# Patient Record
Sex: Female | Born: 1935 | Race: White | Hispanic: No | Marital: Married | Smoking: Never smoker
Health system: Southern US, Community
[De-identification: ages and names within clinical notes are randomized; demographics above are authoritative.]

## PROBLEM LIST (undated history)

## (undated) DIAGNOSIS — R040 Epistaxis: Secondary | ICD-10-CM

## (undated) DIAGNOSIS — F39 Unspecified mood [affective] disorder: Secondary | ICD-10-CM

## (undated) DIAGNOSIS — I82401 Acute embolism and thrombosis of unspecified deep veins of right lower extremity: Secondary | ICD-10-CM

## (undated) DIAGNOSIS — G4730 Sleep apnea, unspecified: Secondary | ICD-10-CM

## (undated) DIAGNOSIS — T4145XA Adverse effect of unspecified anesthetic, initial encounter: Secondary | ICD-10-CM

## (undated) DIAGNOSIS — M6281 Muscle weakness (generalized): Secondary | ICD-10-CM

## (undated) DIAGNOSIS — I674 Hypertensive encephalopathy: Principal | ICD-10-CM

## (undated) DIAGNOSIS — I10 Essential (primary) hypertension: Secondary | ICD-10-CM

## (undated) DIAGNOSIS — T8859XA Other complications of anesthesia, initial encounter: Secondary | ICD-10-CM

## (undated) DIAGNOSIS — K7581 Nonalcoholic steatohepatitis (NASH): Secondary | ICD-10-CM

## (undated) DIAGNOSIS — M419 Scoliosis, unspecified: Secondary | ICD-10-CM

## (undated) DIAGNOSIS — R51 Headache: Secondary | ICD-10-CM

## (undated) DIAGNOSIS — I739 Peripheral vascular disease, unspecified: Secondary | ICD-10-CM

## (undated) DIAGNOSIS — H9192 Unspecified hearing loss, left ear: Secondary | ICD-10-CM

## (undated) DIAGNOSIS — G8929 Other chronic pain: Secondary | ICD-10-CM

## (undated) DIAGNOSIS — G4700 Insomnia, unspecified: Secondary | ICD-10-CM

## (undated) DIAGNOSIS — K219 Gastro-esophageal reflux disease without esophagitis: Secondary | ICD-10-CM

## (undated) DIAGNOSIS — I25119 Atherosclerotic heart disease of native coronary artery with unspecified angina pectoris: Secondary | ICD-10-CM

## (undated) DIAGNOSIS — R0602 Shortness of breath: Secondary | ICD-10-CM

## (undated) DIAGNOSIS — I639 Cerebral infarction, unspecified: Secondary | ICD-10-CM

## (undated) DIAGNOSIS — F419 Anxiety disorder, unspecified: Secondary | ICD-10-CM

## (undated) DIAGNOSIS — M549 Dorsalgia, unspecified: Secondary | ICD-10-CM

## (undated) DIAGNOSIS — H9191 Unspecified hearing loss, right ear: Secondary | ICD-10-CM

## (undated) DIAGNOSIS — K7460 Unspecified cirrhosis of liver: Secondary | ICD-10-CM

## (undated) DIAGNOSIS — G43909 Migraine, unspecified, not intractable, without status migrainosus: Secondary | ICD-10-CM

## (undated) DIAGNOSIS — Z8619 Personal history of other infectious and parasitic diseases: Secondary | ICD-10-CM

## (undated) DIAGNOSIS — F600 Paranoid personality disorder: Secondary | ICD-10-CM

## (undated) DIAGNOSIS — N289 Disorder of kidney and ureter, unspecified: Secondary | ICD-10-CM

## (undated) DIAGNOSIS — R0609 Other forms of dyspnea: Secondary | ICD-10-CM

## (undated) DIAGNOSIS — M47816 Spondylosis without myelopathy or radiculopathy, lumbar region: Secondary | ICD-10-CM

## (undated) DIAGNOSIS — R1310 Dysphagia, unspecified: Secondary | ICD-10-CM

## (undated) DIAGNOSIS — K759 Inflammatory liver disease, unspecified: Secondary | ICD-10-CM

## (undated) DIAGNOSIS — I671 Cerebral aneurysm, nonruptured: Secondary | ICD-10-CM

## (undated) DIAGNOSIS — R0600 Dyspnea, unspecified: Secondary | ICD-10-CM

## (undated) DIAGNOSIS — Z8719 Personal history of other diseases of the digestive system: Secondary | ICD-10-CM

## (undated) DIAGNOSIS — H47099 Other disorders of optic nerve, not elsewhere classified, unspecified eye: Secondary | ICD-10-CM

## (undated) DIAGNOSIS — R440 Auditory hallucinations: Secondary | ICD-10-CM

## (undated) DIAGNOSIS — F1320 Sedative, hypnotic or anxiolytic dependence, uncomplicated: Secondary | ICD-10-CM

## (undated) DIAGNOSIS — Z950 Presence of cardiac pacemaker: Secondary | ICD-10-CM

## (undated) HISTORY — PX: KNEE ARTHROSCOPY: SHX127

---
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
## 1968-12-07 DIAGNOSIS — I82401 Acute embolism and thrombosis of unspecified deep veins of right lower extremity: Secondary | ICD-10-CM

## 1968-12-07 HISTORY — DX: Acute embolism and thrombosis of unspecified deep veins of right lower extremity: I82.401

## 1968-12-07 HISTORY — PX: DILATION AND CURETTAGE OF UTERUS: SHX78

## 1978-12-08 HISTORY — PX: VAGINAL HYSTERECTOMY: SUR661

## 2004-02-24 ENCOUNTER — Ambulatory Visit: Payer: Self-pay | Admitting: Family Medicine

## 2004-04-05 ENCOUNTER — Ambulatory Visit: Payer: Self-pay | Admitting: *Deleted

## 2004-05-01 ENCOUNTER — Ambulatory Visit: Payer: Self-pay | Admitting: Family Medicine

## 2004-06-26 ENCOUNTER — Ambulatory Visit: Payer: Self-pay | Admitting: Family Medicine

## 2004-07-04 ENCOUNTER — Ambulatory Visit: Payer: Self-pay | Admitting: Family Medicine

## 2004-08-20 ENCOUNTER — Ambulatory Visit: Payer: Self-pay | Admitting: Family Medicine

## 2004-11-05 ENCOUNTER — Ambulatory Visit: Payer: Self-pay | Admitting: Family Medicine

## 2005-01-01 ENCOUNTER — Ambulatory Visit: Payer: Self-pay | Admitting: Family Medicine

## 2005-01-25 ENCOUNTER — Ambulatory Visit: Payer: Self-pay | Admitting: Family Medicine

## 2005-03-12 ENCOUNTER — Ambulatory Visit: Payer: Self-pay | Admitting: Family Medicine

## 2005-07-11 ENCOUNTER — Ambulatory Visit: Payer: Self-pay | Admitting: Family Medicine

## 2005-07-22 ENCOUNTER — Ambulatory Visit: Payer: Self-pay | Admitting: Family Medicine

## 2005-11-28 ENCOUNTER — Ambulatory Visit: Payer: Self-pay | Admitting: Family Medicine

## 2006-01-31 ENCOUNTER — Ambulatory Visit: Payer: Self-pay | Admitting: Family Medicine

## 2006-02-07 ENCOUNTER — Ambulatory Visit: Payer: Self-pay | Admitting: Family Medicine

## 2006-06-25 ENCOUNTER — Ambulatory Visit: Payer: Self-pay | Admitting: Family Medicine

## 2006-07-04 ENCOUNTER — Ambulatory Visit (HOSPITAL_COMMUNITY): Admission: RE | Admit: 2006-07-04 | Discharge: 2006-07-04 | Payer: Self-pay | Admitting: Family Medicine

## 2006-08-11 ENCOUNTER — Ambulatory Visit: Payer: Self-pay | Admitting: Family Medicine

## 2010-05-09 DIAGNOSIS — Z8619 Personal history of other infectious and parasitic diseases: Secondary | ICD-10-CM

## 2010-05-09 HISTORY — DX: Personal history of other infectious and parasitic diseases: Z86.19

## 2010-05-28 ENCOUNTER — Inpatient Hospital Stay (HOSPITAL_COMMUNITY): Payer: Medicare Other

## 2010-05-28 ENCOUNTER — Emergency Department (HOSPITAL_COMMUNITY): Payer: Medicare Other

## 2010-05-28 ENCOUNTER — Inpatient Hospital Stay (HOSPITAL_COMMUNITY)
Admission: EM | Admit: 2010-05-28 | Discharge: 2010-06-01 | DRG: 305 | Disposition: A | Discharge status: Home / Self Care | Payer: Medicare Other | Attending: Family Medicine | Admitting: Family Medicine

## 2010-05-28 ENCOUNTER — Encounter (HOSPITAL_COMMUNITY): Payer: Self-pay

## 2010-05-28 DIAGNOSIS — G8929 Other chronic pain: Secondary | ICD-10-CM | POA: Diagnosis present

## 2010-05-28 DIAGNOSIS — R350 Frequency of micturition: Secondary | ICD-10-CM | POA: Diagnosis present

## 2010-05-28 DIAGNOSIS — R112 Nausea with vomiting, unspecified: Secondary | ICD-10-CM | POA: Diagnosis present

## 2010-05-28 DIAGNOSIS — I10 Essential (primary) hypertension: Principal | ICD-10-CM | POA: Diagnosis present

## 2010-05-28 DIAGNOSIS — K219 Gastro-esophageal reflux disease without esophagitis: Secondary | ICD-10-CM | POA: Diagnosis present

## 2010-05-28 DIAGNOSIS — B029 Zoster without complications: Secondary | ICD-10-CM | POA: Diagnosis present

## 2010-05-28 DIAGNOSIS — M545 Low back pain, unspecified: Secondary | ICD-10-CM | POA: Diagnosis present

## 2010-05-28 HISTORY — DX: Other chronic pain: G89.29

## 2010-05-28 HISTORY — DX: Dorsalgia, unspecified: M54.9

## 2010-05-28 HISTORY — DX: Essential (primary) hypertension: I10

## 2010-05-28 LAB — URINALYSIS, ROUTINE W REFLEX MICROSCOPIC
Bilirubin Urine: NEGATIVE
Hgb urine dipstick: NEGATIVE
Ketones, ur: NEGATIVE mg/dL
Urine Glucose, Fasting: NEGATIVE mg/dL
Urobilinogen, UA: 0.2 mg/dL (ref 0.0–1.0)
pH: 7.5 (ref 5.0–8.0)

## 2010-05-28 LAB — URINE MICROSCOPIC-ADD ON

## 2010-05-28 LAB — BASIC METABOLIC PANEL
Glucose, Bld: 118 mg/dL — ABNORMAL HIGH (ref 70–99)
Potassium: 4.2 mEq/L (ref 3.5–5.1)
Sodium: 138 mEq/L (ref 135–145)

## 2010-05-28 LAB — HEPATIC FUNCTION PANEL
AST: 29 U/L (ref 0–37)
Alkaline Phosphatase: 78 U/L (ref 39–117)
Indirect Bilirubin: 1.4 mg/dL — ABNORMAL HIGH (ref 0.3–0.9)

## 2010-05-28 LAB — CBC
HCT: 44.8 % (ref 36.0–46.0)
MCHC: 33 g/dL (ref 30.0–36.0)
RDW: 14.2 % (ref 11.5–15.5)

## 2010-05-28 LAB — APTT: aPTT: 30 seconds (ref 24–37)

## 2010-05-28 LAB — MRSA PCR SCREENING: MRSA by PCR: NEGATIVE

## 2010-05-29 LAB — CBC
HCT: 38.2 % (ref 36.0–46.0)
Hemoglobin: 12.4 g/dL (ref 12.0–15.0)
MCHC: 32.5 g/dL (ref 30.0–36.0)
MCV: 87.2 fL (ref 78.0–100.0)
RBC: 4.38 MIL/uL (ref 3.87–5.11)

## 2010-05-29 LAB — URINE CULTURE

## 2010-05-29 LAB — COMPREHENSIVE METABOLIC PANEL
ALT: 14 U/L (ref 0–35)
AST: 30 U/L (ref 0–37)
CO2: 24 mEq/L (ref 19–32)
Calcium: 8.4 mg/dL (ref 8.4–10.5)
Chloride: 99 mEq/L (ref 96–112)
Glucose, Bld: 161 mg/dL — ABNORMAL HIGH (ref 70–99)
Potassium: 3.9 mEq/L (ref 3.5–5.1)
Sodium: 136 mEq/L (ref 135–145)

## 2010-05-29 LAB — MAGNESIUM: Magnesium: 2 mg/dL (ref 1.5–2.5)

## 2010-05-29 LAB — LIPID PANEL
HDL: 36 mg/dL — ABNORMAL LOW (ref >39)
Total CHOL/HDL Ratio: 4.1 RATIO

## 2010-05-30 LAB — BASIC METABOLIC PANEL
Calcium: 8.4 mg/dL (ref 8.4–10.5)
Chloride: 101 mEq/L (ref 96–112)
Creatinine, Ser: 1.01 mg/dL (ref 0.4–1.2)
GFR calc non Af Amer: 52 mL/min — ABNORMAL LOW (ref >60)
Potassium: 4.5 mEq/L (ref 3.5–5.1)

## 2010-05-30 LAB — CBC
MCH: 28.5 pg (ref 26.0–34.0)
MCHC: 32.4 g/dL (ref 30.0–36.0)
Platelets: 166 10*3/uL (ref 150–400)
RDW: 14.7 % (ref 11.5–15.5)

## 2010-05-30 LAB — DIFFERENTIAL
Basophils Relative: 1 % (ref 0–1)
Monocytes Relative: 13 % — ABNORMAL HIGH (ref 3–12)
Neutro Abs: 3.2 10*3/uL (ref 1.7–7.7)
Neutrophils Relative %: 64 % (ref 43–77)

## 2010-06-02 NOTE — H&P (Signed)
NAMESYD, MANGES                ACCOUNT NO.:  [id number]  MEDICAL RECORD NO.:  [id number]           PATIENT TYPE:  E  LOCATION:  [facility]                         FACILITY:  [facility]  PHYSICIAN:  Vania Rea, M.D. DATE OF BIRTH:  1926/03/03  DATE OF ADMISSION:  05/28/2010 DATE OF DISCHARGE:                             HISTORY & PHYSICAL   PRIMARY CARE PHYSICIAN:  Delaney Meigs, M.D.  CHIEF COMPLAINT:  Worsening back pain, nausea and vomiting for the past 3 weeks.  HISTORY OF PRESENT ILLNESS:  This is an 77 year old, Caucasian lady with a history of hypertension who last saw her primary care physician about 3 months ago and as far as she knows her blood pressure is well- controlled.  However, the patient does have some chronic medical problems including cirrhosis thought to be triggered by Zestril some many years ago.  Also for the past 12-18 months has been having a chronic back pain associated episodic nausea and vomiting, which she says has been getting much worse for the past 3 weeks.  In particular, the flank pain has been worse for the past 3 weeks, and for the past 2 days, she has had a rash in the right lumbar area and it is now spreading anteriorly.  She has been self-medicated with topical lidocaine and other soothing preparations.  For the past 3 days, she has not been eating, has been getting weaker having episodic pain, and has been dizzy when trying to get to the bathroom so has been spending most of her time in bed.  Eventually, her daughter and granddaughter decided to bring her to the hospital to be evaluated.  The patient did not take her blood pressure medication today, and in the emergency room her blood pressures have been noted to be running as high as 260/154, and despite administration of labetalol, her blood pressures remained persistently elevated, and after initial imaging of her back, the Hospitalist Service was called to assist with management.   The patient denies any fever, cough or cold.  She denies any chest pain.  She does have epigastric pain.  She does have nausea, anorexia and vomiting.  He does have a strong family history of cancers.  She reports back pain has been confined to her right side.  She denies any pain or numbness in the lower extremity.  The patient has been prescribed Robaxin to assist with her pain. However, she takes it very infrequently because she is afraid of taking medications.  She has been taking Tylenol 650 mg every 8 hours for the past 3 days to try to alleviate her back pain.  She has been taking no other pain medications.  PAST MEDICAL HISTORY: 1. Hypertension. 2. Chronic back pain. 3. 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 4. Remote history of deep venous thrombosis, right leg. 5. History of congenital cleft palate with subsequent impaired speech     declining surgery. 6. History of mastoid surgery and apparent removal of the left eardrum     in her 20s due to chronic ear  disease.  MEDICATIONS: 1. Prazosin 2 mg daily, last used yesterday. 2. Propranolol 160 mg daily, last used yesterday. 3. Robaxin 500 mg rarely. 4. Tylenol 650 mg every 8 hours for past 3 days. 5. Iron tablets daily.  ALLERGIES:  ZESTRIL is thought to have precipitated liver cirrhosis.  SOCIAL HISTORY:  Denies tobacco, alcohol or illicit drug use.  Lives with her family.  Her daughter and granddaughter are present. Her granddaughter is Governor Specking who is her healthcare power of attorney. Her telephone number is [phone].  Her code status is a full code.  FAMILY HISTORY:  Significant for 10 siblings with multiple family members with a gynecological cancers including her mother who had severe osteoporosis.  Her father had severe hypertension.  Some sisters also had diabetes.  She had a brother with prostate cancer.  REVIEW OF SYSTEMS:  Other than  noted above, a complete review of systems is unremarkable.  PHYSICAL EXAM:  Elderly, Caucasian lady lying on the stretcher, sitting up frequently to retch into a barf bag. VITAL SIGNS:  Her temperature is 96.5.  Her pulse is 81, blood pressure 227/122, respiratory rate is 18. HEENT:  Her pupils are round equal reactive.  Mucous membranes pink and anicteric.  No cervical lymphadenopathy or thyromegaly.  No carotid bruit. CHEST:  Clear to auscultation bilaterally. CARDIOVASCULAR SYSTEM:  Regular rhythm.  No murmur heard. ABDOMEN:  Mildly obese, soft.  No tenderness elicited. EXTREMITIES:  Without edema.  She has 2+ dorsalis pedis pulses. SKIN:  She has a microvesicular rash on the right flank and also anteriorly in the region of about T11.  It has an erythematous base. She has no flank tenderness.  She has no spinal tenderness.  She is tender in the right hip.  CENTRAL NERVOUS SYSTEM:  She has no facial asymmetry.  She has power equal in all extremities.  LABORATORY DATA:  Her white count is 7.3, hemoglobin 14.8, platelets 121.  Her sodium is 138, potassium 4.3, chloride 97, CO2 31, glucose 118, BUN 17, creatinine 0.74, calcium 9.5.  Liver functions are pending. Urinalysis is pending.  Coags are pending.  CT of the head shows atrophy and microvascular white matter disease.  No acute intracranial abnormality.  She has a scalp lipoma.  There are changes of sinusitis, especially in the left sphenoid sinus, which is subtotally opacified. Plain x-rays of the lumbar spine show rotoscoliosis with degenerative disk disease at L3-L4 with retrolisthesis, which might cause spinal stenosis.  ASSESSMENT: 1. Hypertensive urgency, possibly aggravated by skipping medications. 2. Persistent vomiting. 3. Chronic back pains. 4. Shingles. 5. Strong family history of cancers.  PLAN: 1. We will admit this lady to the step-down unit for control of her     blood pressure.  We will give empiric  medications for nausea and     vomiting.  We will get an ultrasound of her upper quadrant to rule     out gallbladder disease and to further assess her liver.  We will     check her liver function tests to see if deranged liver functions     are contributing to her nausea and vomiting.  However, she is not     jaundiced. 2. We will start treatment for her shingles, although she has had it     for a couple days, and will give consideration to some type of     malignancy, which is triggering this attack of shingles. 3. Other plans as per orders.  Vania Rea, M.D.     LC/MEDQ  D:  05/28/2010  T:  05/28/2010  Job:  [id number]  cc:   Delaney Meigs, M.D. Fax: [phone]  Electronically Signed by Vania Rea M.D. on 06/02/2010 05:04:01 AM

## 2010-06-14 NOTE — Discharge Summary (Signed)
[name], [name]                ACCOUNT NO.:  [id number]  MEDICAL RECORD NO.:  [id number]           PATIENT TYPE:  I  LOCATION:  5003                         FACILITY:  [facility]  PHYSICIAN:  Brendia Sacks, MD    DATE OF BIRTH:  Feb 10, 1926  DATE OF ADMISSION:  05/28/2010 DATE OF DISCHARGE:  06/01/2010                              DISCHARGE SUMMARY   CONDITION ON DISCHARGE:  Improved.  PRIMARY CARE PHYSICIAN:  Delaney Meigs, MD  DISCHARGE DIAGNOSES: 1. Malignant hypertension, now stable. 2. Cyclical nausea and vomiting, resolved. 3. Chronic low back and right hip pain. 4. Acute shingles 5. Urinary frequency without hesitancy and negative bladder scan. 6. Daily gastroesophageal reflux disease.  HISTORY OF PRESENT ILLNESS:  This is an 77 year old woman who presented to the emergency room with a history of chronic back pain that had worsened in her flank over the last several days.  Two days prior to admission, she developed a rash over her right abdomen.  In the emergency room, she was noted be markedly hypertensive and so was admitted to Step-down Unit for malignant hypertension.  HOSPITAL COURSE: 1. Malignant hypertension.  The patient was admitted, treated with     medications, blood pressure rapidly improved.  She has had no     obvious sequela and her blood pressure now appears to be stable.     She will continue on her chronic agents with the addition of     hydralazine. 2. Cyclical nausea and vomiting.  This has resolved.  Probably     secondary to her acute shingles as well as hypertension. 3. Chronic low back pain and right hip pain.  Films were performed of     the lumbar spine, which did indicate degenerative disk disease and     possible spinal stenosis.  This could be further investigated in     the outpatient setting.  She was evaluated by Physical and     Occupational Therapy and has been recommended she have home health     physical and occupational  therapy.  She does have 24-hour care at     home at this point. 4. Acute shingles in one dermatome.  Lesions are now crusted over.     She will complete a 7-day course of acyclovir.  I have advised her     that the rashes likely take some time to resolve and that she may     have some continued neuropathic pain in that. 5. Urinary frequency without hesitancy.  No evidence of urinary tract     infection on this admission.  Culture was negative.  Bladder scan     was negative for retention.  This could be followed in the     outpatient setting. 6. GERD.  The patient can eat "whole bottle of Tums a day."  She has     had GERD for many years and continues to complain of this.  I  have     placed her on Pepcid.  Consider further evaluation and treatment as     directed by her primary care physician.  CONSULTATIONS:  None.  PROCEDURES:  None.  IMAGING: 1. Lumbar spine x-ray on May 28, 2010:  Rotoscoliosis of the     lumbar spine with degenerative disk disease at L3-4 with     retrolisthesis, which might cause spinal stenosis. 2. CT of the head on May 28, 2010:  Atrophy and microvascular     white matter disease.  No acute intracranial abnormality. 3. Abdominal ultrasound on May 28, 2010:  Negative abdominal     ultrasound. 4. Chest x-ray one-view on May 28, 2010:  No acute     cardiopulmonary disease.  Atherosclerosis.  MICROBIOLOGY: 1. Urine culture on May 28, 2010:  15,000 colonies per     milliliter, multiple bacterial morphotypes present, none     predominant. 2. CBC, unremarkable. 3. Basic metabolic panel, unremarkable. 4. Liver function panel, essentially unremarkable on discharge.  PHYSICAL EXAMINATION:  Exam on discharge, the patient is feeling well. No complaints.  She did have some high blood pressure over 95, her systolic blood pressures is now on the 130s, said she feels well. CARDIOVASCULAR:  Regular rate and rhythm.  No murmur, rub or  gallop. RESPIRATORY:  Clear to auscultation bilaterally.  No wheezes, rales or rhonchi.  Normal respiratory effort.  No lower extremity edema.  The fascicular rash over her abdomen remains within one dermatome, lesions are crusted over.  DISCHARGE INSTRUCTIONS:  The patient will be discharged home today with physical and occupational therapy.  Low-sodium heart-healthy diet. Increase activity slowly and walk with assistance.  Follow up with Dr. Joette Catching on June 05, 2010, as you already have scheduled.  DISCHARGE MEDICATIONS: 1. Acyclovir 800 mg p.o. 5 times a day to complete a total of a 7-day     course. 2. Hydralazine 25 mg p.o. q.i.d. 3. Pepcid 20 mg p.o. b.i.d. 4. Prazosin 2 mg p.o. daily. 5. Propranolol SR 160 mg p.o. daily. 6. Robaxin 500 mg p.o. b.i.d. as needed for pain. 7. Tandem Plus multivitamin p.o. daily. 8. Acetaminophen 325 mg 2 tablets every 6 hours as needed for pain.  TIME COORDINATING DISCHARGE:  40 minutes.     Brendia Sacks, MD    DG/MEDQ  D:  06/01/2010  T:  06/02/2010  Job:  [id number]  cc:   Delaney Meigs, M.D.  Electronically Signed by Brendia Sacks  on 06/13/2010 09:03:49 PM

## 2010-07-31 ENCOUNTER — Inpatient Hospital Stay (HOSPITAL_COMMUNITY)
Admission: EM | Admit: 2010-07-31 | Discharge: 2010-08-04 | DRG: 201 | Disposition: A | Discharge status: Skilled Nursing Facility | Payer: Medicare Other | Attending: Surgery | Admitting: Surgery

## 2010-07-31 ENCOUNTER — Emergency Department (HOSPITAL_COMMUNITY): Payer: Medicare Other

## 2010-07-31 DIAGNOSIS — S270XXA Traumatic pneumothorax, initial encounter: Principal | ICD-10-CM | POA: Diagnosis present

## 2010-07-31 DIAGNOSIS — G8929 Other chronic pain: Secondary | ICD-10-CM | POA: Diagnosis present

## 2010-07-31 DIAGNOSIS — Z86718 Personal history of other venous thrombosis and embolism: Secondary | ICD-10-CM

## 2010-07-31 DIAGNOSIS — Z7982 Long term (current) use of aspirin: Secondary | ICD-10-CM

## 2010-07-31 DIAGNOSIS — K7460 Unspecified cirrhosis of liver: Secondary | ICD-10-CM | POA: Diagnosis present

## 2010-07-31 DIAGNOSIS — I10 Essential (primary) hypertension: Secondary | ICD-10-CM | POA: Diagnosis present

## 2010-07-31 DIAGNOSIS — W1809XA Striking against other object with subsequent fall, initial encounter: Secondary | ICD-10-CM | POA: Diagnosis present

## 2010-07-31 DIAGNOSIS — Y92009 Unspecified place in unspecified non-institutional (private) residence as the place of occurrence of the external cause: Secondary | ICD-10-CM

## 2010-07-31 DIAGNOSIS — Z8249 Family history of ischemic heart disease and other diseases of the circulatory system: Secondary | ICD-10-CM

## 2010-07-31 DIAGNOSIS — Z79899 Other long term (current) drug therapy: Secondary | ICD-10-CM

## 2010-07-31 DIAGNOSIS — Z833 Family history of diabetes mellitus: Secondary | ICD-10-CM

## 2010-07-31 DIAGNOSIS — M25569 Pain in unspecified knee: Secondary | ICD-10-CM | POA: Diagnosis present

## 2010-07-31 DIAGNOSIS — W010XXA Fall on same level from slipping, tripping and stumbling without subsequent striking against object, initial encounter: Secondary | ICD-10-CM | POA: Diagnosis present

## 2010-07-31 DIAGNOSIS — K219 Gastro-esophageal reflux disease without esophagitis: Secondary | ICD-10-CM | POA: Diagnosis present

## 2010-07-31 DIAGNOSIS — R5381 Other malaise: Secondary | ICD-10-CM | POA: Diagnosis present

## 2010-07-31 DIAGNOSIS — M25559 Pain in unspecified hip: Secondary | ICD-10-CM | POA: Diagnosis present

## 2010-07-31 DIAGNOSIS — M549 Dorsalgia, unspecified: Secondary | ICD-10-CM | POA: Diagnosis present

## 2010-07-31 DIAGNOSIS — M25579 Pain in unspecified ankle and joints of unspecified foot: Secondary | ICD-10-CM | POA: Diagnosis present

## 2010-07-31 LAB — TYPE AND SCREEN
ABO/RH(D): A POS
Antibody Screen: NEGATIVE

## 2010-07-31 LAB — COMPREHENSIVE METABOLIC PANEL
Alkaline Phosphatase: 70 U/L (ref 39–117)
BUN: 28 mg/dL — ABNORMAL HIGH (ref 6–23)
Calcium: 8.9 mg/dL (ref 8.4–10.5)
Glucose, Bld: 124 mg/dL — ABNORMAL HIGH (ref 70–99)
Total Protein: 6.6 g/dL (ref 6.0–8.3)

## 2010-07-31 LAB — DIFFERENTIAL
Basophils Absolute: 0 10*3/uL (ref 0.0–0.1)
Basophils Relative: 0 % (ref 0–1)
Eosinophils Relative: 3 % (ref 0–5)
Monocytes Absolute: 1 10*3/uL (ref 0.1–1.0)

## 2010-07-31 LAB — CBC
MCHC: 33.9 g/dL (ref 30.0–36.0)
Platelets: 208 10*3/uL (ref 150–400)
RDW: 14.6 % (ref 11.5–15.5)

## 2010-07-31 LAB — ABO/RH: ABO/RH(D): A POS

## 2010-08-01 ENCOUNTER — Inpatient Hospital Stay (HOSPITAL_COMMUNITY): Payer: Medicare Other

## 2010-08-01 LAB — CK TOTAL AND CKMB (NOT AT ARMC)
CK, MB: 1.1 ng/mL (ref 0.3–4.0)
Relative Index: INVALID (ref 0.0–2.5)

## 2010-08-01 LAB — BASIC METABOLIC PANEL
BUN: 25 mg/dL — ABNORMAL HIGH (ref 6–23)
Chloride: 101 mEq/L (ref 96–112)
Creatinine, Ser: 0.94 mg/dL (ref 0.4–1.2)
Glucose, Bld: 152 mg/dL — ABNORMAL HIGH (ref 70–99)

## 2010-08-01 LAB — GLUCOSE, CAPILLARY: Glucose-Capillary: 116 mg/dL — ABNORMAL HIGH (ref 70–99)

## 2010-08-01 LAB — TROPONIN I: Troponin I: 0.03 ng/mL (ref 0.00–0.06)

## 2010-08-01 LAB — CBC
MCH: 30 pg (ref 26.0–34.0)
MCV: 87.5 fL (ref 78.0–100.0)
Platelets: 207 10*3/uL (ref 150–400)
RBC: 4.17 MIL/uL (ref 3.87–5.11)

## 2010-08-09 NOTE — Consult Note (Signed)
[name], [name]                ACCOUNT NO.:  [id number]  MEDICAL RECORD NO.:  [id number]           PATIENT TYPE:  I  LOCATION:  2103                         FACILITY:  [facility]  PHYSICIAN:  Jeoffrey Massed, MD    DATE OF BIRTH:  Feb 28, 1926  DATE OF CONSULTATION:  08/01/2010 DATE OF DISCHARGE:                                CONSULTATION   PRIMARY CARE PRACTITIONER:  Delaney Meigs, MD  REQUESTING PHYSICIAN:  Dr. Lindie Spruce.  REASON FOR CONSULTATION:  Uncontrolled, but labile hypertension.  HISTORY OF PRESENT ILLNESS:  The patient is an 77 year old female with longstanding history of hypertension, history of underlying fatty liver, gastroesophageal reflux disease, history of cleft palate, and decreased hearing of the left ear with a remote history of right leg DVT, not on any chronic anticoagulation therapy, who was brought in to the trauma service yesterday and she fell.  Upon further evaluation in the ED, it was thought that the patient had a pneumothorax.  Her blood pressure was also elevated.  She was then admitted to the Trauma Service for further evaluation and treatment.  During hospital course, it was noted that her blood pressure has been labile and hospitalist service has now been asked to assist in management of this.  Please do know that the fall that the patient sustained yesterday was a mechanical fall and was not a syncopal episode.  The patient denies any chest pain or any current headache.  There is also no history of dizziness.  The patient does not get lightheaded when standing.  There is no history of nausea, vomiting, or diarrhea.  ALLERGIES:  PENICILLIN and ZESTRIL.  PAST MEDICAL HISTORY: 1. Hypertension. 2. Gastroesophageal reflux disease. 3. History of shingles. 4. History of fatty liver, doubt history of cryptogenic liver     cirrhosis. 5. Cleft palate. 6. History of decreased hearing on the left ear. 7. Remote history of right leg venous  thrombosis.  PAST SURGICAL HISTORY:  Mastoid surgery.  MEDICATIONS:  Prior to admission include the following; 1. Tandem plus 106 mg 1 tablet daily. 2. Docusate 100 mg twice daily. 3. Neurontin 100 mg 1 tablet at bedtime. 4. Neurontin 300 mg 2 tablets p.o. at bedtime. 5. Aspirin 81 mg 1 tablet daily at bedtime. 6. Propranolol SR 160 mg 1 tablet daily. 7. Prazosin 2 mg 1 tablet twice daily. 8. Pepcid 20 mg 1 tablet twice daily. 9. Hydralazine 25 mg four times a day.  FAMILY HISTORY:  Positive for diabetes and hypertension.  REVIEW OF SYSTEMS:  A detailed review of 12 systems were done and these are negative except for the ones mentioned in the HPI.  SOCIAL HISTORY:  Denies any drugs, alcohol, or tobacco abuse.  PHYSICAL EXAMINATION:  VITAL SIGNS:  Review of the blood pressure currently for the past few hours shows that it is mostly sustained with a systolic between 784 to 140s with diastolic also being well controlled.  Further review with the RN, apparently they have been 2 episodes where the systolic blood pressure was in the 90s.  Afebrile. Heart rate of 78.  O2 saturation of 99% on 4  L. GENERAL:  Lying in bed, does not appear to be in any distress. CHEST:  Bilaterally clear to auscultation. CARDIOVASCULAR:  Heart sounds are regular.  No murmurs heard. ABDOMEN:  Soft, nontender, and nondistended. EXTREMITIES:  No edema. NEUROLOGIC:  The patient is alert and awake, and is able to move all of her 4 extremities with no obvious or focal neurological deficits.  LABORATORY DATA: 1. CBC done today shows a WBC of 8.7, hemoglobin of 12.5, and a     platelet count of 207. 2. Chemistry shows sodium of 138, potassium of 3.8, chloride of 101,     bicarb of 24, glucose of 152, BUN of 25, creatinine of 0.91, and     calcium of 9.1. 3. Troponin is 0.03.  RADIOLOGICAL STUDIES:  Chest x-ray 2-view done on August 01, 2010, shows no pneumothorax.  In retrospect, the suspected pneumothorax  in the prior study represents skin fold.  IMPRESSION: 1. Labile hypertension. 2. Mechanical fall with no evidence of syncope. 3. Gastroesophageal reflux disease, stable. 4. Neuropathy, stable.  SUGGESTIONS: 1. At this time, it is our recommendation that this patient continue     to be on a current antihypertensive regimen, since most of her BP     readings are actually stable.  We will continue to observe her     overnight with the current medications.  We also agreed with the     current holding parameters for antihypertensive medications.  Even     though apparently her systolic blood pressure was in the 90s, the     patient was asymptomatic.  We will continue to follow this patient     and make further recommendations depending on the trend of her     blood pressure.  This patient does apparently have neuropathy and     could have underlying autonomic neuropathy contributing to this as     well. 2. Since this fall was mechanical and there is no evidence of a     syncopal episode, no need for further syncope workup. 3. Rest of the issues are being handled by the primary team.  Thank you for the consult and we will continue to follow this patient with you, and we will make further recommendations as well.  TOTAL TIME SPENT:  45 minutes.     Jeoffrey Massed, MD     SG/MEDQ  D:  08/01/2010  T:  08/01/2010  Job:  [id number]  cc:   Delaney Meigs, M.D.  Electronically Signed by Jeoffrey Massed  on 08/09/2010 03:38:17 PM

## 2010-08-15 NOTE — H&P (Signed)
NAMEARLENE, BRICKEL                ACCOUNT NO.:  [id number]  MEDICAL RECORD NO.:  [id number]           PATIENT TYPE:  LOCATION:                                 FACILITY:  PHYSICIAN:  Mary Sella. Andrey Campanile, MD     DATE OF BIRTH:  1925/12/03  DATE OF ADMISSION: DATE OF DISCHARGE:                             HISTORY & PHYSICAL   PRIMARY CARE PHYSICIAN:  Delaney Meigs, MD  CHIEF COMPLAINT:  "I fell."  HISTORY OF PRESENT ILLNESS:  Ms. Bonini is a very pleasant 77 year old female who fell earlier this evening.  She states that she slipped and fell landing on her right side.  This happened around 6 cm.  She immediately had right hip, knee, and ankle pain.  She was brought to the emergency room around 10 o'clock secondary to continued pain.  She denies loss of conscience.  She denies any headaches, neck pain, or vision changes.  She denies any chest pain.  She said it hurts a little bit on the right side when she takes a deep breath.  She denies any vision changes, abdominal pain, left lower extremity, left upper extremity, or right upper extremity discomfort.  She denies any numbness or tingling in her upper extremities.  PAST MEDICAL HISTORY: 1. Hypertension. 2. Gastroesophageal reflux disease. 3. History of shingles. 4. History of cryptogenic liver cirrhosis. 5. Remote history of right leg deep venous thrombosis. 6. Cleft palate 7. Decreased hearing, left ear.  PAST SURGICAL HISTORY:  Mastoid surgery.  ALLERGIES:  PENICILLIN and Zestril.  MEDICATIONS: 1. Hydralazine 25 q.i.d. 2. Prazosin 2 mg daily. 3. Propranolol SR 100 mg daily. 4. Pepcid.  SOCIAL HISTORY:  Denies drugs, alcohol, or tobacco.  FAMILY HISTORY:  There is family history of gynecological cancers as well as hypertension, diabetes mellitus, and prostate cancer.  REVIEW OF SYSTEMS:  A comprehensive 12-point review of systems is performed.  All systems are negative except what is mentioned in the HPI.  She does  sound like she had some chronic right hip pain.  PHYSICAL EXAMINATION:  VITAL SIGNS:  Temperature 98.2, pulse of 97, respirations 16, blood pressure on admission 231/108, currently 200/100, sating 93% on room air. HEENT:  Atraumatic, normocephalic.  Pupils are equal and round. Extraocular muscles are intact.  Positive glasses.  No periorbital ecchymosis.  Vision is grossly intact.  TMs are clear.  She does have decreased hearing on the left.  Auricles without lesion.  Face, no lesions, edema, or ecchymosis.  No obvious oral trauma or malocclusion. She does have a cleft palate and atypical speech. NECK:  Nontender without lesions.  Full range of motion is grossly intact without pain. PULMONARY:  Lungs are clear to auscultation.  There is symmetric chest rise.  There is no full chest. CARDIOVASCULAR:  Regular rhythm.  Radial and femoral pulses 2+. ABDOMEN:  Soft, nontender, nondistended. PELVIS:  Stable.  External genitalia without abnormality. MUSCULOSKELETAL:  She is able to grossly move all extremities.  She does have what appears to be a joint effusion in the right knee.  The right knee is larger than the left.  There  are no obvious signs of external trauma to the right lower extremity or any other extremities.  She does have some tenderness on palpation of the right knee.  She is able to plantar and dorsiflex on the left.  She has got good hand grip strength bilaterally.  Gross sensation is intact. BACK:  No lesions, tenderness to bony step-offs. NEUROLOGIC:  GCS of 15.  She is oriented x3.  LABORATORY:  Sodium 136, potassium 3.7, chloride 106, bicarb 27, BUN 28, creatinine 0.8, blood sugar 124, calcium 8.9.  White blood cell count 7.6, hemoglobin 12.5, hematocrit 36.9, platelet count 208.  Total bilirubin 0.6, AST 20, ALT 15, alk phos 70.  RADIOGRAPHS: 1. Chest x-ray showed 20% right pneumothorax.  No effusion or rib     fractures. 2. Right hip film negative for fracture or  bilateral hip     osteoarthritis. 3. Right ankle series negative. 4. Right knee series negative for fracture, small-to-moderate joint     effusion, and positive degenerative joint disease.  IMPRESSION:  An 77 year old Caucasian female status post fall. 1. Right 20% pneumothorax. 2. Bilateral hip osteoarthritis. 3. Hypertensive urgency. 4. Chronic back pain. 5. Right knee pain with effusion. 6. Right hip pain. 7. Gastroesophageal reflux disease. 8. Cleft palate. 9. Cryptogenic liver cirrhosis.  PLAN:  We are going to admit her to the step-down unit secondary to her pneumothorax and hypertensive urgency.  She had an admission back in late February for hypertensive urgency.  We are going to place her on her home blood pressure medications and we will give IV hydralazine p.r.n. for elevated blood pressure.  I am going to add a set of cardiac markers.  If her blood pressure remains elevated despite her home medications, we consult Hospitalist Service.  We will place her on sequential compression devices for now.  With respect to the pneumothorax with her on room air during my interview and exam, she maintained a respiratory rate of 16 and O2 sat of 100% on room air and her breathing was nonlabored.  She had bilateral breath sounds bilaterally.  So therefore, I will hold on the chest tube for now.  We will place her in the step-down unit.  We will repeat the chest x-ray in the morning.  We will keep her on high-flow oxygen.  If her pneumothorax worsens or if she becomes symptomatic, we will place the chest tube versus an interventionally- guided placed pigtail catheter.  If her right knee pain persists, we will consult Orthopedics.  We will also get PT and OT to see her.     Mary Sella. Andrey Campanile, MD     EMW/MEDQ  D:  08/01/2010  T:  08/01/2010  Job:  [id number]  cc:   Delaney Meigs, M.D.  Electronically Signed by Gaynelle Adu M.D. on 08/15/2010 02:57:43 PM

## 2010-08-22 NOTE — Discharge Summary (Signed)
  [name], [name]                ACCOUNT NO.:  [id number]  MEDICAL RECORD NO.:  [id number]           PATIENT TYPE:  I  LOCATION:  5124                         FACILITY:  [facility]  PHYSICIAN:  Wilmon Arms. Corliss Skains, M.D. DATE OF BIRTH:  1926/01/08  DATE OF ADMISSION:  07/31/2010 DATE OF DISCHARGE:  08/04/2010                        DISCHARGE SUMMARY - REFERRING   DISCHARGE DIAGNOSES: 1. Fall. 2. Hypertension. 3. Chronic back pain. 4. Gastroesophageal reflux disease. 5. History of cryptogenic liver cirrhosis. 6. Decreased hearing, left ear. 7. History of deep venous thrombosis. 8. History of shingles.  CONSULTANTS:  Jeoffrey Massed, MD with [facility].  PROCEDURES:  None.  HISTORY OF PRESENT ILLNESS:  This is an 77 year old white female who slipped and fell landing on the right side.  She came in as non-trauma code complaining of hip, knee and ankle pain.  She tufted things out at home but came to the ED in the evening with continued pain.  Her workup was negative for any extremity or pelvic injury.  However, the chest x- ray was read as approximately 20% pneumothorax.  She was admitted for treatment and observation of this pneumothorax.  HOSPITAL COURSE:  The following morning, the chest x-ray showed complete resolution of the pneumothorax.  A close review by the radiologist revealed that the initial chest x-ray was misread.  The patient never had a pneumothorax.  However, her blood pressure was somewhat labile and so Medicine was consulted.  She is having little bit of trouble moving around with the contusion in her right side and so skilled nursing facility for rehab was sought and obtained.  She was able to be transferred there in good condition.  DISCHARGE MEDICATIONS:  The patient may resume all of her home medications which include propranolol SR 160 mg daily, prazosin 2 mg b.i.d., hydralazine 25 mg q.i.d., Pepcid 20 mg b.i.d., aspirin 81  mg enteric-coated nightly, gabapentin 700 mg nightly, docusate 100 mg b.i.d., Tandem plus 106 mg daily in addition, she may use ibuprofen 400 mg every 4 hours as needed for pain.  FOLLOWUP:  The patient will need to follow up with her primary care provider but followup with a Trauma Service will be on an as-needed basis.     Earney Hamburg, P.A.   ______________________________ Wilmon Arms. Corliss Skains, M.D.    MJ/MEDQ  D:  08/04/2010  T:  08/04/2010  Job:  [id number]  Electronically Signed by Charma Igo P.A. on 08/21/2010 03:09:00 PM Electronically Signed by Manus Rudd M.D. on 08/22/2010 07:57:07 AM

## 2010-09-23 ENCOUNTER — Emergency Department (HOSPITAL_COMMUNITY): Payer: Medicare Other

## 2010-09-23 ENCOUNTER — Observation Stay (HOSPITAL_COMMUNITY): Payer: Medicare Other

## 2010-09-23 ENCOUNTER — Inpatient Hospital Stay (HOSPITAL_COMMUNITY)
Admission: EM | Admit: 2010-09-23 | Discharge: 2010-09-26 | DRG: 192 | Disposition: A | Discharge status: Skilled Nursing Facility | Payer: Medicare Other | Attending: Internal Medicine | Admitting: Internal Medicine

## 2010-09-23 DIAGNOSIS — I10 Essential (primary) hypertension: Secondary | ICD-10-CM | POA: Diagnosis present

## 2010-09-23 DIAGNOSIS — Q359 Cleft palate, unspecified: Secondary | ICD-10-CM

## 2010-09-23 DIAGNOSIS — IMO0002 Reserved for concepts with insufficient information to code with codable children: Secondary | ICD-10-CM

## 2010-09-23 DIAGNOSIS — K7460 Unspecified cirrhosis of liver: Secondary | ICD-10-CM | POA: Diagnosis present

## 2010-09-23 DIAGNOSIS — Z9181 History of falling: Secondary | ICD-10-CM

## 2010-09-23 DIAGNOSIS — F0390 Unspecified dementia without behavioral disturbance: Secondary | ICD-10-CM | POA: Diagnosis present

## 2010-09-23 DIAGNOSIS — E440 Moderate protein-calorie malnutrition: Secondary | ICD-10-CM | POA: Diagnosis present

## 2010-09-23 DIAGNOSIS — D649 Anemia, unspecified: Secondary | ICD-10-CM | POA: Diagnosis present

## 2010-09-23 DIAGNOSIS — Z7982 Long term (current) use of aspirin: Secondary | ICD-10-CM

## 2010-09-23 DIAGNOSIS — J441 Chronic obstructive pulmonary disease with (acute) exacerbation: Principal | ICD-10-CM | POA: Diagnosis present

## 2010-09-23 DIAGNOSIS — J0130 Acute sphenoidal sinusitis, unspecified: Secondary | ICD-10-CM | POA: Diagnosis present

## 2010-09-23 DIAGNOSIS — Z86718 Personal history of other venous thrombosis and embolism: Secondary | ICD-10-CM

## 2010-09-23 DIAGNOSIS — H9190 Unspecified hearing loss, unspecified ear: Secondary | ICD-10-CM | POA: Diagnosis present

## 2010-09-23 DIAGNOSIS — R0789 Other chest pain: Secondary | ICD-10-CM | POA: Diagnosis present

## 2010-09-23 DIAGNOSIS — R5381 Other malaise: Secondary | ICD-10-CM | POA: Diagnosis present

## 2010-09-23 DIAGNOSIS — M549 Dorsalgia, unspecified: Secondary | ICD-10-CM | POA: Diagnosis present

## 2010-09-23 DIAGNOSIS — G8929 Other chronic pain: Secondary | ICD-10-CM | POA: Diagnosis present

## 2010-09-23 LAB — BASIC METABOLIC PANEL
CO2: 32 mEq/L (ref 19–32)
Glucose, Bld: 111 mg/dL — ABNORMAL HIGH (ref 70–99)
Potassium: 3.9 mEq/L (ref 3.5–5.1)
Sodium: 138 mEq/L (ref 135–145)

## 2010-09-23 LAB — DIFFERENTIAL
Lymphocytes Relative: 12 % (ref 12–46)
Lymphs Abs: 1.2 10*3/uL (ref 0.7–4.0)
Monocytes Absolute: 1.1 10*3/uL — ABNORMAL HIGH (ref 0.1–1.0)
Monocytes Relative: 11 % (ref 3–12)
Neutro Abs: 7.7 10*3/uL (ref 1.7–7.7)

## 2010-09-23 LAB — CARDIAC PANEL(CRET KIN+CKTOT+MB+TROPI)
CK, MB: 0.9 ng/mL (ref 0.3–4.0)
CK, MB: 1 ng/mL (ref 0.3–4.0)
Relative Index: INVALID (ref 0.0–2.5)
Relative Index: INVALID (ref 0.0–2.5)
Total CK: 29 U/L (ref 7–177)
Total CK: 30 U/L (ref 7–177)
Troponin I: <0.3 ng/mL (ref <0.30)

## 2010-09-23 LAB — PRO B NATRIURETIC PEPTIDE: Pro B Natriuretic peptide (BNP): 515.3 pg/mL — ABNORMAL HIGH (ref 0–450)

## 2010-09-23 LAB — TROPONIN I: Troponin I: <0.3 ng/mL (ref <0.30)

## 2010-09-23 LAB — CBC
HCT: 35.4 % — ABNORMAL LOW (ref 36.0–46.0)
Hemoglobin: 11.7 g/dL — ABNORMAL LOW (ref 12.0–15.0)
MCH: 29.8 pg (ref 26.0–34.0)
MCHC: 33.1 g/dL (ref 30.0–36.0)
MCV: 90.3 fL (ref 78.0–100.0)

## 2010-09-23 NOTE — H&P (Signed)
[name], [name]                ACCOUNT NO.:  [id number]  MEDICAL RECORD NO.:  [id number]  LOCATION:  [facility]                         FACILITY:  [facility]  PHYSICIAN:  Lonia Blood, M.D.       DATE OF BIRTH:  20-Dec-1925  DATE OF ADMISSION:  09/23/2010 DATE OF DISCHARGE:                             HISTORY & PHYSICAL   PRIMARY CARE PHYSICIAN:  Maxwell Caul, MD  CHIEF COMPLAINT:  Chest pain and short of breath.  HISTORY OF PRESENT ILLNESS:  Ms. [name] is an 77 year old woman with frailty, mild dementia, hard-of-hearing, falls, residing in assisted living who was sent from the assisted-living last night after she complained of worsening shortness of breath and chest pain.  The patient has been having a cold for 3 days with increase nasal congestion, sore throat, and she has been coughing more and this morning complained of some worsening left-sided chest pain.  Of note, the chest pain has currently gone, also the patient has some chronic left-sided chest pain from old shingles.  PAST MEDICAL HISTORY: 1. Emphysema. 2. Hypertension. 3. Chronic back pain. 4. Cryptogenic cirrhosis. 5. Remote history of DVT in the right leg. 6. History of congenital cleft palate with impaired speech. 7. History of mastoid surgery. 8. Pneumothorax after a fall in April 2012 that actually was just     Radiology misreading as the patient never had a pneumothorax after     the fall.  Observation in the hospital for 3 days in April     confirmed the fact that she never had a pneumothorax.  HOME MEDICATIONS:  Ibuprofen, hydralazine, Tandem, Colace, Neurontin, aspirin, propranolol, prazosin, Pepcid.  SOCIAL HISTORY:  The patient lives in an assisted living.  She has a history of secondhand tobacco use, also had a wood burning stove.  Her healthcare power of attorney is Ms. Mayford Knife, granddaughter, cell phone number [phone].  Code status is full.  The patient does not drink any alcohol.  FAMILY  HISTORY:  Ten siblings with gynecological cancers, osteoporosis, hypertension.  REVIEW OF SYSTEMS:  As per HPI.  Positive also for chronic nausea and vomiting which comes cyclically to the patient.  Also positive for the patient is deaf in the left ear and very hard-of-hearing on the right ear, positive for cognitive deficits which are mild according to the family.  PHYSICAL EXAMINATION UPON TIME OF ADMISSION:  VITAL SIGNS:  Temperature 98.6, blood pressure 150/90.  Currently, there are some readings into the 220/100 when the patient initially came to the emergency room. Heart rate 60, respiratory rate 18, saturation 98% on 2 liters of oxygen. GENERAL:  The patient is alert.  She knows that she is at the hospital. She recognizes her family members.  She is calm and cooperative. HEENT:  Head is normocephalic, atraumatic.  Eyes:  Pupils equal, round, reactive to light and accommodation.  Extraocular movements intact. Throat shows enlarged right tonsil, erythema. NECK:  No JVD. CHEST:  Minimal wheezes, no rhonchi or crackles. HEART:  Regular without murmurs, rubs, or gallops. ABDOMEN:  Soft, nontender. EXTREMITIES:  Lower extremities with +1 edema. SKIN:  Warm, dry.  No suspicious looking rashes. NEUROLOGIC:  Cranial nerves II  through XII intact.  Strength 5/5 in all four extremities.  Sensation intact.  LABORATORY VALUES AT THE TIME OF ADMISSION:  White blood cell count is 10,000, hemoglobin 11.7, platelet count 197.  Troponin I less than 0.3, CK 40.  Sodium is 138, potassium 3.9, chloride 98, bicarbonate 32, BUN 11, creatinine 0.5, glucose 111.  ProBNP is 500.  Chest x-ray two views shows COPD.  ASSESSMENT AND PLAN:  This is an 77 year old woman with frailty, history of falls, chronic obstructive pulmonary disease, chronic left chest pain due to postherpetic neuralgia who was admitted now to the hospital with worsening dyspnea and chest pain in the setting of acute  sinusitis, acute tonsillitis with significant congestion of the upper airways.  I think this patient requires inpatient admission due to the potential for complicating factors such as developing pneumonia, severe chronic obstructive pulmonary disease exacerbation.  Also even though the nature of the chest pain is atypical, she has multiple risk factors for coronary artery disease including old age, severe hypertension.  It is good to mention that she has EKG changes with negative T-waves in I and aVL, so the patient will be placed on telemetry.  Cardiac enzymes will be checked.  We will also start her on antibiotics.  We will also use Singulair, Claritin, and Nasonex for decongestant purposes and I would though try to avoid pure decongestant medications due to the patient's concomitant severe hypertension disease.  Otherwise, the patient will be observed closely due to her risk of falls and further plans of care will be adjusted depending on how the progress is.     Lonia Blood, M.D.     SL/MEDQ  D:  09/23/2010  T:  09/23/2010  Job:  [id number]  cc:   Maxwell Caul, M.D.  Electronically Signed by Lonia Blood M.D. on 09/23/2010 05:36:08 PM

## 2010-09-24 LAB — CBC
HCT: 30.8 % — ABNORMAL LOW (ref 36.0–46.0)
MCHC: 32.8 g/dL (ref 30.0–36.0)
Platelets: 195 10*3/uL (ref 150–400)
RDW: 14.1 % (ref 11.5–15.5)
WBC: 7.4 10*3/uL (ref 4.0–10.5)

## 2010-09-24 LAB — BASIC METABOLIC PANEL
BUN: 14 mg/dL (ref 6–23)
GFR calc Af Amer: >60 mL/min (ref >60)
GFR calc non Af Amer: >60 mL/min (ref >60)
Potassium: 4.3 mEq/L (ref 3.5–5.1)

## 2010-10-06 NOTE — Discharge Summary (Signed)
  [name], [name]                ACCOUNT NO.:  [id number]  MEDICAL RECORD NO.:  [id number]  LOCATION:  4741                         FACILITY:  [facility]  PHYSICIAN:  Lonia Blood, M.D.       DATE OF BIRTH:  30-Jun-1925  DATE OF ADMISSION:  09/23/2010 DATE OF DISCHARGE:                        DISCHARGE SUMMARY - REFERRING   DISCHARGE DIAGNOSES: 1. Chronic obstructive pulmonary disease exacerbation. 2. Sinusitis with worst sinusitis being in the left sphenoid sinus. 3. Status post left mastoidectomy. 4. Congenital cleft palate. 5. Chest pain on admission with negative cardiac enzymes, not     suggestive of unstable angina. 6. Cryptogenic cirrhosis. 7. Chronic back pain. 8. Weakness and deconditioning.  DISCHARGE MEDICATIONS: 1. Prednisone taper. 2. Avelox 400 mg daily for 3 days. 3. Aspirin 81 mg daily. 4. Colace 100 mg twice a day. 5. Pepcid 20 mg twice a day. 6. Neurontin 700 mg daily. 7. Neurontin 600 mg daily. 8. Hydralazine 25 mg 4 times a day. 9. Prazosin 2 mg twice a day. 10.Propranolol sustained release 160 mg daily. 11.Tandem Plus 106 mg daily. 12.Albuterol nebulized 3 times a day.  CONDITION ON DISCHARGE:  [name] will be transferred to a skilled nursing home.  CONSULTATIONS:  No consultation was obtained.  HISTORY AND PHYSICAL:  Refer dictation H and P by Dr. Lavera Guise.  HOSPITAL COURSE:  Ms. [name] is a 77 year old woman with multiple medical problems who presents to the emergency room with complains of chest pain and shortness of breath.  She was started on intravenous antibiotics. The CT scan of the maxillofacial apparatus indicated scattered sinusitis.  She was treated with Claritin, Singulair, Flonase with some improvement in congestion.  She was started on intravenous Avelox and by hospital #2, she received couple of dose of intravenous steroids which also helped with dyspnea and congestion.  She is currently getting evaluated for possibility of transferred to  a skilled nursing home.     Lonia Blood, M.D.     SL/MEDQ  D:  09/25/2010  T:  09/25/2010  Job:  [id number]  cc:   Maxwell Caul, M.D.  Electronically Signed by Lonia Blood M.D. on 10/06/2010 04:46:10 PM

## 2011-01-07 ENCOUNTER — Emergency Department (HOSPITAL_COMMUNITY)
Admission: EM | Admit: 2011-01-07 | Discharge: 2011-01-07 | Disposition: A | Discharge status: Home / Self Care | Payer: Medicare Other | Attending: Emergency Medicine | Admitting: Emergency Medicine

## 2011-01-07 ENCOUNTER — Emergency Department (HOSPITAL_COMMUNITY): Payer: Medicare Other

## 2011-01-07 DIAGNOSIS — I10 Essential (primary) hypertension: Secondary | ICD-10-CM | POA: Insufficient documentation

## 2011-01-07 DIAGNOSIS — Z79899 Other long term (current) drug therapy: Secondary | ICD-10-CM | POA: Insufficient documentation

## 2011-01-07 DIAGNOSIS — Z86718 Personal history of other venous thrombosis and embolism: Secondary | ICD-10-CM | POA: Insufficient documentation

## 2011-01-07 DIAGNOSIS — R0602 Shortness of breath: Secondary | ICD-10-CM | POA: Insufficient documentation

## 2011-01-07 LAB — DIFFERENTIAL
Basophils Absolute: 0 10*3/uL (ref 0.0–0.1)
Eosinophils Absolute: 0.1 10*3/uL (ref 0.0–0.7)
Eosinophils Relative: 2 % (ref 0–5)
Lymphocytes Relative: 26 % (ref 12–46)
Neutrophils Relative %: 61 % (ref 43–77)

## 2011-01-07 LAB — URINALYSIS, ROUTINE W REFLEX MICROSCOPIC
Nitrite: NEGATIVE
Specific Gravity, Urine: 1.007 (ref 1.005–1.030)
Urobilinogen, UA: 0.2 mg/dL (ref 0.0–1.0)

## 2011-01-07 LAB — CBC
HCT: 38 % (ref 36.0–46.0)
Platelets: 187 10*3/uL (ref 150–400)
RBC: 4.33 MIL/uL (ref 3.87–5.11)
RDW: 13.7 % (ref 11.5–15.5)
WBC: 6 10*3/uL (ref 4.0–10.5)

## 2011-01-07 LAB — BASIC METABOLIC PANEL
GFR calc Af Amer: >90 mL/min (ref >90)
GFR calc non Af Amer: 83 mL/min — ABNORMAL LOW (ref >90)
Potassium: 3.5 mEq/L (ref 3.5–5.1)
Sodium: 136 mEq/L (ref 135–145)

## 2011-01-07 LAB — POCT I-STAT TROPONIN I: Troponin i, poc: 0 ng/mL (ref 0.00–0.08)

## 2011-01-08 ENCOUNTER — Emergency Department (HOSPITAL_COMMUNITY)
Admission: EM | Admit: 2011-01-08 | Discharge: 2011-01-08 | Disposition: A | Discharge status: Home / Self Care | Payer: Medicare Other | Attending: Emergency Medicine | Admitting: Emergency Medicine

## 2011-01-08 ENCOUNTER — Emergency Department (HOSPITAL_COMMUNITY): Payer: Medicare Other

## 2011-01-08 DIAGNOSIS — K219 Gastro-esophageal reflux disease without esophagitis: Secondary | ICD-10-CM | POA: Insufficient documentation

## 2011-01-08 DIAGNOSIS — I10 Essential (primary) hypertension: Secondary | ICD-10-CM | POA: Insufficient documentation

## 2011-01-08 DIAGNOSIS — K7460 Unspecified cirrhosis of liver: Secondary | ICD-10-CM | POA: Insufficient documentation

## 2011-01-08 LAB — DIFFERENTIAL
Basophils Absolute: 0 10*3/uL (ref 0.0–0.1)
Basophils Relative: 0 % (ref 0–1)
Monocytes Absolute: 0.7 10*3/uL (ref 0.1–1.0)
Neutro Abs: 4 10*3/uL (ref 1.7–7.7)
Neutrophils Relative %: 63 % (ref 43–77)

## 2011-01-08 LAB — CBC
Hemoglobin: 12.8 g/dL (ref 12.0–15.0)
MCHC: 34 g/dL (ref 30.0–36.0)

## 2011-01-08 LAB — POCT I-STAT, CHEM 8
Hemoglobin: 12.9 g/dL (ref 12.0–15.0)
Sodium: 140 mEq/L (ref 135–145)
TCO2: 26 mmol/L (ref 0–100)

## 2011-01-19 NOTE — Consult Note (Signed)
[name], [name]                ACCOUNT NO.:  [id number]  MEDICAL RECORD NO.:  [id number]  LOCATION:  [facility]                         FACILITY:  [facility]  PHYSICIAN:  Lonia Blood, M.D.      DATE OF BIRTH:  02/29/1926  DATE OF CONSULTATION:  01/08/2011 DATE OF DISCHARGE:                                CONSULTATION   PRIMARY CARE PHYSICIAN:  Medical laboratory scientific officer.  PRESENTING COMPLAINT:  High blood pressure.  HISTORY OF PRESENT ILLNESS:  The patient is an 77 year old female resident of nursing facility that was sent to the ED apparently twice a day, complained of high blood pressure.  The patient is not symptomatic, but her blood pressure has apparently been high.  The highest said to be around 200 systolic.  She is on multiple medications including hydralazine, terazosin.  Also on propranolol.  There is a PA in the nursing facility that apparently recommend that the patient should come to the ED.  The patient's blood pressure in the ED here is 186/72, after treatment with hydralazine is already down to 111/98.  Again, she is not symptomatic.  Denied any specific complaint.  She is only sent here because of "high blood pressure."  Her past medical history is significant for: 1. Liver cirrhosis. 2. History of cleft palate. 3. History of DVT. 4. GERD. 5. Hypertension. 6. Prior history of shingles. 7. Chronic back pain. 8. Constipation. 9. The patient has also decreased hearing. 10.The patient has also history of COPD.  ALLERGIES:  TYLENOL and PENICILLIN.  MEDICATIONS: 1. Aspirin 81 mg daily. 2. Docusate sodium 100 mg twice daily. 3. Gabapentin 100 mg daily. 4. Hydralazine 50 mg t.i.d. 5. Pepcid 20 mg b.i.d. 6. Terazosin 2 mg b.i.d. 7. Propranolol 160 mg daily.  SOCIAL HISTORY:  The patient lives in a nursing facility in [place].  No tobacco, alcohol, or IV drug use.  FAMILY HISTORY:  She has 10 siblings with mainly gynecologic cancer, osteoporosis and  hypertension.  REVIEW OF SYSTEMS:  All systems reviewed and are negative except per HPI.  PHYSICAL EXAMINATION:  VITAL SIGNS:  Temperature 98.4, initial blood pressure here 186/72, currently 111/98 with pulse 74, respiratory rate 17, sats 96% on room air. GENERAL:  The patient is awake, alert and oriented.  She is in no acute distress. HEENT:  PERRL.  EOMI.  No significant pallor.  No jaundice.  No rhinorrhea. NECK:  Supple.  No JVD.  No lymphadenopathy. RESPIRATORY:  She has good air entry bilaterally.  No wheezes.  No rales.  No crackles. CARDIOVASCULAR:  She has S1 and S2.  No murmur. ABDOMEN:  Soft, nontender with positive bowel sounds. EXTREMITIES:  No edema, cyanosis, or clubbing. SKIN:  No rashes.  No ulcers.  Her labs here, sodium 140, potassium 3.5, chloride 102, BUN 12, creatinine 0.80, glucose 104, ionized calcium 1.12.  White count is 6.4, hemoglobin 12.8 with platelet count of 197 with normal differentials.  ASSESSMENT:  This is an 77 year old female brought in from nursing facility secondary to high blood pressure.  More than likely, the patient had a flare of increasing her blood pressure but does not require hospitalization for control.  She needs adjustment  gradually of her medications.  At this point, she is taking hydralazine 50 mg t.i.d. with propranolol 160 mg daily.  Her heart rate will not take further treatment with Pepcid, however, with normal creatinine with normal labs. The patient still has room for other blood pressure medications to be added and adjusted.  I will recommend either adding an amlodipine 10 mg daily or we can add even an ACE inhibitor under close scrutiny.  It has not been reported that she did not do well with ACE inhibitor in the past, so I think it would be reasonable to try that again.  So, the patient has a lot of options in terms of blood pressure control in the nursing facility.  She does not, therefore, need to be hospitalized  for that since this is a chronic issue and this can easily be treated as an outpatient.  I will, therefore, recommend no hospitalization for this.     Lonia Blood, M.D.     Verlin Grills  D:  01/08/2011  T:  01/08/2011  Job:  [id number]  Electronically Signed by Lonia Blood M.D. on 01/19/2011 03:21:13 PM

## 2011-01-20 ENCOUNTER — Emergency Department (HOSPITAL_COMMUNITY)
Admission: EM | Admit: 2011-01-20 | Discharge: 2011-01-20 | Disposition: A | Discharge status: Home / Self Care | Payer: Medicare Other | Attending: Emergency Medicine | Admitting: Emergency Medicine

## 2011-01-20 ENCOUNTER — Emergency Department (HOSPITAL_COMMUNITY): Payer: Medicare Other

## 2011-01-20 DIAGNOSIS — R42 Dizziness and giddiness: Secondary | ICD-10-CM | POA: Insufficient documentation

## 2011-01-20 DIAGNOSIS — R51 Headache: Secondary | ICD-10-CM | POA: Insufficient documentation

## 2011-01-20 DIAGNOSIS — Z79899 Other long term (current) drug therapy: Secondary | ICD-10-CM | POA: Insufficient documentation

## 2011-01-20 DIAGNOSIS — K7460 Unspecified cirrhosis of liver: Secondary | ICD-10-CM | POA: Insufficient documentation

## 2011-01-20 DIAGNOSIS — Z86718 Personal history of other venous thrombosis and embolism: Secondary | ICD-10-CM | POA: Insufficient documentation

## 2011-01-20 DIAGNOSIS — K219 Gastro-esophageal reflux disease without esophagitis: Secondary | ICD-10-CM | POA: Insufficient documentation

## 2011-01-20 DIAGNOSIS — I10 Essential (primary) hypertension: Secondary | ICD-10-CM | POA: Insufficient documentation

## 2011-01-20 DIAGNOSIS — Z7982 Long term (current) use of aspirin: Secondary | ICD-10-CM | POA: Insufficient documentation

## 2011-01-20 LAB — COMPREHENSIVE METABOLIC PANEL
ALT: 11 U/L (ref 0–35)
CO2: 26 mEq/L (ref 19–32)
Calcium: 9.2 mg/dL (ref 8.4–10.5)
Creatinine, Ser: 0.62 mg/dL (ref 0.50–1.10)
GFR calc Af Amer: >90 mL/min (ref >90)
GFR calc non Af Amer: 82 mL/min — ABNORMAL LOW (ref >90)
Glucose, Bld: 108 mg/dL — ABNORMAL HIGH (ref 70–99)
Sodium: 136 mEq/L (ref 135–145)
Total Protein: 6.6 g/dL (ref 6.0–8.3)

## 2011-01-20 LAB — DIFFERENTIAL
Basophils Absolute: 0 10*3/uL (ref 0.0–0.1)
Basophils Relative: 0 % (ref 0–1)
Monocytes Relative: 10 % (ref 3–12)
Neutro Abs: 4.2 10*3/uL (ref 1.7–7.7)
Neutrophils Relative %: 65 % (ref 43–77)

## 2011-01-20 LAB — URINALYSIS, ROUTINE W REFLEX MICROSCOPIC
Glucose, UA: NEGATIVE mg/dL
Specific Gravity, Urine: 1.005 (ref 1.005–1.030)
pH: 7.5 (ref 5.0–8.0)

## 2011-01-20 LAB — URINE MICROSCOPIC-ADD ON

## 2011-01-20 LAB — CBC
Hemoglobin: 12.9 g/dL (ref 12.0–15.0)
MCH: 29.9 pg (ref 26.0–34.0)
RBC: 4.31 MIL/uL (ref 3.87–5.11)
WBC: 6.4 10*3/uL (ref 4.0–10.5)

## 2011-04-21 ENCOUNTER — Encounter (HOSPITAL_COMMUNITY): Payer: Self-pay | Admitting: Neurology

## 2011-04-21 ENCOUNTER — Emergency Department (HOSPITAL_COMMUNITY): Payer: Medicare Other

## 2011-04-21 ENCOUNTER — Inpatient Hospital Stay (HOSPITAL_COMMUNITY): Payer: Medicare Other

## 2011-04-21 ENCOUNTER — Inpatient Hospital Stay (HOSPITAL_COMMUNITY)
Admission: EM | Admit: 2011-04-21 | Discharge: 2011-04-24 | DRG: 078 | Disposition: A | Discharge status: Skilled Nursing Facility | Payer: Medicare Other | Attending: Internal Medicine | Admitting: Internal Medicine

## 2011-04-21 ENCOUNTER — Other Ambulatory Visit: Payer: Self-pay

## 2011-04-21 DIAGNOSIS — K219 Gastro-esophageal reflux disease without esophagitis: Secondary | ICD-10-CM | POA: Diagnosis present

## 2011-04-21 DIAGNOSIS — I671 Cerebral aneurysm, nonruptured: Secondary | ICD-10-CM | POA: Diagnosis present

## 2011-04-21 DIAGNOSIS — Z7982 Long term (current) use of aspirin: Secondary | ICD-10-CM

## 2011-04-21 DIAGNOSIS — Z79899 Other long term (current) drug therapy: Secondary | ICD-10-CM

## 2011-04-21 DIAGNOSIS — Z66 Do not resuscitate: Secondary | ICD-10-CM | POA: Diagnosis present

## 2011-04-21 DIAGNOSIS — K7460 Unspecified cirrhosis of liver: Secondary | ICD-10-CM | POA: Diagnosis present

## 2011-04-21 DIAGNOSIS — J4489 Other specified chronic obstructive pulmonary disease: Secondary | ICD-10-CM | POA: Diagnosis present

## 2011-04-21 DIAGNOSIS — K5900 Constipation, unspecified: Secondary | ICD-10-CM | POA: Diagnosis present

## 2011-04-21 DIAGNOSIS — G8929 Other chronic pain: Secondary | ICD-10-CM | POA: Diagnosis present

## 2011-04-21 DIAGNOSIS — R4182 Altered mental status, unspecified: Secondary | ICD-10-CM

## 2011-04-21 DIAGNOSIS — N179 Acute kidney failure, unspecified: Secondary | ICD-10-CM | POA: Diagnosis present

## 2011-04-21 DIAGNOSIS — M549 Dorsalgia, unspecified: Secondary | ICD-10-CM | POA: Diagnosis present

## 2011-04-21 DIAGNOSIS — I10 Essential (primary) hypertension: Secondary | ICD-10-CM | POA: Diagnosis present

## 2011-04-21 DIAGNOSIS — I674 Hypertensive encephalopathy: Principal | ICD-10-CM | POA: Diagnosis present

## 2011-04-21 DIAGNOSIS — J449 Chronic obstructive pulmonary disease, unspecified: Secondary | ICD-10-CM | POA: Diagnosis present

## 2011-04-21 DIAGNOSIS — Z86718 Personal history of other venous thrombosis and embolism: Secondary | ICD-10-CM

## 2011-04-21 DIAGNOSIS — R0602 Shortness of breath: Secondary | ICD-10-CM

## 2011-04-21 HISTORY — DX: Unspecified cirrhosis of liver: K74.60

## 2011-04-21 HISTORY — DX: Muscle weakness (generalized): M62.81

## 2011-04-21 LAB — CARDIAC PANEL(CRET KIN+CKTOT+MB+TROPI)
CK, MB: 1.6 ng/mL (ref 0.3–4.0)
Relative Index: INVALID (ref 0.0–2.5)
Total CK: 42 U/L (ref 7–177)
Troponin I: <0.3 ng/mL (ref <0.30)

## 2011-04-21 LAB — POCT I-STAT 3, ART BLOOD GAS (G3+)
pCO2 arterial: 26.7 mmHg — ABNORMAL LOW (ref 35.0–45.0)
pH, Arterial: 7.605 (ref 7.350–7.400)
pO2, Arterial: 131 mmHg — ABNORMAL HIGH (ref 80.0–100.0)

## 2011-04-21 LAB — DIFFERENTIAL
Basophils Absolute: 0 10*3/uL (ref 0.0–0.1)
Basophils Relative: 0 % (ref 0–1)
Eosinophils Absolute: 0.1 10*3/uL (ref 0.0–0.7)
Eosinophils Relative: 1 % (ref 0–5)
Lymphocytes Relative: 14 % (ref 12–46)
Lymphs Abs: 1.2 10*3/uL (ref 0.7–4.0)
Monocytes Absolute: 0.6 10*3/uL (ref 0.1–1.0)
Monocytes Relative: 7 % (ref 3–12)
Neutro Abs: 6.8 10*3/uL (ref 1.7–7.7)
Neutrophils Relative %: 78 % — ABNORMAL HIGH (ref 43–77)

## 2011-04-21 LAB — CBC
HCT: 38.7 % (ref 36.0–46.0)
Hemoglobin: 13.6 g/dL (ref 12.0–15.0)
MCH: 30.8 pg (ref 26.0–34.0)
MCHC: 35.1 g/dL (ref 30.0–36.0)
MCHC: 34.4 g/dL (ref 30.0–36.0)
MCV: 87.6 fL (ref 78.0–100.0)
Platelets: 199 10*3/uL (ref 150–400)
RBC: 4.42 MIL/uL (ref 3.87–5.11)
RDW: 13.5 % (ref 11.5–15.5)
RDW: 13.6 % (ref 11.5–15.5)
WBC: 8.8 10*3/uL (ref 4.0–10.5)

## 2011-04-21 LAB — URINALYSIS, ROUTINE W REFLEX MICROSCOPIC
Bilirubin Urine: NEGATIVE
Glucose, UA: NEGATIVE mg/dL
Hgb urine dipstick: NEGATIVE
Ketones, ur: NEGATIVE mg/dL
Leukocytes, UA: NEGATIVE
Nitrite: NEGATIVE
Protein, ur: NEGATIVE mg/dL
Specific Gravity, Urine: 1.008 (ref 1.005–1.030)
Urobilinogen, UA: 0.2 mg/dL (ref 0.0–1.0)
pH: 8 (ref 5.0–8.0)

## 2011-04-21 LAB — COMPREHENSIVE METABOLIC PANEL
ALT: 4535 U/L — ABNORMAL HIGH (ref 0–35)
AST: 2663 U/L — ABNORMAL HIGH (ref 0–37)
Albumin: 2.5 g/dL — ABNORMAL LOW (ref 3.5–5.2)
Alkaline Phosphatase: 118 U/L — ABNORMAL HIGH (ref 39–117)
BUN: 38 mg/dL — ABNORMAL HIGH (ref 6–23)
CO2: 24 mEq/L (ref 19–32)
Calcium: 7.8 mg/dL — ABNORMAL LOW (ref 8.4–10.5)
Chloride: 102 mEq/L (ref 96–112)
Creatinine, Ser: 1.28 mg/dL — ABNORMAL HIGH (ref 0.50–1.10)
GFR calc Af Amer: 44 mL/min — ABNORMAL LOW (ref >90)
GFR calc non Af Amer: 38 mL/min — ABNORMAL LOW (ref >90)
Glucose, Bld: 105 mg/dL — ABNORMAL HIGH (ref 70–99)
Potassium: 4.1 mEq/L (ref 3.5–5.1)
Sodium: 136 mEq/L (ref 135–145)
Total Bilirubin: 2.4 mg/dL — ABNORMAL HIGH (ref 0.3–1.2)
Total Protein: 6.1 g/dL (ref 6.0–8.3)

## 2011-04-21 LAB — INFLUENZA PANEL BY PCR (TYPE A & B)
H1N1 flu by pcr: NOT DETECTED
Influenza A By PCR: NEGATIVE
Influenza B By PCR: NEGATIVE

## 2011-04-21 LAB — CULTURE, BLOOD (ROUTINE X 2)
Culture  Setup Time: 201301131732
Culture  Setup Time: 201301131733
Culture: NO GROWTH
Culture: NO GROWTH

## 2011-04-21 LAB — HEMOGLOBIN A1C
Hgb A1c MFr Bld: 5.5 % (ref <5.7)
Mean Plasma Glucose: 111 mg/dL (ref <117)

## 2011-04-21 LAB — PROTIME-INR: Prothrombin Time: 13.6 seconds (ref 11.6–15.2)

## 2011-04-21 LAB — RAPID URINE DRUG SCREEN, HOSP PERFORMED
Amphetamines: NOT DETECTED
Opiates: NOT DETECTED

## 2011-04-21 LAB — PROCALCITONIN: Procalcitonin: <0.1 ng/mL

## 2011-04-21 LAB — AMMONIA: Ammonia: 14 umol/L (ref 11–60)

## 2011-04-21 LAB — GLUCOSE, CAPILLARY: Glucose-Capillary: 124 mg/dL — ABNORMAL HIGH (ref 70–99)

## 2011-04-21 LAB — LACTIC ACID, PLASMA: Lactic Acid, Venous: 1.3 mmol/L (ref 0.5–2.2)

## 2011-04-21 LAB — TSH: TSH: 0.851 u[IU]/mL (ref 0.350–4.500)

## 2011-04-21 MED ORDER — POLYETHYLENE GLYCOL 3350 17 G PO PACK
17.0000 g | PACK | Freq: Every day | ORAL | Status: DC | PRN
  Administered 2011-04-23: 17 g via ORAL
  Filled 2011-04-21 (×2): qty 1

## 2011-04-21 MED ORDER — ASPIRIN 300 MG RE SUPP
300.0000 mg | Freq: Every day | RECTAL | Status: DC
  Filled 2011-04-21: qty 1

## 2011-04-21 MED ORDER — ACETAMINOPHEN 325 MG PO TABS
650.0000 mg | ORAL_TABLET | Freq: Four times a day (QID) | ORAL | Status: DC | PRN
  Administered 2011-04-23: 650 mg via ORAL
  Filled 2011-04-21: qty 2

## 2011-04-21 MED ORDER — ENOXAPARIN SODIUM 40 MG/0.4ML ~~LOC~~ SOLN
40.0000 mg | SUBCUTANEOUS | Status: DC
  Administered 2011-04-21 – 2011-04-24 (×4): 40 mg via SUBCUTANEOUS
  Filled 2011-04-21 (×6): qty 0.4

## 2011-04-21 MED ORDER — HYDRALAZINE HCL 20 MG/ML IJ SOLN
10.0000 mg | INTRAMUSCULAR | Status: DC | PRN
  Administered 2011-04-21: 14:00:00 via INTRAVENOUS
  Administered 2011-04-21: 10 mg via INTRAVENOUS
  Filled 2011-04-21 (×2): qty 0.5

## 2011-04-21 MED ORDER — ONDANSETRON HCL 4 MG/2ML IJ SOLN
4.0000 mg | Freq: Four times a day (QID) | INTRAMUSCULAR | Status: DC | PRN
  Administered 2011-04-21 – 2011-04-22 (×2): 4 mg via INTRAVENOUS
  Filled 2011-04-21 (×2): qty 2

## 2011-04-21 MED ORDER — ALBUTEROL SULFATE (5 MG/ML) 0.5% IN NEBU
5.0000 mg | INHALATION_SOLUTION | Freq: Once | RESPIRATORY_TRACT | Status: AC
  Administered 2011-04-21: 5 mg via RESPIRATORY_TRACT
  Filled 2011-04-21: qty 1

## 2011-04-21 MED ORDER — POTASSIUM CHLORIDE IN NACL 20-0.9 MEQ/L-% IV SOLN
INTRAVENOUS | Status: DC
  Filled 2011-04-21 (×3): qty 1000

## 2011-04-21 MED ORDER — CLONIDINE HCL 0.1 MG/24HR TD PTWK
0.1000 mg | MEDICATED_PATCH | TRANSDERMAL | Status: DC
  Administered 2011-04-21: 0.1 mg via TRANSDERMAL
  Filled 2011-04-21 (×2): qty 1

## 2011-04-21 MED ORDER — ACETAMINOPHEN 650 MG RE SUPP: 650.0000 mg | Freq: Four times a day (QID) | RECTAL | Status: DC | PRN

## 2011-04-21 MED ORDER — LEVALBUTEROL HCL 0.63 MG/3ML IN NEBU
0.6300 mg | INHALATION_SOLUTION | Freq: Four times a day (QID) | RESPIRATORY_TRACT | Status: DC
  Administered 2011-04-21 – 2011-04-23 (×6): 0.63 mg via RESPIRATORY_TRACT
  Filled 2011-04-21 (×12): qty 3

## 2011-04-21 MED ORDER — ASPIRIN 300 MG RE SUPP
300.0000 mg | Freq: Every day | RECTAL | Status: DC
  Administered 2011-04-21 – 2011-04-24 (×4): 300 mg via RECTAL
  Filled 2011-04-21 (×5): qty 1

## 2011-04-21 MED ORDER — ONDANSETRON HCL 4 MG PO TABS: 4.0000 mg | ORAL_TABLET | Freq: Four times a day (QID) | ORAL | Status: DC | PRN

## 2011-04-21 NOTE — ED Notes (Signed)
Pt noted to be becoming increasingly lethargic. PA notified. Pt lethargic, difficult to arouse. Pt unresponsive to voice, responsive to sternal rub. During episode of lethargy pt maintained vital signs. Family at bedside. EDP to bedside.

## 2011-04-21 NOTE — ED Provider Notes (Signed)
Medical screening examination/treatment/procedure(s) were conducted as a shared visit with non-physician practitioner(s) and myself.  I personally evaluated the patient during the encounter  AMS on arrival. [place]axig/waning. C/O shortness of breath, no CP. Able to fully cooperate with exam although only intermittently verbalizing.  Labs and w/u unremarkable except metabolic alkalosis. Pt is DNR/DNI. Admitted for further w/u and evaluation. Consider CTA chest although would be atypical presentation for PE  Forbes Cellar, MD 04/21/11 1332

## 2011-04-21 NOTE — ED Provider Notes (Addendum)
History     CSN: [id number]  Arrival date & time 04/21/11  4098   First MD Initiated Contact with Patient 04/21/11 0915      Chief Complaint  Patient presents with  . Shortness of Breath  . Altered Mental Status    (Consider location/radiation/quality/duration/timing/severity/associated sxs/prior treatment) HPI Patient comes from a nursing home where it EMS was called up to the patient was unresponsive.  Arrival in the patient responsive and complaining of cough and shortness of breath.  Patient is unable to give me much information other than that she short of breath and having cough. Level 5 Caveat applies. Past Medical History  Diagnosis Date  . Hypertension   . Chronic back pain   . Obstructive chronic bronchitis   . Muscle weakness   . Cirrhosis     History reviewed. No pertinent past surgical history.  No family history on file.  History  Substance Use Topics  . Smoking status: Never Smoker   . Smokeless tobacco: Not on file  . Alcohol Use: No    OB History    Grav Para Term Preterm Abortions TAB SAB Ect Mult Living                  Review of Systems  Unable to perform ROS: Mental status change    Allergies  Acetaminophen and Penicillins  Home Medications  No current outpatient prescriptions on file.  BP 195/88  Pulse 78  Temp(Src) 98.7 F (37.1 C) (Rectal)  Resp 16  SpO2 100%  Physical Exam  Constitutional: She appears well-developed and well-nourished.  HENT:  Head: Normocephalic and atraumatic.  Eyes: Pupils are equal, round, and reactive to light.  Neck: Normal range of motion. Neck supple.  Cardiovascular: Normal rate and regular rhythm.   Pulmonary/Chest: She is in respiratory distress. She has no wheezes. She has no rales.       She has rhonchi noted in the base of the lungs.  Abdominal: Soft. Bowel sounds are normal. She exhibits no distension. There is no tenderness. There is no rebound and no guarding.  Neurological: She is alert.        Patient is alert at times and has episodes where she is not alert or responding.  Normal strength in all her extremities and sensation  Skin: Skin is warm and dry. No rash noted.    ED Course  Procedures (including critical care time)  Labs Reviewed  DIFFERENTIAL - Abnormal; Notable for the following:    Neutrophils Relative 78 (*)    All other components within normal limits  GLUCOSE, CAPILLARY - Abnormal; Notable for the following:    Glucose-Capillary 124 (*)    All other components within normal limits  POCT I-STAT 3, BLOOD GAS (G3+) - Abnormal; Notable for the following:    pH, Arterial 7.605 (*)    pCO2 arterial 26.7 (*)    pO2, Arterial 131.0 (*)    Bicarbonate 26.5 (*)    Acid-Base Excess 6.0 (*)    All other components within normal limits  COMPREHENSIVE METABOLIC PANEL - Abnormal; Notable for the following:    Glucose, Bld 105 (*)    BUN 38 (*)    Creatinine, Ser 1.28 (*)    Calcium 7.8 (*)    Albumin 2.5 (*)    Alkaline Phosphatase 118 (*)    Total Bilirubin 2.4 (*)    GFR calc non Af Amer 38 (*)    GFR calc Af Amer 44 (*)  All other components within normal limits  CBC  CARDIAC PANEL(CRET KIN+CKTOT+MB+TROPI)  URINALYSIS, ROUTINE W REFLEX MICROSCOPIC  LACTIC ACID, PLASMA  PROCALCITONIN  CULTURE, BLOOD (ROUTINE X 2)  CULTURE, BLOOD (ROUTINE X 2)  INFLUENZA PANEL BY PCR  I-STAT 3, BLOOD GAS (G3+)  AMMONIA  AMMONIA   Ct Head Wo Contrast  04/21/2011  *RADIOLOGY REPORT*  Clinical Data: 78 year old female with difficulty speaking, altered mental status, weakness.  CT HEAD WITHOUT CONTRAST  Technique:  Contiguous axial images were obtained from the base of the skull through the vertex without contrast.  Comparison: 01/20/2011 and earlier.  Findings: Chronic left sphenoid sinus disease.  Sequelae of left mastoidectomy.  Other Visualized paranasal sinuses and mastoids are clear.  Visualized orbit soft tissues are within normal limits. Right forehead scalp  lipoma. No acute osseous abnormality identified.  Calcified atherosclerosis at the skull base.  No ventriculomegaly. No midline shift, mass effect, or evidence of mass lesion.  No evidence of cortically based acute infarction identified.  No acute intracranial hemorrhage identified.  Mild for age cerebral white matter hypodensity.  IMPRESSION: 1.  Stable and normal for age noncontrast CT appearance of the brain. 2.  Chronic left sphenoid sinus disease.  Original Report Authenticated By: Harley Hallmark, M.D.   Dg Chest Port 1 View  04/21/2011  *RADIOLOGY REPORT*  Clinical Data: Short of breath.  Cough.  Weakness. COPD/bronchitis.  Cirrhosis.  Hypertension.  PORTABLE CHEST - 1 VIEW  Comparison: 01/20/2011  Findings: Underlying hyperinflation. Normal heart size.  Aortic atherosclerosis.  Left costophrenic angle excluded.  Eventration of the right hemidiaphragm. No pleural effusion or pneumothorax. Borderline prominence of the right hilum is similar back to last February and favored to be within normal variation.  Clear lungs.  Numerous leads and wires project over the chest.  IMPRESSION: COPD/chronic bronchitis. No acute superimposed process.  Original Report Authenticated By: Consuello Bossier, M.D.     1. Altered mental status   2. SOB (shortness of breath)    He should be admitted to the hospital by the [facility].  Patient's family is advised to do test results and the plan.  All questions were answered.   MDM   MDM Reviewed: previous chart, nursing note and vitals Interpretation: labs, ECG, x-ray and CT scan Total time providing critical care: 30-74 minutes. This excludes time spent performing separately reportable procedures and services. Consults: admitting MD  CRITICAL CARE Performed by: Carlyle Dolly   Total critical care time: 31  Critical care time was exclusive of separately billable procedures and treating other patients.  Critical care was necessary to treat or  prevent imminent or life-threatening deterioration.  Critical care was time spent personally by me on the following activities: development of treatment plan with patient and/or surrogate as well as nursing, discussions with consultants, evaluation of patient's response to treatment, examination of patient, obtaining history from patient or surrogate, ordering and performing treatments and interventions, ordering and review of laboratory studies, ordering and review of radiographic studies, pulse oximetry and re-evaluation of patient's condition.    Date: 04/21/2011  Rate: 81  Rhythm: normal sinus rhythm  QRS Axis: normal  Intervals: normal  ST/T Wave abnormalities: nonspecific T wave changes  Conduction Disutrbances:none  Narrative Interpretation:   Old EKG Reviewed: unchanged           Carlyle Dolly, PA-C 04/21/11 1258  Carlyle Dolly, PA-C 04/21/11 1449

## 2011-04-21 NOTE — H&P (Addendum)
DATE OF ADMISSION : 04/21/11   PRIMARY CARE PHYSICIAN: [employer].   PRESENTING COMPLAINT: High blood pressure. , AMS  HISTORY OF PRESENT ILLNESS: The patient is an 78 year old female  resident of nursing facility that was sent to the ED apparently due to unresponsiveness.Upon Arrival  the patient is more  responsive and complaining of cough and shortness of breath. Patient is unable to give me much information , is a poor historian , other than that she short of breath and having cough. She is also hypertesive with sbp > 200. The patient is not symptomatic,  but her blood pressure has apparently been high. The highest said to be  around 200 systolic. She is on multiple medications including  hydralazine, terazosin. Also on propranolol. At [facility] . Denies any CP, SOB,  She is in and out of consicousness.No hypercarbia on ABG, CT head is negative.   Her past medical history is significant for:  1. Liver cirrhosis.  2. History of cleft palate.  3. History of DVT.  4. GERD.  5. Hypertension.  6. Prior history of shingles.  7. Chronic back pain.  8. Constipation.  9. The patient has also decreased hearing.  10.The patient has also history of COPD.   ALLERGIES: TYLENOL and PENICILLIN.   MEDICATIONS:  1. Aspirin 81 mg daily.  2. Docusate sodium 100 mg twice daily.  3. Gabapentin 100 mg daily.  4. Hydralazine 50 mg t.i.d.  5. Pepcid 20 mg b.i.d.  6. Terazosin 2 mg b.i.d.  7. Propranolol 160 mg daily.   SOCIAL HISTORY: The patient lives in a nursing facility in [place]. No  tobacco, alcohol, or IV drug use.   FAMILY HISTORY: She has 10 siblings with mainly gynecologic cancer,  osteoporosis and hypertension.   REVIEW OF SYSTEMS: All systems reviewed and are negative except per  HPI.  PHYSICAL EXAMINATION: VITAL SIGNS: BP 195/88  Pulse 78  Temp(Src) 98.7 F (37.1 C) (Rectal)  Resp 16  SpO2 100%  Physical Exam  Constitutional: She appears well-developed and  well-nourished.  HENT:  Head: Normocephalic and atraumatic.  Eyes: Pupils are equal, round, and reactive to light.  Neck: Normal range of motion. Neck supple.  Cardiovascular: Normal rate and regular rhythm.  Pulmonary/Chest: She is in respiratory distress. She has no wheezes. She has no rales.  She has rhonchi noted in the base of the lungs.  Abdominal: Soft. Bowel sounds are normal. She exhibits no distension. There is no tenderness. There is no rebound and no guarding.  Neurological: She is alert.  Patient is alert at times and has episodes where she is not alert or responding. Normal strength in all her extremities and sensation  Skin: Skin is warm and dry. No rash noted.   ED Course   Procedures (including critical care time)  Labs Reviewed   DIFFERENTIAL - Abnormal; Notable for the following:    Neutrophils Relative  78 (*)     All other components within normal limits   GLUCOSE, CAPILLARY - Abnormal; Notable for the following:    Glucose-Capillary  124 (*)     All other components within normal limits   POCT I-STAT 3, BLOOD GAS (G3+) - Abnormal; Notable for the following:    pH, Arterial  7.605 (*)     pCO2 arterial  26.7 (*)     pO2, Arterial  131.0 (*)     Bicarbonate  26.5 (*)     Acid-Base Excess  6.0 (*)  All other components within normal limits   COMPREHENSIVE METABOLIC PANEL - Abnormal; Notable for the following:    Glucose, Bld  105 (*)     BUN  38 (*)     Creatinine, Ser  1.28 (*)     Calcium  7.8 (*)     Albumin  2.5 (*)     Alkaline Phosphatase  118 (*)     Total Bilirubin  2.4 (*)     GFR calc non Af Amer  38 (*)     GFR calc Af Amer  44 (*)     All other components within normal limits   CBC   CARDIAC PANEL(CRET KIN+CKTOT+MB+TROPI)   URINALYSIS, ROUTINE W REFLEX MICROSCOPIC   LACTIC ACID, PLASMA   PROCALCITONIN   CULTURE, BLOOD (ROUTINE X 2)   CULTURE, BLOOD (ROUTINE X 2)   INFLUENZA PANEL BY PCR   I-STAT 3, BLOOD GAS (G3+)   AMMONIA   AMMONIA     Ct Head Wo Contrast  04/21/2011 *RADIOLOGY REPORT* Clinical Data: 78 year old female with difficulty speaking, altered mental status, weakness. CT HEAD WITHOUT CONTRAST Technique: Contiguous axial images were obtained from the base of the skull through the vertex without contrast. Comparison: 01/20/2011 and earlier. Findings: Chronic left sphenoid sinus disease. Sequelae of left mastoidectomy. Other Visualized paranasal sinuses and mastoids are clear. Visualized orbit soft tissues are within normal limits. Right forehead scalp lipoma. No acute osseous abnormality identified. Calcified atherosclerosis at the skull base. No ventriculomegaly. No midline shift, mass effect, or evidence of mass lesion. No evidence of cortically based acute infarction identified. No acute intracranial hemorrhage identified. Mild for age cerebral white matter hypodensity. IMPRESSION: 1. Stable and normal for age noncontrast CT appearance of the brain. 2. Chronic left sphenoid sinus disease. Original Report Authenticated By: Harley Hallmark, M.D.  Dg Chest Port 1 View  04/21/2011 *RADIOLOGY REPORT* Clinical Data: Short of breath. Cough. Weakness. COPD/bronchitis. Cirrhosis. Hypertension. PORTABLE CHEST - 1 VIEW Comparison: 01/20/2011 Findings: Underlying hyperinflation. Normal heart size. Aortic atherosclerosis. Left costophrenic angle excluded. Eventration of the right hemidiaphragm. No pleural effusion or pneumothorax. Borderline prominence of the right hilum is similar back to last February and favored to be within normal variation. Clear lungs. Numerous leads and wires project over the chest. IMPRESSION: COPD/chronic bronchitis. No acute superimposed process. Original Report Authenticated By: Consuello Bossier, M.D  EXTREMITIES: No edema, cyanosis, or clubbing.  SKIN: No rashes. No ulcers.  Assessment and Plan 1. AMS , she has hx of cirrhosis ,check ammonia level, stroke work up, EEG , PR asa , MRI/MRA 2. SOB ,check d dimer ,  if positive will r/o PE 3. Cirrhosis cal explain her abnormal LFT's ,continue to monitor 4. Hypetensive urgency, use prn hydalazine , may consider clonidine patch as pt is NPO for now 5. aneurysm, the patient had a cerebral aneurysm described as being 8 mm in diameter. We need to discuss with the family the family would want further intervention for this. DNR/DNI

## 2011-04-21 NOTE — ED Notes (Signed)
Report called to floor, pt in MRI. Will transport when she returns

## 2011-04-21 NOTE — ED Notes (Signed)
PER EMS- Pt comes from [facility]. The EMS call came out as unresponsive patient. When EMS arrived on scene pt was found to be responsive. Nursing staff reporting ALOC, pt c/o SOB, coughing through the night. Initial BP 213/110, HR 110, 90% RA. Pt given 1 spray of nitro and placed on 3 L oxygen. Bp was lowered to 202/102 and then 141/87. CBG 128. Pt was alert and oriented for EMS. C/o generalized body aches. Reporting CP when coughing.

## 2011-04-21 NOTE — ED Notes (Signed)
Pt resting. Alert and oriented. Talking with family members. Responding appropriately. Pt hypertensive . Admitting ordering medication, I've sent to pharmacy to sent the meds

## 2011-04-21 NOTE — ED Notes (Signed)
ED providers aware of pt BP

## 2011-04-21 NOTE — ED Notes (Addendum)
Pt appearing lethargic, responsive to stimuli, voice at this time. Pt appearing confused. Pt hypertensive, EDP and PA aware. Pt skin warm, moist. Pt has productive cough.

## 2011-04-21 NOTE — ED Notes (Signed)
Patient transported to floor by Dorathy Daft, RN

## 2011-04-22 ENCOUNTER — Inpatient Hospital Stay (HOSPITAL_COMMUNITY): Payer: Medicare Other

## 2011-04-22 ENCOUNTER — Other Ambulatory Visit: Payer: Self-pay

## 2011-04-22 LAB — CARDIAC PANEL(CRET KIN+CKTOT+MB+TROPI)
CK, MB: 2.6 ng/mL (ref 0.3–4.0)
CK, MB: 2.4 ng/mL (ref 0.3–4.0)
Relative Index: INVALID (ref 0.0–2.5)
Troponin I: <0.3 ng/mL (ref <0.30)

## 2011-04-22 LAB — COMPREHENSIVE METABOLIC PANEL
AST: 22 U/L (ref 0–37)
BUN: 17 mg/dL (ref 6–23)
CO2: 25 mEq/L (ref 19–32)
Chloride: 99 mEq/L (ref 96–112)
Creatinine, Ser: 0.71 mg/dL (ref 0.50–1.10)
GFR calc non Af Amer: 78 mL/min — ABNORMAL LOW (ref >90)
Glucose, Bld: 114 mg/dL — ABNORMAL HIGH (ref 70–99)
Total Bilirubin: 1 mg/dL (ref 0.3–1.2)

## 2011-04-22 LAB — CBC
MCH: 29.3 pg (ref 26.0–34.0)
Platelets: 236 10*3/uL (ref 150–400)
RBC: 4.5 MIL/uL (ref 3.87–5.11)
WBC: 10.2 10*3/uL (ref 4.0–10.5)

## 2011-04-22 MED ORDER — HYDRALAZINE HCL 50 MG PO TABS
100.0000 mg | ORAL_TABLET | Freq: Three times a day (TID) | ORAL | Status: DC
  Administered 2011-04-22 – 2011-04-24 (×7): 100 mg via ORAL
  Filled 2011-04-22 (×9): qty 2

## 2011-04-22 MED ORDER — PRAZOSIN HCL 2 MG PO CAPS
2.0000 mg | ORAL_CAPSULE | Freq: Every day | ORAL | Status: DC
  Administered 2011-04-22 – 2011-04-23 (×2): 2 mg via ORAL
  Filled 2011-04-22 (×3): qty 1

## 2011-04-22 MED ORDER — PROPRANOLOL HCL ER 160 MG PO CP24
160.0000 mg | ORAL_CAPSULE | Freq: Every day | ORAL | Status: DC
  Administered 2011-04-22 – 2011-04-24 (×2): 160 mg via ORAL
  Filled 2011-04-22 (×3): qty 1

## 2011-04-22 MED ORDER — AMLODIPINE BESYLATE 5 MG PO TABS
5.0000 mg | ORAL_TABLET | Freq: Every day | ORAL | Status: DC
  Administered 2011-04-22 – 2011-04-24 (×2): 5 mg via ORAL
  Filled 2011-04-22 (×3): qty 1

## 2011-04-22 NOTE — Progress Notes (Signed)
PROGRESS NOTE  [name]:[id number] DOB: 21-Jan-1926 DOA: 04/21/2011 PCP: Terald Sleeper, MD, MD  Brief narrative: 78 year old woman from [facility] nursing home presented via EMS with history of altered mental status/unresponsiveness, cough and shortness of breath. Found to be markedly hypertensive. His mental status waxed and waned in the emergency department. She was admitted for altered mental status, stroke workup, EEG, hypertensive emergency.  She was seen in the emergency Department 01/08/2011 for hypertension. Seen 07/31/2010 for labile hypertension. Discharged 06/01/2010 with history of malignant hypertension.  Past medical history: DVT, GERD, hypertension, chronic back pain, COPD, cerebral aneurysm, degenerative cleft palate, cryptogenic cirrhosis  Consultants:  Neurology  Speech therapy: Regular diet, thin liquids. Medications whole with liquids. No further followup required.  Procedures:  EEG pending  Antibiotics:  None  Interim History: Interval documentation reviewed. Remains markedly hypertensive, mildly hypoxic. Subjective: Denies complaints at this time. No focal weakness. No pain. Hungry. Wants to eat.  Objective: Filed Vitals:   04/21/11 2021 04/21/11 2052 04/22/11 0216 04/22/11 0554  BP:  206/104  184/99  Pulse:  99  97  Temp:  98.1 F (36.7 C)  97.3 F (36.3 C)  TempSrc:  Oral  Oral  Resp:  20  18  Height:      Weight:      SpO2: 98% 96% 97% 98%    Intake/Output Summary (Last 24 hours) at 04/22/11 0751 Last data filed at 04/21/11 1526  Gross per 24 hour  Intake      0 ml  Output    780 ml  Net   -780 ml    Exam:  General: Appears calm and comfortable. Cardiovascular: Regular rate and rhythm. No murmur, rub or gallop. No lower extremity edema. Telemetry: Sinus rhythm/sinus tachycardia. No arrhythmias. Respiratory: Clear to auscultation bilaterally. No wheezes, rales or rhonchi. Normal respiratory effort. Musculoskeletal:  Tone and strength in the upper and lower extremities is grossly normal and symmetric. Neurologic: Cranial nerves 2-12 appear to be intact. Speech is fluent and clear. She has a history of cleft palate which minimally obscures her speech.  Data Reviewed: Basic Metabolic Panel:  Lab 04/21/11 1478 04/21/11 1009  NA 138 136  K 3.5 4.1  CL 99 102  CO2 25 24  GLUCOSE 114* 105*  BUN 17 38*  CREATININE 0.71 1.28*  CALCIUM 8.9 7.8*  MG -- --  PHOS -- --   Liver Function Tests:  Lab 04/21/11 2352 04/21/11 1009  AST 22 2663*  ALT 10 4535*  ALKPHOS 78 118*  BILITOT 1.0 2.4*  PROT 7.0 6.1  ALBUMIN 3.8 2.5*    Lab 04/21/11 1218  AMMONIA 14   CBC:  Lab 04/21/11 2352 04/21/11 1600 04/21/11 0938  WBC 10.2 10.5 8.8  NEUTROABS -- -- 6.8  HGB 13.2 13.8 13.6  HCT 39.5 40.1 38.7  MCV 87.8 87.6 87.6  PLT 236 208 199   Cardiac Enzymes:  Lab 04/21/11 2352 04/21/11 1600 04/21/11 0938  CKTOTAL 68 44 42  CKMB 2.6 2.0 1.6  CKMBINDEX -- -- --  TROPONINI <0.30 <0.30 <0.30   CBG:  Lab 04/21/11 1031  GLUCAP 124*    Recent Results (from the past 240 hour(s))  MRSA PCR SCREENING     Status: Normal   Collection Time   04/21/11  4:00 PM      Component Value Range Status Comment   MRSA by PCR NEGATIVE  NEGATIVE  Final      Studies: Ct Head Wo Contrast  04/21/2011  *  RADIOLOGY REPORT*  Clinical Data: 78 year old female with difficulty speaking, altered mental status, weakness.  CT HEAD WITHOUT CONTRAST  Technique:  Contiguous axial images were obtained from the base of the skull through the vertex without contrast.  Comparison: 01/20/2011 and earlier.  Findings: Chronic left sphenoid sinus disease.  Sequelae of left mastoidectomy.  Other Visualized paranasal sinuses and mastoids are clear.  Visualized orbit soft tissues are within normal limits. Right forehead scalp lipoma. No acute osseous abnormality identified.  Calcified atherosclerosis at the skull base.  No ventriculomegaly. No midline  shift, mass effect, or evidence of mass lesion.  No evidence of cortically based acute infarction identified.  No acute intracranial hemorrhage identified.  Mild for age cerebral white matter hypodensity.  IMPRESSION: 1.  Stable and normal for age noncontrast CT appearance of the brain. 2.  Chronic left sphenoid sinus disease.  Original Report Authenticated By: Harley Hallmark, M.D.   Mr [facility] Head Wo Contrast  04/21/2011  **ADDENDUM** CREATED: 04/21/2011 16:25:49  Study discussed by telephone with Dr. Susie Cassette.  **END ADDENDUM** SIGNED BY: Harley Hallmark, M.D.    04/21/2011  *RADIOLOGY REPORT*  Clinical Data:  78 year old female with altered mental status, was unresponsive upon arrival.  Weakness.  Comparison: Head CTs without contrast 04/21/2011 and earlier.  MRI HEAD WITHOUT CONTRAST  Technique: Multiplanar, multiecho pulse sequences of the brain and surrounding structures were obtained according to standard protocol without intravenous contrast.  Findings: No restricted diffusion to suggest acute infarction.  No midline shift, mass effect, evidence of mass lesion, ventriculomegaly, extra-axial collection or acute intracranial hemorrhage.  Cervicomedullary junction and pituitary are within normal limits.  Major intracranial vascular flow voids are preserved.  There is an abnormal flow void at the left MCA bifurcation, see MRA findings below.  Generalized T2 hyperintensity in the thalami and basal ganglia. Diffusion is facilitated in these areas.  There is no CT correlation of this finding earlier today.  T1 signal in the deep gray matter nuclei is preserved.  Scattered mild for age cerebral white matter T2 and FLAIR hyperintensity most resembles chronic small vessel disease.  The brainstem and cerebellum are within normal limits.  Negative visualized cervical spine.  Chronic left sphenoid sinus inflammatory changes.  Other Visualized paranasal sinuses and mastoids are clear.  Visualized orbit soft tissues are  within normal limits.  Small right forehead scalp lipoma. Visualized bone marrow signal is within normal limits.  IMPRESSION: 1.  No acute infarct.  Symmetric T2 signal abnormality in the deep gray matter nuclei is nonspecific and of unclear acuity.  Has there been a recent toxic exposure or is there an acute metabolic abnormality which might explain this appearance? 2.  Left MCA aneurysm suspected, see MRA findings below.  MRA HEAD WITHOUT CONTRAST  Technique: Angiographic images of the Circle of Willis were obtained using MRA technique without  intravenous contrast.  Findings: Study is moderately degraded by motion artifact despite repeated imaging attempts.  Antegrade flow in the posterior circulation.  Vertebral artery detail is limited.  The left vertebral artery appears dominant.  No basilar stenosis.  Superior cerebellar artery and left PCA origins are within normal limits.  There is a fetal type right PCA. Bilateral PCA branches are grossly normal.  Antegrade flow in both ICA siphons which are dolichoectatic, measuring up to 8 mm in diameter bilaterally.  The ophthalmic and right posterior communicating artery origins are normal.  The left posterior communicating artery is diminutive or absent. Carotid termini are patent.  MCA and ACA origins are within normal limits. Anterior communicating artery is within normal limits.  There is an azygos type ACA appearance.  Visualized right MCA branches are within normal limits.  The left MCA M1 segment is within normal limits.  Arising at the left MCA by for patient and directed anteriorly is a saccular aneurysm with a broad base of involvement  at the origin of the dominant left posterior sylvian division.  The aneurysm measures 7 x 8 x 5 mm.  Otherwise, the visualized left MCA M2 segments are within normal limits.  IMPRESSION: 1.  Aneurysm at the left MCA bifurcation measuring 7 x 8 x 5 mm. 2.  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 3.  Intermittently motion degraded despite repeated imaging attempts.  No major intracranial branch occlusion or stenosis identified. Original Report Authenticated By: Harley Hallmark, M.D.   Dg Chest Port 1 View  04/21/2011  *RADIOLOGY REPORT*  Clinical Data: Short of breath.  Cough.  Weakness. COPD/bronchitis.  Cirrhosis.  Hypertension.  PORTABLE CHEST - 1 VIEW  Comparison: 01/20/2011  Findings: Underlying hyperinflation. Normal heart size.  Aortic atherosclerosis.  Left costophrenic angle excluded.  Eventration of the right hemidiaphragm. No pleural effusion or pneumothorax. Borderline prominence of the right hilum is similar back to last February and favored to be within normal variation.  Clear lungs.  Numerous leads and wires project over the chest.  IMPRESSION: COPD/chronic bronchitis. No acute superimposed process.  Original Report Authenticated By: Consuello Bossier, M.D.    Scheduled Meds:   . albuterol  5 mg Nebulization Once  . aspirin  300 mg Rectal Daily  . cloNIDine  0.1 mg Transdermal Weekly  . enoxaparin  40 mg Subcutaneous Q24H  . levalbuterol  0.63 mg Nebulization Q6H  . DISCONTD: aspirin  300 mg Rectal Daily   Continuous Infusions:   . 0.9 % NaCl with KCl 20 mEq / L       Assessment/Plan: 1. Acute encephalopathy: Etiology unclear. Currently appears resolved. MRI of the brain abnormal. MRA demonstrates aneurysms. Neurology consultation. EEG recommended. 2. Acute renal failure: Resolved. 3. Uncontrolled hypertension: Resume amlodipine. Resume hydralazine 100 mg by mouth 3 times a day. Resume lisinopril 20 mg by mouth daily tomorrow. Resume Prazosin 2 mg by mouth each bedtime. Resume propranolol 160 mg by mouth daily. 4. Cirrhosis?: Marked transaminitis has completely resolved today. The significance of the initial testing is unclear.  Code Status: DO NOT RESUSCITATE Family Communication: Albertine Patricia: [phone] Disposition Plan: Pending further  evaluation.   Brendia Sacks, MD  [facility] Pager [phone] 04/22/2011, 7:51 AM    LOS: 1 day

## 2011-04-22 NOTE — Progress Notes (Signed)
Speech Language/Pathology Clinical/Bedside Swallow Evaluation Patient Details  Name: KEYANA GUEVARA MRN: [id number] DOB: [birth year] Today's Date: 04/22/2011  Past Medical History:  Past Medical History  Diagnosis Date  . Hypertension   . Chronic back pain   . Obstructive chronic bronchitis   . Muscle weakness   . Cirrhosis    Past Surgical History: History reviewed. No pertinent past surgical history. HPI:  Pt is an 78 year old female admitted from SNF with unresponsiveness. CXR, clear, CT clear, MRI shows left MCA aneurysm, abnormality in gray matter nuclei. Pt with cleft palate only, unrepaired, with hypernasal speech with nasal emission. Pt reports this has never impacted her swallow function.    Assessment/Recommendations/Treatment Plan    SLP Assessment Clinical Impression Statement: Pt presents iwth normal swallow function with no s/s of aspiration. Pt is able to initiate a regular/thin liquid diet. No SLP f/u needed.  Risk for Aspiration: None  Swallow Recommendations Solid Consistency: Regular Liquid Consistency: Thin Liquid Administration via: Straw;Cup Medication Administration: Whole meds with liquid Supervision: Patient able to self feed Follow up Recommendations: None  Treatment Plan Treatment Plan Recommendations: No treatment recommended at this time     Individuals Consulted Consulted and Agree with Results and Recommendations: Patient  Harlon Ditty, MA CCC-SLP [phone] Claudine Mouton 04/22/2011,8:55 AM

## 2011-04-22 NOTE — Progress Notes (Signed)
*  PRELIMINARY RESULTS* EEG has been performed.  Markus Jarvis 04/22/2011, 4:30 PM

## 2011-04-22 NOTE — Progress Notes (Signed)
Occupational Therapy Evaluation Patient Details Name: [name] MRN: [id number] DOB: [birth year] Today's Date: 04/22/2011  Problem List:  Patient Active Problem List  Diagnoses  . Altered mental status  . COPD (chronic obstructive pulmonary disease)  . Hypertension, accelerated  . Cirrhosis    Past Medical History:  Past Medical History  Diagnosis Date  . Hypertension   . Chronic back pain   . Obstructive chronic bronchitis   . Muscle weakness   . Cirrhosis    Past Surgical History: History reviewed. No pertinent past surgical history.  OT Assessment/Plan/Recommendation OT Assessment Clinical Impression Statement: Pt presents to OT with decreased I with ADL activity compared to baseline function at SNF. Pt will benefit from OT to increase I with ADL activity OT Recommendation/Assessment: Patient will need skilled OT in the acute care venue OT Problem List: Decreased strength;Decreased activity tolerance OT Therapy Diagnosis : Generalized weakness OT Plan OT Frequency: Min 1X/week OT Treatment/Interventions: Self-care/ADL training;DME and/or AE instruction;Patient/family education OT Recommendation Follow Up Recommendations: Skilled nursing facility Equipment Recommended: Defer to next venue OT Goals Acute Rehab OT Goals OT Goal Formulation: With patient Time For Goal Achievement: 2 weeks ADL Goals Pt Will Perform Grooming: with modified independence ADL Goal: Grooming - Progress: Progressing toward goals Pt Will Transfer to Toilet: with modified independence ADL Goal: Toilet Transfer - Progress: Progressing toward goals  OT Evaluation    Prior Functioning Home Living Lives With: Other (Comment) Receives Help From: Other (Comment) Type of Home: Skilled Nursing Facility Home Layout: One level   ADL ADL Eating/Feeding: Performed Where Assessed - Eating/Feeding: Chair Grooming: Simulated;Minimal assistance Where Assessed - Grooming: Unsupported;Sitting,  chair Upper Body Bathing: Simulated;Minimal assistance Where Assessed - Upper Body Bathing: Unsupported;Sitting, chair Lower Body Bathing: Simulated;Moderate assistance Where Assessed - Lower Body Bathing: Sit to stand from chair Upper Body Dressing: Performed;Minimal assistance Where Assessed - Upper Body Dressing: Unsupported;Sitting, chair Lower Body Dressing: Moderate assistance;Simulated Where Assessed - Lower Body Dressing: Sit to stand from chair Toilet Transfer: Moderate assistance;Performed Toilet Transfer Details (indicate cue type and reason): sit to stand from chair Toileting - Clothing Manipulation: Simulated;Moderate assistance Toileting - Clothing Manipulation Details (indicate cue type and reason): walker Where Assessed - Toileting Clothing Manipulation: Standing Toileting - Hygiene: Moderate assistance Toileting - Hygiene Details (indicate cue type and reason): walker Where Assessed - Toileting Hygiene: Standing Ambulation Related to ADLs: pt fatigued quickly with OT Vision/Perception  Vision - History Baseline Vision: Wears glasses all the time    Extremity Assessment RUE Assessment RUE Assessment: Within Functional Limits LUE Assessment LUE Assessment: Within Functional Limits         [name], [place]ung 04/22/2011, 1:41 PM

## 2011-04-22 NOTE — Consult Note (Signed)
[facility] CONSULT NOTE     Reason for Consult: AMS CC: AMS   HPI:    [name] is an 78 y.o. female who initially was admitted to the hospital for HTN and AMS. Per family members patient has episodes of labile BP in which she will suddenly become hypertensive.  They believe she is taking her medications as directed.  During these episodes she often will become more confused very much like what happened.  Upon arrival to ED patient was non responsive. Initial CT head was negative and follow up MRI showed no acute stroke, however it did show symmetric T2 signal abnormality in the deep gray matter nuclei is nonspecific and of unclear acuity. Neurology was asked to consult. On consultation, patient has returned to her baseline, is alert, oriented and able to point out both her daughters in the room.  She follows all commands and shows no focality.     Past Medical History  Diagnosis Date  . Hypertension   . Chronic back pain   . Obstructive chronic bronchitis   . Muscle weakness   . Cirrhosis     History reviewed. No pertinent past surgical history.  No family history on file.  Social History:  reports that she has never smoked. She does not have any smokeless tobacco history on file. She reports that she does not drink alcohol or use illicit drugs.  Allergies  Allergen Reactions  . Acetaminophen Other (See Comments)    No reaction stated on patient MAR  . Penicillins Other (See Comments)    No reaction stated on patient MAR    Medications:    Scheduled:   . amLODipine  5 mg Oral Daily  . aspirin  300 mg Rectal Daily  . enoxaparin  40 mg Subcutaneous Q24H  . hydrALAZINE  100 mg Oral Q8H  . levalbuterol  0.63 mg Nebulization Q6H  . prazosin  2 mg Oral QHS  . propranolol  160 mg Oral Daily  . DISCONTD: aspirin  300 mg Rectal Daily  . DISCONTD: cloNIDine  0.1 mg Transdermal Weekly   Continuous:   . DISCONTD: 0.9 % NaCl with KCl 20  mEq / L      Review of Systems - General ROS: negative for - chills, fatigue, fever or hot flashes Hematological and Lymphatic ROS: negative for - bruising, fatigue, jaundice or pallor Endocrine ROS: negative for - hair pattern changes, hot flashes, mood swings or skin changes Respiratory ROS: negative for - cough, hemoptysis, orthopnea or wheezing Cardiovascular ROS: negative for - dyspnea on exertion, orthopnea, palpitations or shortness of breath Gastrointestinal ROS: negative for - abdominal pain, appetite loss, blood in stools, diarrhea or hematemesis Musculoskeletal ROS: negative for - joint pain, joint stiffness, joint swelling or muscle pain Neurological ROS: See HPI Dermatological ROS: negative for dry skin, pruritus and rash   Blood pressure 174/93, pulse 98, temperature 97.8 F (36.6 C), temperature source Oral, resp. rate 18, height 5\' 4"  (1.626 m), weight 57.7 kg (127 lb 3.3 oz), SpO2 96.00%.   Neurologic Examination:  Mental Status: Alert, oriented, thought content appropriate.  Very hard of hearing. Speech fluent without evidence of aphasia. Able to follow 3 step commands without difficulty. Cranial Nerves: II-Visual fields grossly intact. III/IV/VI-Extraocular movements intact.  Pupils reactive bilaterally. V/VII-Smile symmetric VIII-grossly intact XI-bilateral shoulder shrug XII-midline tongue extension Motor: 4/5 bilaterally with  normal tone and bulk Sensory: Pinprick and light touch intact throughout, bilaterally Deep Tendon Reflexes: 2+ in the upper extremities minimal at the patella bilaterally and no reflex at the achilles.  They are symmetric throughout Plantars: upgoing bilaterally Cerebellar: Normal finger-to-nose, normal Heel to shin  Lab Results  Component Value Date/Time   CHOL  Value: 147        ATP III CLASSIFICATION:  <200     mg/dL   Desirable  161-096  mg/dL   Borderline High  >=045    mg/dL   High        07/15/8117  3:55 AM    Results for orders  placed during the hospital encounter of 04/21/11 (from the past 48 hour(s))  CBC     Status: Normal   Collection Time   04/21/11  9:38 AM      Component Value Range Comment   WBC 8.8  4.0 - 10.5 (K/uL)    RBC 4.42  3.87 - 5.11 (MIL/uL)    Hemoglobin 13.6  12.0 - 15.0 (g/dL)    HCT 14.7  82.9 - 56.2 (%)    MCV 87.6  78.0 - 100.0 (fL)    MCH 30.8  26.0 - 34.0 (pg)    MCHC 35.1  30.0 - 36.0 (g/dL)    RDW 13.0  86.5 - 78.4 (%)    Platelets 199  150 - 400 (K/uL)   DIFFERENTIAL     Status: Abnormal   Collection Time   04/21/11  9:38 AM      Component Value Range Comment   Neutrophils Relative 78 (*) 43 - 77 (%)    Neutro Abs 6.8  1.7 - 7.7 (K/uL)    Lymphocytes Relative 14  12 - 46 (%)    Lymphs Abs 1.2  0.7 - 4.0 (K/uL)    Monocytes Relative 7  3 - 12 (%)    Monocytes Absolute 0.6  0.1 - 1.0 (K/uL)    Eosinophils Relative 1  0 - 5 (%)    Eosinophils Absolute 0.1  0.0 - 0.7 (K/uL)    Basophils Relative 0  0 - 1 (%)    Basophils Absolute 0.0  0.0 - 0.1 (K/uL)   CARDIAC PANEL(CRET KIN+CKTOT+MB+TROPI)     Status: Normal   Collection Time   04/21/11  9:38 AM      Component Value Range Comment   Total CK 42  7 - 177 (U/L)    CK, MB 1.6  0.3 - 4.0 (ng/mL)    Troponin I <0.30  <0.30 (ng/mL)    Relative Index RELATIVE INDEX IS INVALID  0.0 - 2.5    LACTIC ACID, PLASMA     Status: Normal   Collection Time   04/21/11  9:53 AM      Component Value Range Comment   Lactic Acid, Venous 1.3  0.5 - 2.2 (mmol/L)   URINALYSIS, ROUTINE W REFLEX MICROSCOPIC     Status: Normal   Collection Time   04/21/11  9:56 AM      Component Value Range Comment   Color, Urine YELLOW  YELLOW     APPearance CLEAR  CLEAR     Specific Gravity, Urine 1.008  1.005 - 1.030     pH 8.0  5.0 - 8.0     Glucose, UA NEGATIVE  NEGATIVE (mg/dL)    Hgb urine dipstick NEGATIVE  NEGATIVE     Bilirubin Urine NEGATIVE  NEGATIVE     Ketones, ur NEGATIVE  NEGATIVE (  mg/dL)    Protein, ur NEGATIVE  NEGATIVE (mg/dL)     Urobilinogen, UA 0.2  0.0 - 1.0 (mg/dL)    Nitrite NEGATIVE  NEGATIVE     Leukocytes, UA NEGATIVE  NEGATIVE  MICROSCOPIC NOT DONE ON URINES WITH NEGATIVE PROTEIN, BLOOD, LEUKOCYTES, NITRITE, OR GLUCOSE <1000 mg/dL.  CULTURE, BLOOD (ROUTINE X 2)     Status: Normal (Preliminary result)   Collection Time   04/21/11 10:05 AM      Component Value Range Comment   Specimen Description BLOOD LEFT HAND      Special Requests BOTTLES DRAWN AEROBIC ONLY 1CC      Setup Time [id number]      Culture        Value:        BLOOD CULTURE RECEIVED NO GROWTH TO DATE CULTURE WILL BE HELD FOR 5 DAYS BEFORE ISSUING A FINAL NEGATIVE REPORT   Report Status PENDING     CULTURE, BLOOD (ROUTINE X 2)     Status: Normal (Preliminary result)   Collection Time   04/21/11 10:05 AM      Component Value Range Comment   Specimen Description BLOOD RIGHT ARM      Special Requests BOTTLES DRAWN AEROBIC ONLY 3CC      Setup Time [id number]      Culture        Value:        BLOOD CULTURE RECEIVED NO GROWTH TO DATE CULTURE WILL BE HELD FOR 5 DAYS BEFORE ISSUING A FINAL NEGATIVE REPORT   Report Status PENDING     PROCALCITONIN     Status: Normal   Collection Time   04/21/11 10:09 AM      Component Value Range Comment   Procalcitonin <0.10     COMPREHENSIVE METABOLIC PANEL     Status: Abnormal   Collection Time   04/21/11 10:09 AM      Component Value Range Comment   Sodium 136  135 - 145 (mEq/L)    Potassium 4.1  3.5 - 5.1 (mEq/L)    Chloride 102  96 - 112 (mEq/L)    CO2 24  19 - 32 (mEq/L)    Glucose, Bld 105 (*) 70 - 99 (mg/dL)    BUN 38 (*) 6 - 23 (mg/dL)    Creatinine, Ser 9.52 (*) 0.50 - 1.10 (mg/dL)    Calcium 7.8 (*) 8.4 - 10.5 (mg/dL)    Total Protein 6.1  6.0 - 8.3 (g/dL)    Albumin 2.5 (*) 3.5 - 5.2 (g/dL)    AST 8413 (*) 0 - 37 (U/L)    ALT 4535 (*) 0 - 35 (U/L)    Alkaline Phosphatase 118 (*) 39 - 117 (U/L)    Total Bilirubin 2.4 (*) 0.3 - 1.2 (mg/dL)    GFR calc non Af Amer 38 (*) >90 (mL/min)    GFR  calc Af Amer 44 (*) >90 (mL/min)   GLUCOSE, CAPILLARY     Status: Abnormal   Collection Time   04/21/11 10:31 AM      Component Value Range Comment   Glucose-Capillary 124 (*) 70 - 99 (mg/dL)    Comment 1 Documented in Chart      Comment 2 Notify RN     POCT I-STAT 3, BLOOD GAS (G3+)     Status: Abnormal   Collection Time   04/21/11 10:34 AM      Component Value Range Comment   pH, Arterial 7.605 (*) 7.350 - 7.400  pCO2 arterial 26.7 (*) 35.0 - 45.0 (mmHg)    pO2, Arterial 131.0 (*) 80.0 - 100.0 (mmHg)    Bicarbonate 26.5 (*) 20.0 - 24.0 (mEq/L)    TCO2 27  0 - 100 (mmol/L)    O2 Saturation 99.0      Acid-Base Excess 6.0 (*) 0.0 - 2.0 (mmol/L)    Collection site RADIAL, ALLEN'S TEST ACCEPTABLE      Drawn by Operator      Sample type ARTERIAL     INFLUENZA PANEL BY PCR     Status: Normal   Collection Time   04/21/11 11:41 AM      Component Value Range Comment   Influenza A By PCR NEGATIVE  NEGATIVE     Influenza B By PCR NEGATIVE  NEGATIVE     H1N1 flu by pcr NOT DETECTED  NOT DETECTED    AMMONIA     Status: Normal   Collection Time   04/21/11 12:18 PM      Component Value Range Comment   Ammonia 14  11 - 60 (umol/L)   CBC     Status: Normal   Collection Time   04/21/11  4:00 PM      Component Value Range Comment   WBC 10.5  4.0 - 10.5 (K/uL)    RBC 4.58  3.87 - 5.11 (MIL/uL)    Hemoglobin 13.8  12.0 - 15.0 (g/dL)    HCT 16.1  09.6 - 04.5 (%)    MCV 87.6  78.0 - 100.0 (fL)    MCH 30.1  26.0 - 34.0 (pg)    MCHC 34.4  30.0 - 36.0 (g/dL)    RDW 40.9  81.1 - 91.4 (%)    Platelets 208  150 - 400 (K/uL)   TSH     Status: Normal   Collection Time   04/21/11  4:00 PM      Component Value Range Comment   TSH 0.851  0.350 - 4.500 (uIU/mL)   PROTIME-INR     Status: Normal   Collection Time   04/21/11  4:00 PM      Component Value Range Comment   Prothrombin Time 13.6  11.6 - 15.2 (seconds)    INR 1.02  0.00 - 1.49    CARDIAC PANEL(CRET KIN+CKTOT+MB+TROPI)     Status: Normal     Collection Time   04/21/11  4:00 PM      Component Value Range Comment   Total CK 44  7 - 177 (U/L)    CK, MB 2.0  0.3 - 4.0 (ng/mL)    Troponin I <0.30  <0.30 (ng/mL)    Relative Index RELATIVE INDEX IS INVALID  0.0 - 2.5    HEMOGLOBIN A1C     Status: Normal   Collection Time   04/21/11  4:00 PM      Component Value Range Comment   Hemoglobin A1C 5.5  <5.7 (%)    Mean Plasma Glucose 111  <117 (mg/dL)   PRO B NATRIURETIC PEPTIDE     Status: Abnormal   Collection Time   04/21/11  4:00 PM      Component Value Range Comment   Pro B Natriuretic peptide (BNP) 635.1 (*) 0 - 450 (pg/mL)   MRSA PCR SCREENING     Status: Normal   Collection Time   04/21/11  4:00 PM      Component Value Range Comment   MRSA by PCR NEGATIVE  NEGATIVE    SALICYLATE LEVEL  Status: Abnormal   Collection Time   04/21/11  6:20 PM      Component Value Range Comment   Salicylate Lvl <2.0 (*) 2.8 - 20.0 (mg/dL)   ACETAMINOPHEN LEVEL     Status: Normal   Collection Time   04/21/11  6:20 PM      Component Value Range Comment   Acetaminophen (Tylenol), Serum <15.0  10 - 30 (ug/mL)   URINE RAPID DRUG SCREEN (HOSP PERFORMED)     Status: Normal   Collection Time   04/21/11  6:50 PM      Component Value Range Comment   Opiates NONE DETECTED  NONE DETECTED     Cocaine NONE DETECTED  NONE DETECTED     Benzodiazepines NONE DETECTED  NONE DETECTED     Amphetamines NONE DETECTED  NONE DETECTED     Tetrahydrocannabinol NONE DETECTED  NONE DETECTED     Barbiturates NONE DETECTED  NONE DETECTED    CARDIAC PANEL(CRET KIN+CKTOT+MB+TROPI)     Status: Normal   Collection Time   04/21/11 11:52 PM      Component Value Range Comment   Total CK 68  7 - 177 (U/L)    CK, MB 2.6  0.3 - 4.0 (ng/mL)    Troponin I <0.30  <0.30 (ng/mL)    Relative Index RELATIVE INDEX IS INVALID  0.0 - 2.5    COMPREHENSIVE METABOLIC PANEL     Status: Abnormal   Collection Time   04/21/11 11:52 PM      Component Value Range Comment   Sodium 138   135 - 145 (mEq/L)    Potassium 3.5  3.5 - 5.1 (mEq/L)    Chloride 99  96 - 112 (mEq/L)    CO2 25  19 - 32 (mEq/L)    Glucose, Bld 114 (*) 70 - 99 (mg/dL)    BUN 17  6 - 23 (mg/dL) DELTA CHECK NOTED   Creatinine, Ser 0.71  0.50 - 1.10 (mg/dL) DELTA CHECK NOTED   Calcium 8.9  8.4 - 10.5 (mg/dL)    Total Protein 7.0  6.0 - 8.3 (g/dL)    Albumin 3.8  3.5 - 5.2 (g/dL)    AST 22  0 - 37 (U/L)    ALT 10  0 - 35 (U/L)    Alkaline Phosphatase 78  39 - 117 (U/L)    Total Bilirubin 1.0  0.3 - 1.2 (mg/dL)    GFR calc non Af Amer 78 (*) >90 (mL/min)    GFR calc Af Amer 90 (*) >90 (mL/min)   CBC     Status: Normal   Collection Time   04/21/11 11:52 PM      Component Value Range Comment   WBC 10.2  4.0 - 10.5 (K/uL)    RBC 4.50  3.87 - 5.11 (MIL/uL)    Hemoglobin 13.2  12.0 - 15.0 (g/dL)    HCT 62.1  30.8 - 65.7 (%)    MCV 87.8  78.0 - 100.0 (fL)    MCH 29.3  26.0 - 34.0 (pg)    MCHC 33.4  30.0 - 36.0 (g/dL)    RDW 84.6  96.2 - 95.2 (%)    Platelets 236  150 - 400 (K/uL)   CARDIAC PANEL(CRET KIN+CKTOT+MB+TROPI)     Status: Normal   Collection Time   04/22/11  9:27 AM      Component Value Range Comment   Total CK 72  7 - 177 (U/L)    CK, MB 2.4  0.3 - 4.0 (ng/mL)    Troponin I <0.30  <0.30 (ng/mL)    Relative Index RELATIVE INDEX IS INVALID  0.0 - 2.5      Ct Head Wo Contrast  04/21/2011  *RADIOLOGY REPORT*  Clinical Data: 78 year old female with difficulty speaking, altered mental status, weakness.  CT HEAD WITHOUT CONTRAST  Technique:  Contiguous axial images were obtained from the base of the skull through the vertex without contrast.  Comparison: 01/20/2011 and earlier.  Findings: Chronic left sphenoid sinus disease.  Sequelae of left mastoidectomy.  Other Visualized paranasal sinuses and mastoids are clear.  Visualized orbit soft tissues are within normal limits. Right forehead scalp lipoma. No acute osseous abnormality identified.  Calcified atherosclerosis at the skull base.  No  ventriculomegaly. No midline shift, mass effect, or evidence of mass lesion.  No evidence of cortically based acute infarction identified.  No acute intracranial hemorrhage identified.  Mild for age cerebral white matter hypodensity.  IMPRESSION: 1.  Stable and normal for age noncontrast CT appearance of the brain. 2.  Chronic left sphenoid sinus disease.  Original Report Authenticated By: Harley Hallmark, M.D.   Mr [facility] Head Wo Contrast  04/21/2011  **ADDENDUM** CREATED: 04/21/2011 16:25:49  Study discussed by telephone with Dr. Susie Cassette.  **END ADDENDUM** SIGNED BY: Harley Hallmark, M.D.      Mr Brain Wo Contrast  04/21/2011  **ADDENDUM** CREATED: 04/21/2011 16:25:49  Study discussed by telephone with Dr. Susie Cassette.  **END ADDENDUM** SIGNED BY: Harley Hallmark, M.D.    04/21/2011  *RADIOLOGY REPORT*  Clinical Data:  78 year old female with altered mental status, was unresponsive upon arrival.  Weakness.  Comparison: Head CTs without contrast 04/21/2011 and earlier.  MRI HEAD WITHOUT CONTRAST  Technique: Multiplanar, multiecho pulse sequences of the brain and surrounding structures were obtained according to standard protocol without intravenous contrast.  Findings: No restricted diffusion to suggest acute infarction.  No midline shift, mass effect, evidence of mass lesion, ventriculomegaly, extra-axial collection or acute intracranial hemorrhage.  Cervicomedullary junction and pituitary are within normal limits.  Major intracranial vascular flow voids are preserved.  There is an abnormal flow void at the left MCA bifurcation, see MRA findings below.  Generalized T2 hyperintensity in the thalami and basal ganglia. Diffusion is facilitated in these areas.  There is no CT correlation of this finding earlier today.  T1 signal in the deep gray matter nuclei is preserved.  Scattered mild for age cerebral white matter T2 and FLAIR hyperintensity most resembles chronic small vessel disease.  The brainstem and cerebellum are  within normal limits.  Negative visualized cervical spine.  Chronic left sphenoid sinus inflammatory changes.  Other Visualized paranasal sinuses and mastoids are clear.  Visualized orbit soft tissues are within normal limits.  Small right forehead scalp lipoma. Visualized bone marrow signal is within normal limits.    IMPRESSION: 1.  No acute infarct.  Symmetric T2 signal abnormality in the deep gray matter nuclei is nonspecific and of unclear acuity.  Has there been a recent toxic exposure or is there an acute metabolic abnormality which might explain this appearance? 2.  Left MCA aneurysm suspected, see MRA findings below.    MRA HEAD WITHOUT CONTRAST  Technique: Angiographic images of the Circle of Willis were obtained using MRA technique without  intravenous contrast.  Findings: Study is moderately degraded by motion artifact despite repeated imaging attempts.  Antegrade flow in the posterior circulation.  Vertebral artery detail is limited.  The left vertebral artery  appears dominant.  No basilar stenosis.  Superior cerebellar artery and left PCA origins are within normal limits.  There is a fetal type right PCA. Bilateral PCA branches are grossly normal.  Antegrade flow in both ICA siphons which are dolichoectatic, measuring up to 8 mm in diameter bilaterally.  The ophthalmic and right posterior communicating artery origins are normal.  The left posterior communicating artery is diminutive or absent. Carotid termini are patent.  MCA and ACA origins are within normal limits. Anterior communicating artery is within normal limits.  There is an azygos type ACA appearance.  Visualized right MCA branches are within normal limits.  The left MCA M1 segment is within normal limits.  Arising at the left MCA by for patient and directed anteriorly is a saccular aneurysm with a broad base of involvement  at the origin of the dominant left posterior sylvian division.  The aneurysm measures 7 x 8 x 5 mm.  Otherwise, the  visualized left MCA M2 segments are within normal limits.    IMPRESSION: 1.  Aneurysm at the left MCA bifurcation measuring 7 x 8 x 5 mm. 2.  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 3.  Intermittently motion degraded despite repeated imaging attempts.  No major intracranial branch occlusion or stenosis identified. Original Report Authenticated By: Harley Hallmark, M.D.   Dg Chest Port 1 View  04/21/2011  *RADIOLOGY REPORT*  Clinical Data: Short of breath.  Cough.  Weakness. COPD/bronchitis.  Cirrhosis.  Hypertension.  PORTABLE CHEST - 1 VIEW  Comparison: 01/20/2011  Findings: Underlying hyperinflation. Normal heart size.  Aortic atherosclerosis.  Left costophrenic angle excluded.  Eventration of the right hemidiaphragm. No pleural effusion or pneumothorax. Borderline prominence of the right hilum is similar back to last February and favored to be within normal variation.  Clear lungs.  Numerous leads and wires project over the chest.  IMPRESSION: COPD/chronic bronchitis. No acute superimposed process.  Original Report Authenticated By: Consuello Bossier, M.D.     Assessment/Plan:    79 YO female who presented to the hospital with confusion and libile BP.  During hospitalization her BP was as high as143/95 - 220/110.  Patients mental status has slowly returned to baseline.  MRI showed no acute infarct or PRESS. Per family members, patient will often become transiently confused when she is hypertensive.    Assessment: confusion most likely related to hypertension and resolving at present time.  Recommend:   1) EEG 2) continue with BP control 3) If negative EEG no further studies recommended per Neurology  Dr. Roseanne Reno has evaluated patient and agrees with the above mentioned.   Felicie Morn PA-C [facility] [phone]  04/22/2011, 11:46 AM

## 2011-04-22 NOTE — Progress Notes (Signed)
  Echocardiogram 2D Echocardiogram has been performed.  Taro Hidrogo Nira Retort 04/22/2011, 2:28 PM

## 2011-04-22 NOTE — Progress Notes (Signed)
04/22/2011 [name] SPARKS Case Management Note [phone]  Utilization review completed.  

## 2011-04-22 NOTE — Progress Notes (Signed)
Physical Therapy Evaluation Patient Details Name: [name] MRN: [id number] DOB: Aug 24, 1925 Today's Date: 04/22/2011  Problem List:  Patient Active Problem List  Diagnoses  . Altered mental status  . COPD (chronic obstructive pulmonary disease)  . Hypertension, accelerated  . Cirrhosis    Past Medical History:  Past Medical History  Diagnosis Date  . Hypertension   . Chronic back pain   . Obstructive chronic bronchitis   . Muscle weakness   . Cirrhosis    Past Surgical History: History reviewed. No pertinent past surgical history.  PT Assessment/Plan/Recommendation PT Assessment Clinical Impression Statement: Pt is an 78 y/o female admitted for hypertension. Pt performing functional mobility below her baseline as reported by pt and her family.  Pt will benefit from acute PT to maximize pt mobility in preparation for return to SNF.   PT Recommendation/Assessment: Patient will need skilled PT in the acute care venue PT Problem List: Decreased activity tolerance;Decreased mobility;Decreased safety awareness;Decreased knowledge of precautions Barriers to Discharge: None PT Therapy Diagnosis : Difficulty walking PT Plan PT Frequency: Min 3X/week PT Treatment/Interventions: Gait training;Therapeutic activities;Therapeutic exercise;Patient/family education;Functional mobility training PT Recommendation Follow Up Recommendations: Skilled nursing facility Equipment Recommended: Defer to next venue PT Goals  Acute Rehab PT Goals PT Goal Formulation: With patient/family Time For Goal Achievement: 2 weeks Pt will go Supine/Side to Sit: Independently PT Goal: Supine/Side to Sit - Progress: Not met Pt will go Sit to Supine/Side: Independently PT Goal: Sit to Supine/Side - Progress: Not met Pt will Transfer Bed to Chair/Chair to Bed: with modified independence PT Transfer Goal: Bed to Chair/Chair to Bed - Progress: Not met Pt will Ambulate: 51 - 150 feet;with supervision;with  rolling walker PT Goal: Ambulate - Progress: Not met  PT Evaluation Precautions/Restrictions  Precautions Precautions: Fall Prior Functioning  Home Living Lives With: Other (Comment) Receives Help From: Other (Comment) Type of Home: Skilled Nursing Facility Home Layout: One level Prior Function Level of Independence: Independent with basic ADLs;Independent with gait;Independent with transfers;Requires assistive device for independence Able to Take Stairs?: No Driving: No Vocation: Retired Leisure: Hobbies-no Comments: Pt lived in SNF ambulated independent with 4 wheeled walker.   Cognition Cognition Arousal/Alertness: Awake/alert Overall Cognitive Status: Appears within functional limits for tasks assessed Orientation Level: Oriented to person;Oriented to place;Oriented to time Sensation/Coordination Sensation Light Touch: Appears Intact Stereognosis: Not tested Hot/Cold: Not tested Proprioception: Appears Intact Coordination Gross Motor Movements are Fluid and Coordinated: Yes Extremity Assessment RUE Assessment RUE Assessment: Not tested LUE Assessment LUE Assessment: Not tested RLE Assessment RLE Assessment: Within Functional Limits LLE Assessment LLE Assessment: Within Functional Limits Mobility (including Balance) Bed Mobility Bed Mobility: Yes Rolling Right: 4: Min assist;Patient percentage (comment);With rail (pt 90%) Rolling Right Details (indicate cue type and reason): cues for technique and use of rail Right Sidelying to Sit: 4: Min assist;HOB flat;With rails;Patient percentage (comment) (pt 90%) Right Sidelying to Sit Details (indicate cue type and reason): assist for upper trunk management and cues for technique.  Sitting - Scoot to Edge of Bed: 6: Modified independent (Device/Increase time) Transfers Transfers: Yes Sit to Stand: 5: Supervision;From bed;With upper extremity assist Sit to Stand Details (indicate cue type and reason): Supervision for  safety tactile cues for anterior wt shift.   Stand to Sit: 5: Supervision;To chair/3-in-1;With upper extremity assist;With armrests Stand to Sit Details: verbal cues for hand placement.   Ambulation/Gait Ambulation/Gait: Yes Ambulation/Gait Assistance: 5: Supervision Ambulation/Gait Assistance Details (indicate cue type and reason): supervision for safety.  Pt moves  slowly but no physical assistance was required.  Pt fatigues quickly. Pt on room air during gait training and O2 sats >94 throughout session.    Ambulation Distance (Feet): 20 Feet Assistive device: Rolling walker Gait Pattern: Decreased stride length;Trunk flexed Gait velocity: slow Stairs: No Wheelchair Mobility Wheelchair Mobility: No  Posture/Postural Control Posture/Postural Control: Postural limitations Postural Limitations: forward head position.   Balance Balance Assessed: No Exercise    End of Session PT - End of Session Equipment Utilized During Treatment: Gait belt Activity Tolerance: Patient limited by fatigue Patient left: in chair Nurse Communication: Mobility status for transfers;Mobility status for ambulation General Behavior During Session: [facility] for tasks performed Cognition: [facility] for tasks performed  Alver Leete 04/22/2011, 4:02 PM Muneer Leider L. Falan Hensler DPT [phone]

## 2011-04-23 ENCOUNTER — Inpatient Hospital Stay (HOSPITAL_COMMUNITY): Payer: Medicare Other

## 2011-04-23 MED ORDER — ALBUTEROL SULFATE (5 MG/ML) 0.5% IN NEBU
2.5000 mg | INHALATION_SOLUTION | Freq: Four times a day (QID) | RESPIRATORY_TRACT | Status: DC | PRN
Start: 2011-04-23 — End: 2011-04-24

## 2011-04-23 MED ORDER — GUAIFENESIN ER 600 MG PO TB12
600.0000 mg | ORAL_TABLET | Freq: Two times a day (BID) | ORAL | Status: DC
  Administered 2011-04-24 (×2): 600 mg via ORAL
  Filled 2011-04-23 (×4): qty 1

## 2011-04-23 MED ORDER — LEVALBUTEROL HCL 0.63 MG/3ML IN NEBU
0.6300 mg | INHALATION_SOLUTION | Freq: Three times a day (TID) | RESPIRATORY_TRACT | Status: DC
  Administered 2011-04-23 – 2011-04-24 (×3): 0.63 mg via RESPIRATORY_TRACT
  Filled 2011-04-23 (×5): qty 3

## 2011-04-23 MED ORDER — GUAIFENESIN-DM 100-10 MG/5ML PO SYRP
10.0000 mL | ORAL_SOLUTION | ORAL | Status: DC | PRN
  Administered 2011-04-23: 10 mL via ORAL
  Filled 2011-04-23: qty 10

## 2011-04-23 NOTE — ED Provider Notes (Signed)
Medical screening examination/treatment/procedure(s) were conducted as a shared visit with non-physician practitioner(s) and myself.  I personally evaluated the patient during the encounter  Waxing and waning AMS. ?SOB pta although she denies at this time. Exam nonfocal. ED workup without cause. Admit to internal medicine for further work up and evaluation.   DNR/DNI  Forbes Cellar, MD 04/23/11 [phone]

## 2011-04-23 NOTE — Progress Notes (Signed)
Clinical Social Worker completed psychosocial assessment and placed in shadow chart. Pt is from [facility] and plans to return at discharge. Clinical Social Worker confirmed with facility that pt able to return when medically stable. Clinical Social Worker to facilitate pt discharge needs when pt medically ready for discharge.  Jacklynn Lewis, MSW, LCSWA  Clinical Social Work [phone]

## 2011-04-23 NOTE — Procedures (Signed)
EEG NUMBER:  REFERRING PHYSICIAN:  Dr. Irene Limbo.  HISTORY:  An 78 year old SNF resident with altered mental status.  MEDICATIONS:  Norvasc, Apresoline, aspirin, Lovenox, Catapres, Inderal.  CONDITIONS OF RECORDING:  This is a 16 channel EEG carried out with the patient in the awake state.  DESCRIPTION:  The waking background activity consists of a low-voltage, symmetrical, fairly well-organized 8-9 Hz alpha activity seen from the parieto-occipital and posterotemporal regions.  Low-voltage, fast activity, poorly organized was seen anteriorly at times, superimposed on more posterior rhythms.  A mixture of theta and alpha was seen from the central and temporal regions.  The patient does not drowse or sleep. Hypoventilation was not performed.  Intermittent photic stimulation elicited a symmetrical driving response, but failed to elicit any abnormalities.  IMPRESSION:  This is a normal awake EEG.  COMMENT:  An EEG with the patient sleep deprived to elicit drowse and light sleep, may be desirable to further elicit a possible seizure disorder.          ______________________________ Thana Farr, MD    [id number] D:  04/23/2011 19:05:16  T:  04/23/2011 22:23:04  Job #:  [id number]

## 2011-04-23 NOTE — Progress Notes (Signed)
PT Cancellation Note  Treatment cancelled today due to medical issues with patient which prohibited therapy. Per RN pt has low BP, RN requested no PT right now. Will follow.   Ralene Bathe Kistler 04/23/2011, 1:41 PM [phone]

## 2011-04-23 NOTE — Progress Notes (Addendum)
PROGRESS NOTE  [name] ZOX:[id number] DOB: [birth year] DOA: 04/21/2011 PCP: Terald Sleeper, MD, MD  Brief narrative: 78 year old woman from [facility] nursing home presented via EMS with history of altered mental status/unresponsiveness, cough and shortness of breath. Found to be markedly hypertensive. His mental status waxed and waned in the emergency department. She was admitted for altered mental status, stroke workup, EEG, hypertensive emergency.  She was seen in the emergency Department 01/08/2011 for hypertension. Seen 07/31/2010 for labile hypertension. Discharged 06/01/2010 with history of malignant hypertension.  Past medical history: DVT, GERD, hypertension, chronic back pain, COPD, cerebral aneurysm, congenital cleft palate, cryptogenic cirrhosis  Consultants:  Neurology  Speech therapy: Regular diet, thin liquids. Medications whole with liquids. No further followup required.  Occupational therapy: Skilled nursing facility  Physical therapy: Skilled nursing facility  Procedures:  EEG pending  January 14: Left ventricle: 2-D echocardiogram: The cavity size was normal. Systolic function was vigorous. The estimated ejection fraction was in the range of 65% to 70%. Wall motion was normal; there were no regional wall motion abnormalities.  Antibiotics:  None  Interim History: Interval documentation reviewed.  Subjective: Feels fine. No issues.  Objective: Filed Vitals:   04/22/11 2205 04/23/11 0300 04/23/11 0900 04/23/11 1131  BP: 183/83 99/60 88/58  106/61  Pulse: 81 75 78 77  Temp: 98.1 F (36.7 C) 98.3 F (36.8 C) 97.6 F (36.4 C) 97.4 F (36.3 C)  TempSrc: Oral Oral Oral Oral  Resp: 16 20 18 18   Height:      Weight:      SpO2: 98% 93% 94% 97%    Intake/Output Summary (Last 24 hours) at 04/23/11 1211 Last data filed at 04/23/11 1008  Gross per 24 hour  Intake    600 ml  Output    300 ml  Net    300 ml    Exam:  General: Appears  calm and comfortable. Cardiovascular: Regular rate and rhythm. No murmur, rub or gallop. No lower extremity edema. Telemetry: Sinus rhythm/sinus tachycardia. No arrhythmias. Respiratory: Clear to auscultation bilaterally. No wheezes, rales or rhonchi. Normal respiratory effort.  Data Reviewed: Basic Metabolic Panel:  Lab 04/21/11 8119 04/21/11 1009  NA 138 136  K 3.5 4.1  CL 99 102  CO2 25 24  GLUCOSE 114* 105*  BUN 17 38*  CREATININE 0.71 1.28*  CALCIUM 8.9 7.8*  MG -- --  PHOS -- --   Liver Function Tests:  Lab 04/21/11 2352 04/21/11 1009  AST 22 2663*  ALT 10 4535*  ALKPHOS 78 118*  BILITOT 1.0 2.4*  PROT 7.0 6.1  ALBUMIN 3.8 2.5*    Lab 04/21/11 1218  AMMONIA 14   CBC:  Lab 04/21/11 2352 04/21/11 1600 04/21/11 0938  WBC 10.2 10.5 8.8  NEUTROABS -- -- 6.8  HGB 13.2 13.8 13.6  HCT 39.5 40.1 38.7  MCV 87.8 87.6 87.6  PLT 236 208 199   Cardiac Enzymes:  Lab 04/22/11 0927 04/21/11 2352 04/21/11 1600 04/21/11 0938  CKTOTAL 72 68 44 42  CKMB 2.4 2.6 2.0 1.6  CKMBINDEX -- -- -- --  TROPONINI <0.30 <0.30 <0.30 <0.30   CBG:  Lab 04/21/11 1031  GLUCAP 124*    Recent Results (from the past 240 hour(s))  CULTURE, BLOOD (ROUTINE X 2)     Status: Normal (Preliminary result)   Collection Time   04/21/11 10:05 AM      Component Value Range Status Comment   Specimen Description BLOOD LEFT HAND   Final  Special Requests BOTTLES DRAWN AEROBIC ONLY 1CC   Final    Setup Time [id number]   Final    Culture     Final    Value:        BLOOD CULTURE RECEIVED NO GROWTH TO DATE CULTURE WILL BE HELD FOR 5 DAYS BEFORE ISSUING A FINAL NEGATIVE REPORT   Report Status PENDING   Incomplete   CULTURE, BLOOD (ROUTINE X 2)     Status: Normal (Preliminary result)   Collection Time   04/21/11 10:05 AM      Component Value Range Status Comment   Specimen Description BLOOD RIGHT ARM   Final    Special Requests BOTTLES DRAWN AEROBIC ONLY 3CC   Final    Setup Time [id number]    Final    Culture     Final    Value:        BLOOD CULTURE RECEIVED NO GROWTH TO DATE CULTURE WILL BE HELD FOR 5 DAYS BEFORE ISSUING A FINAL NEGATIVE REPORT   Report Status PENDING   Incomplete   MRSA PCR SCREENING     Status: Normal   Collection Time   04/21/11  4:00 PM      Component Value Range Status Comment   MRSA by PCR NEGATIVE  NEGATIVE  Final      Studies: Ct Head Wo Contrast  04/21/2011  *RADIOLOGY REPORT*  Clinical Data: 78 year old female with difficulty speaking, altered mental status, weakness.  CT HEAD WITHOUT CONTRAST  Technique:  Contiguous axial images were obtained from the base of the skull through the vertex without contrast.  Comparison: 01/20/2011 and earlier.  Findings: Chronic left sphenoid sinus disease.  Sequelae of left mastoidectomy.  Other Visualized paranasal sinuses and mastoids are clear.  Visualized orbit soft tissues are within normal limits. Right forehead scalp lipoma. No acute osseous abnormality identified.  Calcified atherosclerosis at the skull base.  No ventriculomegaly. No midline shift, mass effect, or evidence of mass lesion.  No evidence of cortically based acute infarction identified.  No acute intracranial hemorrhage identified.  Mild for age cerebral white matter hypodensity.  IMPRESSION: 1.  Stable and normal for age noncontrast CT appearance of the brain. 2.  Chronic left sphenoid sinus disease.  Original Report Authenticated By: Harley Hallmark, M.D.   Mr [facility] Head Wo Contrast  04/21/2011  **ADDENDUM** CREATED: 04/21/2011 16:25:49  Study discussed by telephone with Dr. Susie Cassette.  **END ADDENDUM** SIGNED BY: Harley Hallmark, M.D.    04/21/2011  *RADIOLOGY REPORT*  Clinical Data:  78 year old female with altered mental status, was unresponsive upon arrival.  Weakness.  Comparison: Head CTs without contrast 04/21/2011 and earlier.  MRI HEAD WITHOUT CONTRAST  Technique: Multiplanar, multiecho pulse sequences of the brain and surrounding structures were  obtained according to standard protocol without intravenous contrast.  Findings: No restricted diffusion to suggest acute infarction.  No midline shift, mass effect, evidence of mass lesion, ventriculomegaly, extra-axial collection or acute intracranial hemorrhage.  Cervicomedullary junction and pituitary are within normal limits.  Major intracranial vascular flow voids are preserved.  There is an abnormal flow void at the left MCA bifurcation, see MRA findings below.  Generalized T2 hyperintensity in the thalami and basal ganglia. Diffusion is facilitated in these areas.  There is no CT correlation of this finding earlier today.  T1 signal in the deep gray matter nuclei is preserved.  Scattered mild for age cerebral white matter T2 and FLAIR hyperintensity most resembles chronic small vessel disease.  The  brainstem and cerebellum are within normal limits.  Negative visualized cervical spine.  Chronic left sphenoid sinus inflammatory changes.  Other Visualized paranasal sinuses and mastoids are clear.  Visualized orbit soft tissues are within normal limits.  Small right forehead scalp lipoma. Visualized bone marrow signal is within normal limits.  IMPRESSION: 1.  No acute infarct.  Symmetric T2 signal abnormality in the deep gray matter nuclei is nonspecific and of unclear acuity.  Has there been a recent toxic exposure or is there an acute metabolic abnormality which might explain this appearance? 2.  Left MCA aneurysm suspected, see MRA findings below.  MRA HEAD WITHOUT CONTRAST  Technique: Angiographic images of the Circle of Willis were obtained using MRA technique without  intravenous contrast.  Findings: Study is moderately degraded by motion artifact despite repeated imaging attempts.  Antegrade flow in the posterior circulation.  Vertebral artery detail is limited.  The left vertebral artery appears dominant.  No basilar stenosis.  Superior cerebellar artery and left PCA origins are within normal limits.   There is a fetal type right PCA. Bilateral PCA branches are grossly normal.  Antegrade flow in both ICA siphons which are dolichoectatic, measuring up to 8 mm in diameter bilaterally.  The ophthalmic and right posterior communicating artery origins are normal.  The left posterior communicating artery is diminutive or absent. Carotid termini are patent.  MCA and ACA origins are within normal limits. Anterior communicating artery is within normal limits.  There is an azygos type ACA appearance.  Visualized right MCA branches are within normal limits.  The left MCA M1 segment is within normal limits.  Arising at the left MCA by for patient and directed anteriorly is a saccular aneurysm with a broad base of involvement  at the origin of the dominant left posterior sylvian division.  The aneurysm measures 7 x 8 x 5 mm.  Otherwise, the visualized left MCA M2 segments are within normal limits.  IMPRESSION: 1.  Aneurysm at the left MCA bifurcation measuring 7 x 8 x 5 mm. 2.  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 3.  Intermittently motion degraded despite repeated imaging attempts.  No major intracranial branch occlusion or stenosis identified. Original Report Authenticated By: Harley Hallmark, M.D.   Dg Chest Port 1 View  04/21/2011  *RADIOLOGY REPORT*  Clinical Data: Short of breath.  Cough.  Weakness. COPD/bronchitis.  Cirrhosis.  Hypertension.  PORTABLE CHEST - 1 VIEW  Comparison: 01/20/2011  Findings: Underlying hyperinflation. Normal heart size.  Aortic atherosclerosis.  Left costophrenic angle excluded.  Eventration of the right hemidiaphragm. No pleural effusion or pneumothorax. Borderline prominence of the right hilum is similar back to last February and favored to be within normal variation.  Clear lungs.  Numerous leads and wires project over the chest.  IMPRESSION: COPD/chronic bronchitis. No acute superimposed process.  Original Report Authenticated By: Consuello Bossier,  M.D.    Scheduled Meds:    . amLODipine  5 mg Oral Daily  . aspirin  300 mg Rectal Daily  . enoxaparin  40 mg Subcutaneous Q24H  . hydrALAZINE  100 mg Oral Q8H  . levalbuterol  0.63 mg Nebulization Q6H  . prazosin  2 mg Oral QHS  . propranolol  160 mg Oral Daily   Continuous Infusions:    Assessment/Plan: 1. Acute encephalopathy: Resolved. Almost certainly secondary to hypertensive emergency. MRI of the brain abnormal but not felt to be significant per neurology. MRA demonstrates aneurysms, again not felt  to be significant by neurology. EEG pending. 2. Acute renal failure: Resolved. 3. Uncontrolled hypertension: Now well controlled. Continue chronic antihypertensives.   4. Cirrhosis?: Marked transaminitis has completely resolved today. The significance of the initial testing is unclear. No further testing suggested. Followup as outpatient.  Discussed with daughter at bedside. Anticipate discharge January 16 if EEG unremarkable.  I left a message for the healthcare power of attorney on her cell phone.  Code Status: DO NOT RESUSCITATE Family Communication: [name]: [phone] work, cell [phone]  Disposition Plan: Pending further evaluation.   Brendia Sacks, MD  [facility] Pager [phone] 04/23/2011, 12:11 PM    LOS: 2 days

## 2011-04-24 DIAGNOSIS — I674 Hypertensive encephalopathy: Principal | ICD-10-CM | POA: Diagnosis present

## 2011-04-24 MED ORDER — ALBUTEROL SULFATE (5 MG/ML) 0.5% IN NEBU: 2.5000 mg | INHALATION_SOLUTION | Freq: Four times a day (QID) | RESPIRATORY_TRACT | Status: DC | PRN

## 2011-04-24 MED ORDER — ALBUTEROL 90 MCG/ACT IN AERS: 2.0000 | INHALATION_SPRAY | Freq: Four times a day (QID) | RESPIRATORY_TRACT | Status: DC | PRN

## 2011-04-24 MED ORDER — GUAIFENESIN ER 600 MG PO TB12: 600.0000 mg | ORAL_TABLET | Freq: Two times a day (BID) | ORAL | Status: DC

## 2011-04-24 NOTE — Progress Notes (Signed)
04/24/2011 patient saturation was 91 at rest on room air. Patient ambulate without oxygen and saturation was 93%. Biochemist, clinical.

## 2011-04-24 NOTE — Progress Notes (Signed)
Physical Therapy Treatment Patient Details Name: [name] MRN: [id number] DOB: 06/27/1925 Today's Date: 04/24/2011  PT Assessment/Plan  PT - Assessment/Plan Comments on Treatment Session: Patient requesting to use bathroom upon arrival and to ambulate into bathroom.   PT Plan: Discharge plan remains appropriate PT Frequency: Min 3X/week Follow Up Recommendations: Skilled nursing facility Equipment Recommended: Defer to next venue PT Goals  Acute Rehab PT Goals PT Goal: Ambulate - Progress: Progressing toward goal  PT Treatment Precautions/Restrictions  Precautions Precautions: Fall Required Braces or Orthoses: No Restrictions Weight Bearing Restrictions: No Mobility (including Balance) Bed Mobility Bed Mobility: No Transfers Transfers: Yes Sit to Stand: 5: Supervision;With upper extremity assist;From chair/3-in-1 Sit to Stand Details (indicate cue type and reason):  (verbal cues for safety and hand placement) Stand to Sit: 5: Supervision;With upper extremity assist;To chair/3-in-1 Stand to Sit Details: verbal cues for hand placement Ambulation/Gait Ambulation/Gait: Yes Ambulation/Gait Assistance: 5: Supervision Ambulation/Gait Assistance Details (indicate cue type and reason): Pt continues to move slowly.  Pt on room air during treatment and O2 sats >95% on room air. Ambulation Distance (Feet): 20 Feet (x2) Assistive device: Rolling walker Gait Pattern: Step-to pattern;Decreased stride length;Trunk flexed Gait velocity: slow Stairs: No Wheelchair Mobility Wheelchair Mobility: No  Balance Balance Assessed: No   End of Session PT - End of Session Equipment Utilized During Treatment: Gait belt Activity Tolerance: Patient limited by fatigue Patient left: in bed;with call bell in reach General Behavior During Session: [facility] for tasks performed Cognition: [facility] for tasks performed  Newell Coral 04/24/2011, 11:43 AM Newell Coral,  PTA [phone]

## 2011-04-24 NOTE — Discharge Summary (Signed)
Patient ID: DELMY HOLDREN MRN: [id number] DOB/AGE: Feb 09, 1926 78 y.o.  Admit date: 04/21/2011 Discharge date: 04/24/2011  Primary Care Physician:  Terald Sleeper, MD, MD  Discharge Diagnoses:    Principal Problem:  *Hypertensive encephalopathy  Active Problems:  Altered mental status  COPD (chronic obstructive pulmonary disease)  Hypertension, accelerated Left MCA aneurysm  Cirrhosis   Current Discharge Medication List    START taking these medications   Details  albuterol (PROVENTIL) (5 MG/ML) 0.5% nebulizer solution Take 0.5 mLs (2.5 mg total) by nebulization every 6 (six) hours as needed for wheezing or shortness of breath. Qty: 20 mL, Refills: 0    albuterol (PROVENTIL,VENTOLIN) 90 MCG/ACT inhaler Inhale 2 puffs into the lungs every 6 (six) hours as needed for wheezing or shortness of breath. Qty: 17 g, Refills: 5    guaiFENesin (MUCINEX) 600 MG 12 hr tablet Take 1 tablet (600 mg total) by mouth 2 (two) times daily. Qty: 15 tablet, Refills: 0      CONTINUE these medications which have NOT CHANGED   Details  amLODipine (NORVASC) 5 MG tablet Take 5 mg by mouth daily.    aspirin 81 MG chewable tablet Chew 81 mg by mouth daily.    docusate sodium (COLACE) 100 MG capsule Take 100 mg by mouth 2 (two) times daily.    famotidine (PEPCID) 20 MG tablet Take 20 mg by mouth 2 (two) times daily.    FeFum-FePo-FA-B Cmp-C-Zn-Mn-Cu (TANDEM PLUS) 162-115.2-1 MG CAPS Take 1 capsule by mouth daily.    gabapentin (NEURONTIN) 100 MG capsule Take 100 mg by mouth at bedtime. Take with 600mg  tablet to equal 700mg  daily    gabapentin (NEURONTIN) 600 MG tablet Take 600 mg by mouth at bedtime. Take with 100mg  capsule to equal 700mg  daily    hydrALAZINE (APRESOLINE) 100 MG tablet Take 100 mg by mouth 3 (three) times daily.    lisinopril (PRINIVIL,ZESTRIL) 20 MG tablet Take 20 mg by mouth daily.    prazosin (MINIPRESS) 2 MG capsule Take 2 mg by mouth at bedtime.    propranolol  (INDERAL LA) 160 MG SR capsule Take 160 mg by mouth daily.    zolpidem (AMBIEN) 5 MG tablet Take 5 mg by mouth at bedtime as needed. For sleep        Disposition and Follow-up:  Follow up with PCP in 1 week  Consults:   neurology  Significant Diagnostic Studies:  Ct Head Wo Contrast  04/21/2011  *RADIOLOGY REPORT*  Clinical Data: 78 year old female with difficulty speaking, altered mental status, weakness.  CT HEAD WITHOUT CONTRAST  Technique:  Contiguous axial images were obtained from the base of the skull through the vertex without contrast.  Comparison: 01/20/2011 and earlier.  Findings: Chronic left sphenoid sinus disease.  Sequelae of left mastoidectomy.  Other Visualized paranasal sinuses and mastoids are clear.  Visualized orbit soft tissues are within normal limits. Right forehead scalp lipoma. No acute osseous abnormality identified.  Calcified atherosclerosis at the skull base.  No ventriculomegaly. No midline shift, mass effect, or evidence of mass lesion.  No evidence of cortically based acute infarction identified.  No acute intracranial hemorrhage identified.  Mild for age cerebral white matter hypodensity.  IMPRESSION: 1.  Stable and normal for age noncontrast CT appearance of the brain. 2.  Chronic left sphenoid sinus disease.  Original Report Authenticated By: Harley Hallmark, M.D.   Mr [facility] Head Wo Contrast  04/21/2011  **ADDENDUM** CREATED: 04/21/2011 16:25:49  Study discussed by telephone with Dr. Susie Cassette.  **  END ADDENDUM** SIGNED BY: Harley Hallmark, M.D.    04/21/2011  *RADIOLOGY REPORT*  Clinical Data:  78 year old female with altered mental status, was unresponsive upon arrival.  Weakness.  Comparison: Head CTs without contrast 04/21/2011 and earlier.  MRI HEAD WITHOUT CONTRAST  Technique: Multiplanar, multiecho pulse sequences of the brain and surrounding structures were obtained according to standard protocol without intravenous contrast.  Findings: No restricted diffusion to  suggest acute infarction.  No midline shift, mass effect, evidence of mass lesion, ventriculomegaly, extra-axial collection or acute intracranial hemorrhage.  Cervicomedullary junction and pituitary are within normal limits.  Major intracranial vascular flow voids are preserved.  There is an abnormal flow void at the left MCA bifurcation, see MRA findings below.  Generalized T2 hyperintensity in the thalami and basal ganglia. Diffusion is facilitated in these areas.  There is no CT correlation of this finding earlier today.  T1 signal in the deep gray matter nuclei is preserved.  Scattered mild for age cerebral white matter T2 and FLAIR hyperintensity most resembles chronic small vessel disease.  The brainstem and cerebellum are within normal limits.  Negative visualized cervical spine.  Chronic left sphenoid sinus inflammatory changes.  Other Visualized paranasal sinuses and mastoids are clear.  Visualized orbit soft tissues are within normal limits.  Small right forehead scalp lipoma. Visualized bone marrow signal is within normal limits.  IMPRESSION: 1.  No acute infarct.  Symmetric T2 signal abnormality in the deep gray matter nuclei is nonspecific and of unclear acuity.  Has there been a recent toxic exposure or is there an acute metabolic abnormality which might explain this appearance? 2.  Left MCA aneurysm suspected, see MRA findings below.  MRA HEAD WITHOUT CONTRAST  Technique: Angiographic images of the Circle of Willis were obtained using MRA technique without  intravenous contrast.  Findings: Study is moderately degraded by motion artifact despite repeated imaging attempts.  Antegrade flow in the posterior circulation.  Vertebral artery detail is limited.  The left vertebral artery appears dominant.  No basilar stenosis.  Superior cerebellar artery and left PCA origins are within normal limits.  There is a fetal type right PCA. Bilateral PCA branches are grossly normal.  Antegrade flow in both ICA  siphons which are dolichoectatic, measuring up to 8 mm in diameter bilaterally.  The ophthalmic and right posterior communicating artery origins are normal.  The left posterior communicating artery is diminutive or absent. Carotid termini are patent.  MCA and ACA origins are within normal limits. Anterior communicating artery is within normal limits.  There is an azygos type ACA appearance.  Visualized right MCA branches are within normal limits.  The left MCA M1 segment is within normal limits.  Arising at the left MCA by for patient and directed anteriorly is a saccular aneurysm with a broad base of involvement  at the origin of the dominant left posterior sylvian division.  The aneurysm measures 7 x 8 x 5 mm.  Otherwise, the visualized left MCA M2 segments are within normal limits.  IMPRESSION: 1.  Aneurysm at the left MCA bifurcation measuring 7 x 8 x 5 mm. 2.  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 3.  Intermittently motion degraded despite repeated imaging attempts.  No major intracranial branch occlusion or stenosis identified. Original Report Authenticated By: Harley Hallmark, M.D.   Mr Brain Wo Contrast  04/21/2011  **ADDENDUM** CREATED: 04/21/2011 16:25:49  Study discussed by telephone with Dr. Susie Cassette.  **END ADDENDUM** SIGNED BY:  Harley Hallmark, M.D.    04/21/2011  *RADIOLOGY REPORT*  Clinical Data:  78 year old female with altered mental status, was unresponsive upon arrival.  Weakness.  Comparison: Head CTs without contrast 04/21/2011 and earlier.  MRI HEAD WITHOUT CONTRAST  Technique: Multiplanar, multiecho pulse sequences of the brain and surrounding structures were obtained according to standard protocol without intravenous contrast.  Findings: No restricted diffusion to suggest acute infarction.  No midline shift, mass effect, evidence of mass lesion, ventriculomegaly, extra-axial collection or acute intracranial hemorrhage.  Cervicomedullary junction and  pituitary are within normal limits.  Major intracranial vascular flow voids are preserved.  There is an abnormal flow void at the left MCA bifurcation, see MRA findings below.  Generalized T2 hyperintensity in the thalami and basal ganglia. Diffusion is facilitated in these areas.  There is no CT correlation of this finding earlier today.  T1 signal in the deep gray matter nuclei is preserved.  Scattered mild for age cerebral white matter T2 and FLAIR hyperintensity most resembles chronic small vessel disease.  The brainstem and cerebellum are within normal limits.  Negative visualized cervical spine.  Chronic left sphenoid sinus inflammatory changes.  Other Visualized paranasal sinuses and mastoids are clear.  Visualized orbit soft tissues are within normal limits.  Small right forehead scalp lipoma. Visualized bone marrow signal is within normal limits.  IMPRESSION: 1.  No acute infarct.  Symmetric T2 signal abnormality in the deep gray matter nuclei is nonspecific and of unclear acuity.  Has there been a recent toxic exposure or is there an acute metabolic abnormality which might explain this appearance? 2.  Left MCA aneurysm suspected, see MRA findings below.  MRA HEAD WITHOUT CONTRAST  Technique: Angiographic images of the Circle of Willis were obtained using MRA technique without  intravenous contrast.  Findings: Study is moderately degraded by motion artifact despite repeated imaging attempts.  Antegrade flow in the posterior circulation.  Vertebral artery detail is limited.  The left vertebral artery appears dominant.  No basilar stenosis.  Superior cerebellar artery and left PCA origins are within normal limits.  There is a fetal type right PCA. Bilateral PCA branches are grossly normal.  Antegrade flow in both ICA siphons which are dolichoectatic, measuring up to 8 mm in diameter bilaterally.  The ophthalmic and right posterior communicating artery origins are normal.  The left posterior communicating  artery is diminutive or absent. Carotid termini are patent.  MCA and ACA origins are within normal limits. Anterior communicating artery is within normal limits.  There is an azygos type ACA appearance.  Visualized right MCA branches are within normal limits.  The left MCA M1 segment is within normal limits.  Arising at the left MCA by for patient and directed anteriorly is a saccular aneurysm with a broad base of involvement  at the origin of the dominant left posterior sylvian division.  The aneurysm measures 7 x 8 x 5 mm.  Otherwise, the visualized left MCA M2 segments are within normal limits.  IMPRESSION: 1.  Aneurysm at the left MCA bifurcation measuring 7 x 8 x 5 mm. 2.  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 3.  Intermittently motion degraded despite repeated imaging attempts.  No major intracranial branch occlusion or stenosis identified. Original Report Authenticated By: Harley Hallmark, M.D.   Dg Chest Port 1 View  04/21/2011  *RADIOLOGY REPORT*  Clinical Data: Short of breath.  Cough.  Weakness. COPD/bronchitis.  Cirrhosis.  Hypertension.  PORTABLE CHEST -  1 VIEW  Comparison: 01/20/2011  Findings: Underlying hyperinflation. Normal heart size.  Aortic atherosclerosis.  Left costophrenic angle excluded.  Eventration of the right hemidiaphragm. No pleural effusion or pneumothorax. Borderline prominence of the right hilum is similar back to last February and favored to be within normal variation.  Clear lungs.  Numerous leads and wires project over the chest.  IMPRESSION: COPD/chronic bronchitis. No acute superimposed process.  Original Report Authenticated By: Consuello Bossier, M.D.    Brief H and P: For complete details please refer to admission H and P, but in brief 78 year old female  resident of nursing facility that was sent to the ED apparently due to unresponsiveness.Upon Arrival the patient is more responsive and complaining of cough and shortness of  breath. Patient is unable to give me much information , is a poor historian , other than that she short of breath and having cough. She is also hypertesive with sbp > 200. The patient is not symptomatic,  but her blood pressure has apparently been high. The highest said to be  around 200 systolic. She is on multiple medications including  hydralazine, terazosin. Also on propranolol. At [facility] . Denies any CP, SOB,  She is in and out of consicousness.No hypercarbia on ABG, CT head is negative.      Physical Exam on Discharge:  Filed Vitals:   04/24/11 0405 04/24/11 0722 04/24/11 0922 04/24/11 1408  BP: 144/70  108/55   Pulse: 86  80   Temp: 98.4 F (36.9 C)  98.2 F (36.8 C)   TempSrc: Oral  Oral   Resp: 18  18   Height:      Weight:      SpO2: 93% 96% 97% 95%     Intake/Output Summary (Last 24 hours) at 04/24/11 1430 Last data filed at 04/24/11 0910  Gross per 24 hour  Intake    840 ml  Output    800 ml  Net     40 ml    General: elderly female in no acute distress. HEENT: no pallor, moist oral mucosa Heart: Regular rate and rhythm, without murmurs, rubs, gallops. Lungs: Clear to auscultation bilaterally. Abdomen: Soft, nontender, nondistended, positive bowel sounds. Extremities: No clubbing cyanosis or edema with positive pedal pulses. Neuro: Grossly intact, nonfocal.  CBC:    Component Value Date/Time   WBC 10.2 04/21/2011 2352   HGB 13.2 04/21/2011 2352   HCT 39.5 04/21/2011 2352   PLT 236 04/21/2011 2352   MCV 87.8 04/21/2011 2352   NEUTROABS 6.8 04/21/2011 0938   LYMPHSABS 1.2 04/21/2011 0938   MONOABS 0.6 04/21/2011 0938   EOSABS 0.1 04/21/2011 0938   BASOSABS 0.0 04/21/2011 0938    Basic Metabolic Panel:    Component Value Date/Time   NA 138 04/21/2011 2352   K 3.5 04/21/2011 2352   CL 99 04/21/2011 2352   CO2 25 04/21/2011 2352   BUN 17 04/21/2011 2352   CREATININE 0.71 04/21/2011 2352   GLUCOSE 114* 04/21/2011 2352   CALCIUM 8.9 04/21/2011 2352    Hospital  Course:   Acute encephalopathy:  Resolved. Very likely  secondary to hypertensive emergency. MRA of the brain shows a left  MCA aneurysm  but not felt to be significant to causing these symptoms per neurology. EEG done and was negative for seizure activity. She is clinically stable and her encephalopathy has  now resolved.    COPD (chronic obstructive pulmonary disease) patient not on any medication for underlying COPD and no PFTs  in the system. Her O2 sat have been stable on room ari and she should be monitored for desaturations during sleep and would benefit from checking a nocturnal pulse oxymetry at the facility.  I will discharge her on albuterol nebs and puffs prn She would benefit from outpatient PFTs    Hypertension, accelerated Patient noted to have uncontrolled  HTN on presentation. BP better controlled now. She appears to have symptoms of AMS with elevated BP as per family. Will need close monitoring of her BP as outpatient. Her BP is currently stable on home meds which she will continue   ?Cirrhosis Marked transaminitis noted on presentation which has completely resolved now . The significance of the initial testing is unclear. No further testing suggested. Followup as outpatient.   patient is clinically stable to be discharged back to SNF Time spent on Discharge: 45 minutes  Signed: Eddie North 04/24/2011, 2:30 PM

## 2011-04-24 NOTE — Progress Notes (Signed)
Clinical Social Worker facilitated pt discharge needs including contacting facility and arranging ambulance transportation to [facility]. Pt family were at bedside and notified that transportation was arranged. No further social work needs at this time.  Jacklynn Lewis, MSW, LCSWA  Clinical Social Work [phone]

## 2011-04-29 ENCOUNTER — Other Ambulatory Visit (HOSPITAL_COMMUNITY): Payer: Self-pay | Admitting: Internal Medicine

## 2011-04-29 ENCOUNTER — Other Ambulatory Visit (HOSPITAL_COMMUNITY): Payer: Self-pay

## 2011-04-29 DIAGNOSIS — R0602 Shortness of breath: Secondary | ICD-10-CM

## 2011-04-30 ENCOUNTER — Ambulatory Visit (HOSPITAL_COMMUNITY)
Admission: RE | Admit: 2011-04-30 | Discharge: 2011-04-30 | Disposition: A | Discharge status: Home / Self Care | Payer: Medicare Other | Source: Ambulatory Visit | Attending: Internal Medicine | Admitting: Internal Medicine

## 2011-04-30 NOTE — Progress Notes (Signed)
Patient was unable to perform pulmonary function maneuvers., due to low stamina.

## 2011-06-03 ENCOUNTER — Emergency Department (HOSPITAL_COMMUNITY)
Admission: EM | Admit: 2011-06-03 | Discharge: 2011-06-03 | Disposition: A | Discharge status: Skilled Nursing Facility | Payer: Medicare Other | Attending: Emergency Medicine | Admitting: Emergency Medicine

## 2011-06-03 ENCOUNTER — Encounter (HOSPITAL_COMMUNITY): Payer: Self-pay | Admitting: Emergency Medicine

## 2011-06-03 ENCOUNTER — Emergency Department (HOSPITAL_COMMUNITY): Payer: Medicare Other

## 2011-06-03 DIAGNOSIS — R112 Nausea with vomiting, unspecified: Secondary | ICD-10-CM | POA: Insufficient documentation

## 2011-06-03 DIAGNOSIS — M546 Pain in thoracic spine: Secondary | ICD-10-CM | POA: Insufficient documentation

## 2011-06-03 DIAGNOSIS — K7460 Unspecified cirrhosis of liver: Secondary | ICD-10-CM | POA: Insufficient documentation

## 2011-06-03 DIAGNOSIS — Z79899 Other long term (current) drug therapy: Secondary | ICD-10-CM | POA: Insufficient documentation

## 2011-06-03 DIAGNOSIS — G8929 Other chronic pain: Secondary | ICD-10-CM | POA: Insufficient documentation

## 2011-06-03 DIAGNOSIS — Z7982 Long term (current) use of aspirin: Secondary | ICD-10-CM | POA: Insufficient documentation

## 2011-06-03 DIAGNOSIS — I10 Essential (primary) hypertension: Secondary | ICD-10-CM | POA: Insufficient documentation

## 2011-06-03 MED ORDER — TRAMADOL HCL 50 MG PO TABS: 50.0000 mg | ORAL_TABLET | Freq: Four times a day (QID) | ORAL | Status: AC | PRN

## 2011-06-03 MED ORDER — ONDANSETRON 4 MG PO TBDP
8.0000 mg | ORAL_TABLET | Freq: Once | ORAL | Status: AC
  Administered 2011-06-03: 8 mg via ORAL
  Filled 2011-06-03: qty 2

## 2011-06-03 MED ORDER — MORPHINE SULFATE 4 MG/ML IJ SOLN
4.0000 mg | INTRAMUSCULAR | Status: AC
  Administered 2011-06-03: 4 mg via INTRAMUSCULAR
  Filled 2011-06-03: qty 1

## 2011-06-03 NOTE — ED Notes (Signed)
Pt reports that she is pain free.  NAD. VSS.  No N/V/D. Snack given prior to transport back to NH.

## 2011-06-03 NOTE — ED Provider Notes (Signed)
History     CSN: [id number]  Arrival date & time 06/03/11  1404   First MD Initiated Contact with Patient 06/03/11 1412      Chief Complaint  Patient presents with  . Back Pain  . Nausea    (Consider location/radiation/quality/duration/timing/severity/associated sxs/prior treatment) HPI Patient is 78 yo white female with a history of chronic back pain, cirrhosis, HTN, and anuerysm behind her left eye,  who is brought into the ED by EMS from a nursing facility. The patient has a history of chronic back pain due to scoliosis, and reports that her pain comes and goes. Today she has increased pain on the right side of her back. She reports that the pain is so bad it is causing her to be nauseous, and she has vomited once this morning. The nursing facility was concerned that her BP rose to over 200 systolic and sent her to the ED to be evaluated for her pain and BP. The patient is accompanied by her granddaughter who is healthcare power of attorney. Patient denies recent falls or trauma. Patient ambulates with a walker. Patient denies headaches, dizziness, lightheadedness, shortness of breath, chest pain. Patient is currently nauseated, but only vomited once this morning She said they gave her some pain medicine at the nursing facility, but it has not helped much. Patient reports that she is currently taking antibiotic for UTI, but cannot recall the name. Patient denies changes in bowel habits and pain or weakness in extremities. Patient is allergic to penicillin and was instructed not to take APAP due to cirrhosis.  Past Medical History  Diagnosis Date  . Hypertension   . Chronic back pain   . Obstructive chronic bronchitis   . Muscle weakness   . Cirrhosis     Past Surgical History  Procedure Date  . Abdominal hysterectomy     No family history on file.  History  Substance Use Topics  . Smoking status: Never Smoker   . Smokeless tobacco: Not on file  . Alcohol Use: No    OB  History    Grav Para Term Preterm Abortions TAB SAB Ect Mult Living                  Review of Systems All pertinent positives and negatives reviewed in the history of present illness  Allergies  Acetaminophen and Penicillins  Home Medications   Current Outpatient Rx  Name Route Sig Dispense Refill  . AMLODIPINE BESYLATE 5 MG PO TABS Oral Take 5 mg by mouth daily.    . ASPIRIN 81 MG PO CHEW Oral Chew 81 mg by mouth daily.    Marland Kitchen DOCUSATE SODIUM 100 MG PO CAPS Oral Take 100 mg by mouth 2 (two) times daily.    Marland Kitchen FAMOTIDINE 20 MG PO TABS Oral Take 20 mg by mouth 2 (two) times daily.    Marland Kitchen TANDEM PLUS 162-115.2-1 MG PO CAPS Oral Take 1 capsule by mouth daily.    Marland Kitchen GABAPENTIN 400 MG PO CAPS Oral Take 400 mg by mouth at bedtime. Take with 300 mg cap to equal 700 mg    . GABAPENTIN 600 MG PO TABS Oral Take 600 mg by mouth at bedtime. Take with 100mg  capsule to equal 700mg  daily    . HYDRALAZINE HCL 100 MG PO TABS Oral Take 100 mg by mouth 3 (three) times daily.    . IBUPROFEN 200 MG PO TABS Oral Take 200 mg by mouth every 8 (eight) hours as needed. For  pain    . LISINOPRIL 20 MG PO TABS Oral Take 20 mg by mouth daily.    Marland Kitchen MILK OF MAGNESIA PO Oral Take 30 mLs by mouth daily as needed. For constipation    . CENTRUM SILVER PO TABS Oral Take 1 tablet by mouth daily.    Marland Kitchen PRAZOSIN HCL 2 MG PO CAPS Oral Take 2 mg by mouth 2 (two) times daily.     Marland Kitchen PROPRANOLOL HCL ER 160 MG PO CP24 Oral Take 160 mg by mouth daily.    . ALBUTEROL SULFATE (5 MG/ML) 0.5% IN NEBU Nebulization Take 0.5 mLs (2.5 mg total) by nebulization every 6 (six) hours as needed for wheezing or shortness of breath. 20 mL 0  . ALBUTEROL 90 MCG/ACT IN AERS Inhalation Inhale 2 puffs into the lungs every 6 (six) hours as needed for wheezing or shortness of breath. 17 g 5  . ZOLPIDEM TARTRATE 5 MG PO TABS Oral Take 5 mg by mouth at bedtime as needed. For sleep      BP 158/85  Pulse 76  Temp(Src) 97.8 F (36.6 C) (Oral)  Resp 18   SpO2 94%  Physical Exam  Constitutional: She is oriented to person, place, and time. She appears well-developed and well-nourished. No distress.  Eyes: Pupils are equal, round, and reactive to light.  Neck: Neck supple.  Cardiovascular: Normal rate, regular rhythm and normal heart sounds.   Pulmonary/Chest: Breath sounds normal. No respiratory distress.  Abdominal: Soft. Bowel sounds are normal. She exhibits no distension. There is no tenderness.  Musculoskeletal:       Lumbar back: She exhibits tenderness and pain. She exhibits no bony tenderness.       Back:       Arms: Lymphadenopathy:    She has no cervical adenopathy.  Neurological: She is alert and oriented to person, place, and time. She displays normal reflexes. She exhibits normal muscle tone. Coordination normal.  Skin: Skin is warm and dry. She is not diaphoretic.    ED Course  Procedures (including critical care time)   The patient has no new symptoms with her chronic back pain. The patient does not have any neurological deficits noted on exam. The patient will be advised to return here as needed. She is asked to follow up with her PCP.        MDM  MDM Interpretation: x-ray            Carlyle Dolly, PA-C 06/04/11 [phone]

## 2011-06-03 NOTE — Discharge Instructions (Signed)
Follow up with your doctor.  Return here as needed 

## 2011-06-03 NOTE — ED Notes (Addendum)
Per ems- pt coming from nursing facility where she was sent here for high bp and back pain.  Pt was given ibuprofen  pta.  ems state pt was nauseated at nursing facility due to back pain.

## 2011-06-03 NOTE — ED Notes (Signed)
Pt moved from room to hallway to vacate negative airflow room for a shingles patient.

## 2011-06-03 NOTE — ED Notes (Signed)
[facility] called for transport back to [facility].

## 2011-06-03 NOTE — ED Provider Notes (Signed)
Patient has history of chronic back pain intermittently. She relates recently she found out she has an aneurysm behind her left eye that is inoperable. She relates today her back pain got worse which she can do. However when they checked her blood pressure at the nursing home it got as high as 240/130 and refer history of aneurysm they became concerned and sent her to the emergency department. She denies any headache.  Patient has a speech impediment. She is alert and cooperative she easily rolls over and shows me her pain she appears to be along the right paraspinous area of her lumbar spine. Her blood pressure is 156/75 during my exam.  Medical screening examination/treatment/procedure(s) were conducted as a shared visit with non-physician practitioner(s) and myself.  I personally evaluated the patient during the encounter Devoria Albe, MD, Franz Dell, MD 06/03/11 [phone]

## 2011-06-04 NOTE — ED Provider Notes (Signed)
See prior note   Ward Givens, MD 06/04/11 [phone]

## 2011-07-08 DIAGNOSIS — I671 Cerebral aneurysm, nonruptured: Secondary | ICD-10-CM

## 2011-07-08 HISTORY — DX: Cerebral aneurysm, nonruptured: I67.1

## 2011-08-07 HISTORY — PX: MYRINGOTOMY: SUR874

## 2011-08-15 ENCOUNTER — Inpatient Hospital Stay (HOSPITAL_COMMUNITY)
Admission: EM | Admit: 2011-08-15 | Discharge: 2011-08-19 | DRG: 078 | Payer: Medicare Other | Attending: Internal Medicine | Admitting: Internal Medicine

## 2011-08-15 ENCOUNTER — Encounter (HOSPITAL_COMMUNITY): Payer: Self-pay | Admitting: *Deleted

## 2011-08-15 ENCOUNTER — Emergency Department (HOSPITAL_COMMUNITY): Payer: Medicare Other

## 2011-08-15 DIAGNOSIS — K7460 Unspecified cirrhosis of liver: Secondary | ICD-10-CM

## 2011-08-15 DIAGNOSIS — R5381 Other malaise: Secondary | ICD-10-CM | POA: Diagnosis present

## 2011-08-15 DIAGNOSIS — J449 Chronic obstructive pulmonary disease, unspecified: Secondary | ICD-10-CM | POA: Diagnosis present

## 2011-08-15 DIAGNOSIS — R569 Unspecified convulsions: Secondary | ICD-10-CM

## 2011-08-15 DIAGNOSIS — I674 Hypertensive encephalopathy: Principal | ICD-10-CM | POA: Diagnosis present

## 2011-08-15 DIAGNOSIS — R0602 Shortness of breath: Secondary | ICD-10-CM

## 2011-08-15 DIAGNOSIS — J4489 Other specified chronic obstructive pulmonary disease: Secondary | ICD-10-CM | POA: Diagnosis present

## 2011-08-15 DIAGNOSIS — I10 Essential (primary) hypertension: Secondary | ICD-10-CM

## 2011-08-15 DIAGNOSIS — R4182 Altered mental status, unspecified: Secondary | ICD-10-CM | POA: Diagnosis present

## 2011-08-15 DIAGNOSIS — R627 Adult failure to thrive: Secondary | ICD-10-CM | POA: Diagnosis present

## 2011-08-15 DIAGNOSIS — R404 Transient alteration of awareness: Secondary | ICD-10-CM

## 2011-08-15 DIAGNOSIS — M47817 Spondylosis without myelopathy or radiculopathy, lumbosacral region: Secondary | ICD-10-CM | POA: Diagnosis present

## 2011-08-15 DIAGNOSIS — Z7982 Long term (current) use of aspirin: Secondary | ICD-10-CM

## 2011-08-15 DIAGNOSIS — R5383 Other fatigue: Secondary | ICD-10-CM

## 2011-08-15 HISTORY — DX: Gastro-esophageal reflux disease without esophagitis: K21.9

## 2011-08-15 HISTORY — DX: Unspecified hearing loss, right ear: H91.91

## 2011-08-15 HISTORY — DX: Scoliosis, unspecified: M41.9

## 2011-08-15 HISTORY — DX: Presence of cardiac pacemaker: Z95.0

## 2011-08-15 HISTORY — DX: Unspecified hearing loss, left ear: H91.92

## 2011-08-15 HISTORY — DX: Acute embolism and thrombosis of unspecified deep veins of right lower extremity: I82.401

## 2011-08-15 HISTORY — DX: Headache: R51

## 2011-08-15 HISTORY — DX: Hypertensive encephalopathy: I67.4

## 2011-08-15 HISTORY — DX: Spondylosis without myelopathy or radiculopathy, lumbar region: M47.816

## 2011-08-15 HISTORY — DX: Cerebral aneurysm, nonruptured: I67.1

## 2011-08-15 HISTORY — DX: Dyspnea, unspecified: R06.00

## 2011-08-15 HISTORY — DX: Shortness of breath: R06.02

## 2011-08-15 HISTORY — DX: Other complications of anesthesia, initial encounter: T88.59XA

## 2011-08-15 HISTORY — DX: Adverse effect of unspecified anesthetic, initial encounter: T41.45XA

## 2011-08-15 HISTORY — DX: Sleep apnea, unspecified: G47.30

## 2011-08-15 HISTORY — DX: Personal history of other diseases of the digestive system: Z87.19

## 2011-08-15 HISTORY — DX: Personal history of other infectious and parasitic diseases: Z86.19

## 2011-08-15 HISTORY — DX: Inflammatory liver disease, unspecified: K75.9

## 2011-08-15 HISTORY — DX: Nonalcoholic steatohepatitis (NASH): K75.81

## 2011-08-15 HISTORY — DX: Migraine, unspecified, not intractable, without status migrainosus: G43.909

## 2011-08-15 HISTORY — DX: Other forms of dyspnea: R06.09

## 2011-08-15 LAB — CBC
Hemoglobin: 12.3 g/dL (ref 12.0–15.0)
MCH: 28.9 pg (ref 26.0–34.0)
MCV: 86.8 fL (ref 78.0–100.0)
Platelets: 187 10*3/uL (ref 150–400)
RBC: 4.25 MIL/uL (ref 3.87–5.11)
WBC: 5 10*3/uL (ref 4.0–10.5)

## 2011-08-15 LAB — DIFFERENTIAL
Eosinophils Absolute: 0 10*3/uL (ref 0.0–0.7)
Eosinophils Relative: 1 % (ref 0–5)
Lymphocytes Relative: 18 % (ref 12–46)
Lymphs Abs: 0.9 10*3/uL (ref 0.7–4.0)
Monocytes Relative: 7 % (ref 3–12)
Neutrophils Relative %: 75 % (ref 43–77)

## 2011-08-15 LAB — CARDIAC PANEL(CRET KIN+CKTOT+MB+TROPI)
CK, MB: 1.6 ng/mL (ref 0.3–4.0)
Relative Index: INVALID (ref 0.0–2.5)
Total CK: 37 U/L (ref 7–177)
Troponin I: <0.3 ng/mL (ref <0.30)

## 2011-08-15 LAB — BASIC METABOLIC PANEL
BUN: 13 mg/dL (ref 6–23)
CO2: 27 mEq/L (ref 19–32)
GFR calc non Af Amer: 81 mL/min — ABNORMAL LOW (ref >90)
Glucose, Bld: 129 mg/dL — ABNORMAL HIGH (ref 70–99)
Potassium: 3.8 mEq/L (ref 3.5–5.1)
Sodium: 135 mEq/L (ref 135–145)

## 2011-08-15 LAB — URINALYSIS, ROUTINE W REFLEX MICROSCOPIC
Bilirubin Urine: NEGATIVE
Glucose, UA: NEGATIVE mg/dL
Hgb urine dipstick: NEGATIVE
Specific Gravity, Urine: 1.009 (ref 1.005–1.030)

## 2011-08-15 LAB — HEPATIC FUNCTION PANEL
ALT: 9 U/L (ref 0–35)
AST: 16 U/L (ref 0–37)
Total Protein: 5.8 g/dL — ABNORMAL LOW (ref 6.0–8.3)

## 2011-08-15 LAB — AMMONIA: Ammonia: 15 umol/L (ref 11–60)

## 2011-08-15 MED ORDER — AMLODIPINE BESYLATE 5 MG PO TABS
5.0000 mg | ORAL_TABLET | Freq: Every day | ORAL | Status: DC
  Administered 2011-08-16: 5 mg via ORAL
  Filled 2011-08-15: qty 1

## 2011-08-15 MED ORDER — FAMOTIDINE 20 MG PO TABS
20.0000 mg | ORAL_TABLET | Freq: Two times a day (BID) | ORAL | Status: DC
  Administered 2011-08-15 – 2011-08-19 (×8): 20 mg via ORAL
  Filled 2011-08-15 (×10): qty 1

## 2011-08-15 MED ORDER — PRAZOSIN HCL 2 MG PO CAPS
2.0000 mg | ORAL_CAPSULE | Freq: Two times a day (BID) | ORAL | Status: DC
  Filled 2011-08-15: qty 1

## 2011-08-15 MED ORDER — DOCUSATE SODIUM 100 MG PO CAPS
100.0000 mg | ORAL_CAPSULE | Freq: Two times a day (BID) | ORAL | Status: DC
  Administered 2011-08-15 – 2011-08-19 (×7): 100 mg via ORAL
  Filled 2011-08-15 (×7): qty 1

## 2011-08-15 MED ORDER — ONDANSETRON HCL 4 MG/2ML IJ SOLN
4.0000 mg | Freq: Four times a day (QID) | INTRAMUSCULAR | Status: DC | PRN
  Administered 2011-08-16: 4 mg via INTRAVENOUS
  Filled 2011-08-15: qty 2

## 2011-08-15 MED ORDER — HYDRALAZINE HCL 100 MG PO TABS: 100.0000 mg | ORAL_TABLET | Freq: Three times a day (TID) | ORAL | Status: DC

## 2011-08-15 MED ORDER — IBUPROFEN 200 MG PO TABS
200.0000 mg | ORAL_TABLET | Freq: Three times a day (TID) | ORAL | Status: DC | PRN
  Filled 2011-08-15: qty 1

## 2011-08-15 MED ORDER — POLYETHYLENE GLYCOL 3350 17 G PO PACK
17.0000 g | PACK | Freq: Every day | ORAL | Status: DC | PRN
  Administered 2011-08-17: 17 g via ORAL
  Filled 2011-08-15 (×2): qty 1

## 2011-08-15 MED ORDER — HYDRALAZINE HCL 50 MG PO TABS
100.0000 mg | ORAL_TABLET | Freq: Three times a day (TID) | ORAL | Status: DC
  Filled 2011-08-15 (×2): qty 2

## 2011-08-15 MED ORDER — SODIUM CHLORIDE 0.9 % IJ SOLN
3.0000 mL | Freq: Two times a day (BID) | INTRAMUSCULAR | Status: DC
  Administered 2011-08-15 – 2011-08-19 (×8): 3 mL via INTRAVENOUS

## 2011-08-15 MED ORDER — ONDANSETRON HCL 4 MG PO TABS
4.0000 mg | ORAL_TABLET | Freq: Four times a day (QID) | ORAL | Status: DC | PRN
  Administered 2011-08-16: 4 mg via ORAL
  Filled 2011-08-15: qty 1

## 2011-08-15 MED ORDER — PROPRANOLOL HCL ER 160 MG PO CP24
160.0000 mg | ORAL_CAPSULE | Freq: Every day | ORAL | Status: DC
  Administered 2011-08-16: 160 mg via ORAL
  Filled 2011-08-15 (×2): qty 1

## 2011-08-15 MED ORDER — ADULT MULTIVITAMIN W/MINERALS CH
1.0000 | ORAL_TABLET | Freq: Every day | ORAL | Status: DC
  Administered 2011-08-16 – 2011-08-19 (×4): 1 via ORAL
  Filled 2011-08-15 (×4): qty 1

## 2011-08-15 MED ORDER — ASPIRIN 81 MG PO CHEW
81.0000 mg | CHEWABLE_TABLET | Freq: Every day | ORAL | Status: DC
  Administered 2011-08-16 – 2011-08-19 (×4): 81 mg via ORAL
  Filled 2011-08-15 (×5): qty 1

## 2011-08-15 MED ORDER — ALBUTEROL SULFATE (5 MG/ML) 0.5% IN NEBU: 2.5000 mg | INHALATION_SOLUTION | Freq: Four times a day (QID) | RESPIRATORY_TRACT | Status: DC | PRN

## 2011-08-15 MED ORDER — HYDRALAZINE HCL 50 MG PO TABS
100.0000 mg | ORAL_TABLET | Freq: Three times a day (TID) | ORAL | Status: DC
  Administered 2011-08-15 – 2011-08-16 (×3): 100 mg via ORAL
  Filled 2011-08-15 (×8): qty 2

## 2011-08-15 MED ORDER — PRAZOSIN HCL 2 MG PO CAPS
2.0000 mg | ORAL_CAPSULE | Freq: Two times a day (BID) | ORAL | Status: DC
  Administered 2011-08-16 (×2): 2 mg via ORAL
  Filled 2011-08-15 (×5): qty 1

## 2011-08-15 MED ORDER — SODIUM CHLORIDE 0.9 % IV SOLN: 250.0000 mL | INTRAVENOUS | Status: DC | PRN

## 2011-08-15 MED ORDER — LISINOPRIL 20 MG PO TABS
20.0000 mg | ORAL_TABLET | Freq: Every day | ORAL | Status: DC
  Administered 2011-08-16: 20 mg via ORAL
  Filled 2011-08-15: qty 1

## 2011-08-15 MED ORDER — GABAPENTIN 300 MG PO CAPS
300.0000 mg | ORAL_CAPSULE | Freq: Two times a day (BID) | ORAL | Status: DC
  Administered 2011-08-15 – 2011-08-19 (×8): 300 mg via ORAL
  Filled 2011-08-15 (×10): qty 1

## 2011-08-15 MED ORDER — SODIUM CHLORIDE 0.9 % IJ SOLN: 3.0000 mL | INTRAMUSCULAR | Status: DC | PRN

## 2011-08-15 MED ORDER — CLONIDINE HCL 0.1 MG PO TABS
0.1000 mg | ORAL_TABLET | Freq: Four times a day (QID) | ORAL | Status: DC | PRN
  Filled 2011-08-15: qty 1

## 2011-08-15 MED ORDER — TRAMADOL HCL 50 MG PO TABS
50.0000 mg | ORAL_TABLET | Freq: Four times a day (QID) | ORAL | Status: DC | PRN
  Administered 2011-08-16 – 2011-08-18 (×3): 50 mg via ORAL
  Filled 2011-08-15 (×3): qty 1

## 2011-08-15 NOTE — ED Notes (Signed)
Dr. Cleotis Lema is at the bedside

## 2011-08-15 NOTE — H&P (Addendum)
PCP:  Terald Sleeper, MD, MD   DOA:  08/15/2011 11:52 AM  Chief Complaint:  Altered mental status  HPI: Patient is confused, history provided by granddaughter at bedside. 78 years old woman, nursing home resident with history of malignant hypertension was brought into the ED today when she had an episode of unresponsiveness this morning and lasted for less than an hour , EMS was called and was found to have blood pressure of 190/120, she was given 2 nitroglycerin tablets and became more awake and alert. The condition associated with mild headache, blurring of vision, one episode of vomiting and generalized weakness. As per  granddaughter she noted that patient had shivering episodes while in the ER however no classic tonic-clonic seizures. No tongue biting, bowel or bladder incontinence. She also denied any chest pain or shortness of breath. Patient was hospitalized in January for similar presentation, MRI brain at that time showed MCA aneurysm, she was evaluated by neurology and felt to be not contributing to her symptoms. Apparently as per granddaughter patient continued to have similar episodes almost once every month since discharge back in January and her blood pressure was difficult to control while in the nursing home. In the ED CT head showed no acute findings, neurology service was consulted and we were asked to admit for further management  Allergies: Allergies  Allergen Reactions  . Acetaminophen Other (See Comments)    No reaction stated on patient MAR  . Penicillins Other (See Comments)    No reaction stated on patient MAR  . Vicodin (Hydrocodone-Acetaminophen) Other (See Comments)    No reaction stated on MAR    Prior to Admission medications   Medication Sig Start Date End Date Taking? Authorizing Provider  amLODipine (NORVASC) 5 MG tablet Take 5 mg by mouth daily.   Yes Historical Provider, MD  aspirin 81 MG chewable tablet Chew 81 mg by mouth daily.   Yes Historical  Provider, MD  cloNIDine (CATAPRES) 0.1 MG tablet Take 0.1 mg by mouth every 6 (six) hours as needed. For systolic BP greater than or equal to 170   Yes Historical Provider, MD  docusate sodium (COLACE) 100 MG capsule Take 100 mg by mouth 2 (two) times daily.   Yes Historical Provider, MD  famotidine (PEPCID) 20 MG tablet Take 20 mg by mouth 2 (two) times daily.   Yes Historical Provider, MD  FeFum-FePo-FA-B Cmp-C-Zn-Mn-Cu (TANDEM PLUS) 162-115.2-1 MG CAPS Take 1 capsule by mouth daily.   Yes Historical Provider, MD  gabapentin (NEURONTIN) 300 MG capsule Take 300 mg by mouth at bedtime. Takes along with 400mg  to =700mg    Yes Historical Provider, MD  gabapentin (NEURONTIN) 400 MG capsule Take 400 mg by mouth at bedtime. Take with 300 mg cap to equal 700 mg   Yes Historical Provider, MD  hydrALAZINE (APRESOLINE) 100 MG tablet Take 100 mg by mouth 3 (three) times daily.   Yes Historical Provider, MD  lisinopril (PRINIVIL,ZESTRIL) 20 MG tablet Take 20 mg by mouth daily.   Yes Historical Provider, MD  Multiple Vitamin (MULITIVITAMIN WITH MINERALS) TABS Take 1 tablet by mouth daily.   Yes Historical Provider, MD  prazosin (MINIPRESS) 2 MG capsule Take 2 mg by mouth 2 (two) times daily.    Yes Historical Provider, MD  propranolol (INDERAL LA) 160 MG SR capsule Take 160 mg by mouth daily.   Yes Historical Provider, MD  traMADol (ULTRAM) 50 MG tablet Take 50 mg by mouth every 6 (six) hours as needed. For pain  Yes Historical Provider, MD  ibuprofen (ADVIL,MOTRIN) 200 MG tablet Take 200 mg by mouth 3 (three) times daily as needed. For pain     Historical Provider, MD    Past Medical History  Diagnosis Date  . Hypertension   . Chronic back pain   . Obstructive chronic bronchitis   . Muscle weakness   . Cirrhosis   . Cerebral aneurysm   . Hypertensive encephalopathy     Past Surgical History  Procedure Date  . Abdominal hysterectomy     Social History: Nursing home resident, reports that she  has never smoked or drank alcohol. She reports that she does not use illicit drugs.  History reviewed. No pertinent family history.  Review of Systems: As above in history of present illness, no other symptoms reported Constitutional: Denies fever, chills, diaphoresis, appetite change .  HEENT: Denies photophobia, eye pain, redness, loss, ear pain, congestion, sore throat, rhinorrhea, sneezing, mouth sores, trouble swallowing, neck pain, neck stiffness and tinnitus.   Respiratory: Denies SOB, DOE, cough, chest tightness,  and wheezing.   Cardiovascular: Denies chest pain, palpitations and leg swelling.  Gastrointestinal: Denies abdominal pain, diarrhea, constipation, blood in stool and abdominal distention.  Genitourinary: Denies dysuria, urgency, frequency, hematuria, flank pain and difficulty urinating.  Neurological: Complaining of Altered mental status, mild headache, blurring of vision.     Physical Exam:  Filed Vitals:   08/15/11 1347 08/15/11 1400 08/15/11 1430 08/15/11 1500  BP: 134/77 148/84 123/65 126/56  Pulse: 74 76 70 66  Temp: 97.2 F (36.2 C)     TempSrc: Oral     Resp: 13 15 12 13   SpO2: 100% 99% 98% 99%    Constitutional: Vital signs reviewed.  Patient is alert but lethargic in no acute distress  Head: Normocephalic and atraumatic Ear: Hard of hearing left ear Eyes: P conjunctivae normal, No scleral icterus.  Neck: Supple, Trachea midline normal ROM, No JVD, mass, thyromegaly, or carotid bruit present.  Cardiovascular: RRR, S1 normal, S2 normal, no MRG, pulses symmetric and intact bilaterally Pulmonary/Chest: CTAB, no wheezes, rales, or rhonchi Abdominal: Soft. Non-tender, non-distended, bowel sounds are normal, no masses, organomegaly, or guarding present.  Ext: no edema and no cyanosis, pulses palpable bilaterally   Neurological: A&O x1, lethargic, generally weak but no focal neurological deficit appreciated. Sensation intact Strenght .   Labs on Admission:    Results for orders placed during the hospital encounter of 08/15/11 (from the past 48 hour(s))  URINALYSIS, ROUTINE W REFLEX MICROSCOPIC     Status: Normal   Collection Time   08/15/11 12:25 PM      Component Value Range Comment   Color, Urine YELLOW  YELLOW     APPearance CLEAR  CLEAR     Specific Gravity, Urine 1.009  1.005 - 1.030     pH 8.0  5.0 - 8.0     Glucose, UA NEGATIVE  NEGATIVE (mg/dL)    Hgb urine dipstick NEGATIVE  NEGATIVE     Bilirubin Urine NEGATIVE  NEGATIVE     Ketones, ur NEGATIVE  NEGATIVE (mg/dL)    Protein, ur NEGATIVE  NEGATIVE (mg/dL)    Urobilinogen, UA 0.2  0.0 - 1.0 (mg/dL)    Nitrite NEGATIVE  NEGATIVE     Leukocytes, UA NEGATIVE  NEGATIVE  MICROSCOPIC NOT DONE ON URINES WITH NEGATIVE PROTEIN, BLOOD, LEUKOCYTES, NITRITE, OR GLUCOSE <1000 mg/dL.  CBC     Status: Normal   Collection Time   08/15/11 12:27 PM  Component Value Range Comment   WBC 5.0  4.0 - 10.5 (K/uL)    RBC 4.25  3.87 - 5.11 (MIL/uL)    Hemoglobin 12.3  12.0 - 15.0 (g/dL)    HCT 95.2  84.1 - 32.4 (%)    MCV 86.8  78.0 - 100.0 (fL)    MCH 28.9  26.0 - 34.0 (pg)    MCHC 33.3  30.0 - 36.0 (g/dL)    RDW 40.1  02.7 - 25.3 (%)    Platelets 187  150 - 400 (K/uL)   DIFFERENTIAL     Status: Normal   Collection Time   08/15/11 12:27 PM      Component Value Range Comment   Neutrophils Relative 75  43 - 77 (%)    Neutro Abs 3.7  1.7 - 7.7 (K/uL)    Lymphocytes Relative 18  12 - 46 (%)    Lymphs Abs 0.9  0.7 - 4.0 (K/uL)    Monocytes Relative 7  3 - 12 (%)    Monocytes Absolute 0.3  0.1 - 1.0 (K/uL)    Eosinophils Relative 1  0 - 5 (%)    Eosinophils Absolute 0.0  0.0 - 0.7 (K/uL)    Basophils Relative 0  0 - 1 (%)    Basophils Absolute 0.0  0.0 - 0.1 (K/uL)   BASIC METABOLIC PANEL     Status: Abnormal   Collection Time   08/15/11 12:27 PM      Component Value Range Comment   Sodium 135  135 - 145 (mEq/L)    Potassium 3.8  3.5 - 5.1 (mEq/L)    Chloride 97  96 - 112 (mEq/L)    CO2 27  19 -  32 (mEq/L)    Glucose, Bld 129 (*) 70 - 99 (mg/dL)    BUN 13  6 - 23 (mg/dL)    Creatinine, Ser 6.64  0.50 - 1.10 (mg/dL)    Calcium 8.7  8.4 - 10.5 (mg/dL)    GFR calc non Af Amer 81 (*) >90 (mL/min)    GFR calc Af Amer >90  >90 (mL/min)   CARDIAC PANEL(CRET KIN+CKTOT+MB+TROPI)     Status: Normal   Collection Time   08/15/11 12:27 PM      Component Value Range Comment   Total CK 37  7 - 177 (U/L)    CK, MB 1.6  0.3 - 4.0 (ng/mL)    Troponin I <0.30  <0.30 (ng/mL)    Relative Index RELATIVE INDEX IS INVALID  0.0 - 2.5      Radiological Exams on Admission: Dg Chest 2 View  08/15/2011  *RADIOLOGY REPORT*  Clinical Data: Unresponsive.  CHEST - 2 VIEW  Comparison: Portable chest 04/23/2011.  Findings: The heart size is normal.  Atherosclerotic calcifications are again noted in the aorta.  Chronic interstitial coarsening is evident.  There is chronic elevation of the right hemidiaphragm. Blunting of the costophrenic angles is again noted. Biapical pleural thickening is stable.  IMPRESSION:  1. 1.  No acute cardiopulmonary disease or significant interval change. 2.  Atherosclerosis. 3.  Stable chronic interstitial coarsening.  Original Report Authenticated By: Jamesetta Orleans. MATTERN, M.D.   Ct Head Wo Contrast  08/15/2011  *RADIOLOGY REPORT*  Clinical Data: Dizziness.  CT HEAD WITHOUT CONTRAST  Technique:  Contiguous axial images were obtained from the base of the skull through the vertex without contrast.  Comparison: Brain MRI head CT 04/21/2011.  Findings: No evidence of acute abnormality including infarction, hemorrhage, mass  lesion, mass effect, midline shift or abnormal extra-axial fluid collection.  There is mild cortical atrophy. Left MCA aneurysm seen on the comparison MRI is not visible on this examination.  Chronic opacification of the left sphenoid sinus noted.  IMPRESSION: No acute finding.  Stable compared to prior exams.  Original Report Authenticated By: Bernadene Bell. D'ALESSIO, M.D.   .  Assessment/Plan Active Problems:   Altered mental status/ Hypertensive encephalopathy Presentation suggestive of hypertensive encephalopathy however underlying seizure activity cannot be ruled out Mental status Improved, blood pressure dropped to 126/56 which is below the goal, her blood pressure this morning was 190/120. It should be lowered slowly and not more than 25% below  original value over 4-6 hours to avoid ischemic event.  continue with outpatient medications and hold for systolic blood pressure less than 150 for the first 24 hour . Then gradually adjust medications tomorrow. Patient had normal EEG last admission, neurology service was consulted, she'll possibly need prolonged EEG to capture any subtle seizure activity, will defer  to neurology. We'll admit to telemetry, seizures precaution and neuro checks UA, chest x-ray negative for infectious etiology, normal WBCs and patient is afebrile Given history of liver disease we'll check ammonia level and LFTs. We'll decrease gabapentin dose from 700 mg each bedtime to 300 mg twice a day and can gradually decrease as tolerated History of MCA aneurysm: Will defer the need to repeat MRI brain to neurology Generalized weakness:  Patient ambulates with a walker at the nursing home. Would obtain PT consult and check TSH  Hypertension, accelerated See above  COPD (chronic obstructive pulmonary disease)  stable, nebs when necessary History of liver  Cirrhosis Check LFTs, ammonia level CODE STATUS: Full code  DVT prophylaxis: SCDs   Time Spent on Admission:  approximately 50 minutes Dayvian Blixt 08/15/2011, 4:00 PM

## 2011-08-15 NOTE — ED Notes (Signed)
Pt is from [facility], altered mental status and hypertension since 0715, vomiting x 1. Pt was unresponsive on EMS arrival , initial 190/120. 2 nitros given en route, pt became more alert after nitros. 20g(L)AC. Pt placed on oxygen en route, staff reports pt is ambulatory and is alert and oriented.

## 2011-08-15 NOTE — ED Notes (Signed)
Pt has returned from out of the department; pt being placed back on monitor, continuous pulse oximetry, blood pressure cuff and oxygen [place] (2L); family at bedside

## 2011-08-15 NOTE — ED Notes (Signed)
Pt placed back on oxygen(4lpm), continuous pulse ox, blood pressure cuff

## 2011-08-15 NOTE — ED Notes (Signed)
Pt undressed, in gown, on monitor, continuous pulse oximetry, blood pressure cuff and oxygen  (2L); EKG performed 

## 2011-08-15 NOTE — ED Notes (Signed)
[id number] Ready

## 2011-08-15 NOTE — ED Notes (Signed)
Assisted pt in giving some water

## 2011-08-15 NOTE — Consult Note (Addendum)
Reason for Consult:Episode of unresponsiveness Referring Physician: Cleotis Lema  CC: Unresponsiveness  HPI: [name] is an 78 y.o. female who was brought into the ED today when she had an episode of unresponsiveness this morning that lasted for less than an hour , EMS was called and was found to have blood pressure of 190/120, she was given 2 nitroglycerin tablets and became more awake and alert. The condition was associated with mild headache, blurring of vision, one episode of vomiting and generalized weakness.  No tonic-clonic activity was noted.  Has been having these episodes at least monthly since discharge from the hospital in January when she was found to have a MCA aneurysm that was not felt to be contributing to her symptoms.  Patient is amnestic of these episodes and is unable to give any information about them.  Family is not in the room and history is gleaned form admission history and physical.      Past Medical History  Diagnosis Date  . Chronic back pain   . Obstructive chronic bronchitis   . Muscle weakness   . Cirrhosis   . Cerebral aneurysm 07/2011    "non ruptured; left side; behind optical nerve"  . H/O hiatal hernia   . Complication of anesthesia     "don't like medicine or to be put to sleep"  . HOH (hard of hearing), right   . Deaf, left   . Hypertension   . Hypertensive encephalopathy   . Pacemaker     'as a child"  . DVT (deep venous thrombosis), right 1970's    "not long after knee OR"  . Emphysema   . Sleep apnea     "doesn't wear mask"  . Exertional dyspnea   . Shortness of breath 08/15/11    "& when lying down"  . GERD (gastroesophageal reflux disease)   . Hepatitis   . Steatohepatitis, non-alcoholic   . Headache   . Migraines   . Scoliosis   . Osteoarthritis of lumbar spine   . History of shingles 05/2010    Past Surgical History  Procedure Date  . Vaginal hysterectomy 1980's  . Knee arthroscopy ?1970's    right  . Tympanoplasty 1950's     removed; left  . Myringotomy 08/2011    right  . Dilation and curettage of uterus 1970's    History reviewed. No pertinent family history.  Social History:  reports that she has never smoked. She has never used smokeless tobacco. She reports that she does not drink alcohol or use illicit drugs.  Allergies  Allergen Reactions  . Acetaminophen Nausea And Vomiting  . Penicillins Anaphylaxis  . Vicodin (Hydrocodone-Acetaminophen) Nausea And Vomiting    Medications:  I have reviewed the patient's current medications. Prior to Admission:  Prescriptions prior to admission  Medication Sig Dispense Refill  . amLODipine (NORVASC) 5 MG tablet Take 5 mg by mouth daily.      Marland Kitchen aspirin 81 MG chewable tablet Chew 81 mg by mouth daily.      . cloNIDine (CATAPRES) 0.1 MG tablet Take 0.1 mg by mouth every 6 (six) hours as needed. For systolic BP greater than or equal to 170      . docusate sodium (COLACE) 100 MG capsule Take 100 mg by mouth 2 (two) times daily.      . famotidine (PEPCID) 20 MG tablet Take 20 mg by mouth 2 (two) times daily.      Marland Kitchen FeFum-FePo-FA-B Cmp-C-Zn-Mn-Cu (TANDEM PLUS) 162-115.2-1 MG CAPS  Take 1 capsule by mouth daily.      Marland Kitchen gabapentin (NEURONTIN) 300 MG capsule Take 300 mg by mouth at bedtime. Takes along with 400mg  to =700mg       . gabapentin (NEURONTIN) 400 MG capsule Take 400 mg by mouth at bedtime. Take with 300 mg cap to equal 700 mg      . hydrALAZINE (APRESOLINE) 100 MG tablet Take 100 mg by mouth 3 (three) times daily.      Marland Kitchen lisinopril (PRINIVIL,ZESTRIL) 20 MG tablet Take 20 mg by mouth daily.      . Multiple Vitamin (MULITIVITAMIN WITH MINERALS) TABS Take 1 tablet by mouth daily.      . prazosin (MINIPRESS) 2 MG capsule Take 2 mg by mouth 2 (two) times daily.       . propranolol (INDERAL LA) 160 MG SR capsule Take 160 mg by mouth daily.      . traMADol (ULTRAM) 50 MG tablet Take 50 mg by mouth every 6 (six) hours as needed. For pain      . ibuprofen (ADVIL,MOTRIN)  200 MG tablet Take 200 mg by mouth 3 (three) times daily as needed. For pain        Scheduled:   . amLODipine  5 mg Oral Daily  . aspirin  81 mg Oral Daily  . docusate sodium  100 mg Oral BID  . famotidine  20 mg Oral BID  . gabapentin  300 mg Oral BID  . hydrALAZINE  100 mg Oral TID  . lisinopril  20 mg Oral Daily  . mulitivitamin with minerals  1 tablet Oral Daily  . prazosin  2 mg Oral BID  . propranolol ER  160 mg Oral Daily  . sodium chloride  3 mL Intravenous Q12H  . DISCONTD: hydrALAZINE  100 mg Oral TID  . DISCONTD: hydrALAZINE  100 mg Oral TID  . DISCONTD: prazosin  2 mg Oral BID    ROS: History obtained from the patient  General ROS: weakness Psychological ROS: negative for - behavioral disorder, hallucinations, memory difficulties, mood swings or suicidal ideation Ophthalmic ROS: negative for - blurry vision, double vision, eye pain or loss of vision ENT ROS: dizziness, decreased hearing, absent left ear drum Allergy and Immunology ROS: negative for - hives or itchy/watery eyes Hematological and Lymphatic ROS: negative for - bleeding problems, bruising or swollen lymph nodes Endocrine ROS: negative for - galactorrhea, hair pattern changes, polydipsia/polyuria or temperature intolerance Respiratory ROS: negative for - cough, hemoptysis, shortness of breath or wheezing Cardiovascular ROS: negative for - chest pain, dyspnea on exertion, edema or irregular heartbeat Gastrointestinal ROS: negative for - abdominal pain, diarrhea, hematemesis, nausea/vomiting or stool incontinence Genito-Urinary ROS: negative for - dysuria, hematuria, incontinence or urinary frequency/urgency Musculoskeletal ROS: negative for - joint swelling or muscular weakness Neurological ROS: as noted in HPI Dermatological ROS: negative for rash and skin lesion changes  Physical Examination: Blood pressure 97/60, pulse 72, temperature 98.4 F (36.9 C), temperature source Oral, resp. rate 20, height  5\' 5"  (1.651 m), weight 58.968 kg (130 lb), SpO2 97.00%.  Neurologic Examination Mental Status: Alert and awake.  Speech fluent without evidence of aphasia.  Dysarthria noted.  Able to follow 3 step commands without difficulty. Cranial Nerves: II: visual fields grossly normal, pupils equal, round, reactive to light and accommodation III,IV, VI: right ptosis, extra-ocular motions intact bilaterally V,VII: smile symmetric, facial light touch sensation normal bilaterally VIII: hearing decreased bilaterally IX,X: gag reflex present XI: trapezius strength/neck flexion strength normal  bilaterally XII: tongue strength normal  Motor: Generalized weakness noted.  No focality.  Able to lift all extremities against gravity.   Tone and bulk:normal tone throughout; no atrophy noted Sensory: Pinprick and light touch intact throughout, bilaterally Deep Tendon Reflexes: 2+ in the upper extremities and absent in the lower extremities Plantars: Right: downgoing   Left: downgoing Cerebellar: normal finger-to-nose and normal heel-to-shin test   Results for orders placed during the hospital encounter of 08/15/11 (from the past 48 hour(s))  URINALYSIS, ROUTINE W REFLEX MICROSCOPIC     Status: Normal   Collection Time   08/15/11 12:25 PM      Component Value Range Comment   Color, Urine YELLOW  YELLOW     APPearance CLEAR  CLEAR     Specific Gravity, Urine 1.009  1.005 - 1.030     pH 8.0  5.0 - 8.0     Glucose, UA NEGATIVE  NEGATIVE (mg/dL)    Hgb urine dipstick NEGATIVE  NEGATIVE     Bilirubin Urine NEGATIVE  NEGATIVE     Ketones, ur NEGATIVE  NEGATIVE (mg/dL)    Protein, ur NEGATIVE  NEGATIVE (mg/dL)    Urobilinogen, UA 0.2  0.0 - 1.0 (mg/dL)    Nitrite NEGATIVE  NEGATIVE     Leukocytes, UA NEGATIVE  NEGATIVE  MICROSCOPIC NOT DONE ON URINES WITH NEGATIVE PROTEIN, BLOOD, LEUKOCYTES, NITRITE, OR GLUCOSE <1000 mg/dL.  CBC     Status: Normal   Collection Time   08/15/11 12:27 PM      Component Value  Range Comment   WBC 5.0  4.0 - 10.5 (K/uL)    RBC 4.25  3.87 - 5.11 (MIL/uL)    Hemoglobin 12.3  12.0 - 15.0 (g/dL)    HCT 84.6  96.2 - 95.2 (%)    MCV 86.8  78.0 - 100.0 (fL)    MCH 28.9  26.0 - 34.0 (pg)    MCHC 33.3  30.0 - 36.0 (g/dL)    RDW 84.1  32.4 - 40.1 (%)    Platelets 187  150 - 400 (K/uL)   DIFFERENTIAL     Status: Normal   Collection Time   08/15/11 12:27 PM      Component Value Range Comment   Neutrophils Relative 75  43 - 77 (%)    Neutro Abs 3.7  1.7 - 7.7 (K/uL)    Lymphocytes Relative 18  12 - 46 (%)    Lymphs Abs 0.9  0.7 - 4.0 (K/uL)    Monocytes Relative 7  3 - 12 (%)    Monocytes Absolute 0.3  0.1 - 1.0 (K/uL)    Eosinophils Relative 1  0 - 5 (%)    Eosinophils Absolute 0.0  0.0 - 0.7 (K/uL)    Basophils Relative 0  0 - 1 (%)    Basophils Absolute 0.0  0.0 - 0.1 (K/uL)   BASIC METABOLIC PANEL     Status: Abnormal   Collection Time   08/15/11 12:27 PM      Component Value Range Comment   Sodium 135  135 - 145 (mEq/L)    Potassium 3.8  3.5 - 5.1 (mEq/L)    Chloride 97  96 - 112 (mEq/L)    CO2 27  19 - 32 (mEq/L)    Glucose, Bld 129 (*) 70 - 99 (mg/dL)    BUN 13  6 - 23 (mg/dL)    Creatinine, Ser 0.27  0.50 - 1.10 (mg/dL)    Calcium 8.7  8.4 -  10.5 (mg/dL)    GFR calc non Af Amer 81 (*) >90 (mL/min)    GFR calc Af Amer >90  >90 (mL/min)   CARDIAC PANEL(CRET KIN+CKTOT+MB+TROPI)     Status: Normal   Collection Time   08/15/11 12:27 PM      Component Value Range Comment   Total CK 37  7 - 177 (U/L)    CK, MB 1.6  0.3 - 4.0 (ng/mL)    Troponin I <0.30  <0.30 (ng/mL)    Relative Index RELATIVE INDEX IS INVALID  0.0 - 2.5    AMMONIA     Status: Normal   Collection Time   08/15/11  8:53 PM      Component Value Range Comment   Ammonia 15  11 - 60 (umol/L)   HEPATIC FUNCTION PANEL     Status: Abnormal   Collection Time   08/15/11  8:53 PM      Component Value Range Comment   Total Protein 5.8 (*) 6.0 - 8.3 (g/dL)    Albumin 3.1 (*) 3.5 - 5.2 (g/dL)    AST 16   0 - 37 (U/L)    ALT 9  0 - 35 (U/L)    Alkaline Phosphatase 66  39 - 117 (U/L)    Total Bilirubin 0.5  0.3 - 1.2 (mg/dL)    Bilirubin, Direct 0.1  0.0 - 0.3 (mg/dL)    Indirect Bilirubin 0.4  0.3 - 0.9 (mg/dL)     No results found for this or any previous visit (from the past 240 hour(s)).  Dg Chest 2 View  08/15/2011  *RADIOLOGY REPORT*  Clinical Data: Unresponsive.  CHEST - 2 VIEW  Comparison: Portable chest 04/23/2011.  Findings: The heart size is normal.  Atherosclerotic calcifications are again noted in the aorta.  Chronic interstitial coarsening is evident.  There is chronic elevation of the right hemidiaphragm. Blunting of the costophrenic angles is again noted. Biapical pleural thickening is stable.  IMPRESSION:  1. 1.  No acute cardiopulmonary disease or significant interval change. 2.  Atherosclerosis. 3.  Stable chronic interstitial coarsening.  Original Report Authenticated By: Jamesetta Orleans. MATTERN, M.D.   Ct Head Wo Contrast  08/15/2011  *RADIOLOGY REPORT*  Clinical Data: Dizziness.  CT HEAD WITHOUT CONTRAST  Technique:  Contiguous axial images were obtained from the base of the skull through the vertex without contrast.  Comparison: Brain MRI head CT 04/21/2011.  Findings: No evidence of acute abnormality including infarction, hemorrhage, mass lesion, mass effect, midline shift or abnormal extra-axial fluid collection.  There is mild cortical atrophy. Left MCA aneurysm seen on the comparison MRI is not visible on this examination.  Chronic opacification of the left sphenoid sinus noted.  IMPRESSION: No acute finding.  Stable compared to prior exams.  Original Report Authenticated By: Bernadene Bell. Maricela Curet, M.D.     Assessment/Plan:  Patient Active Hospital Problem List: Altered mental status (04/21/2011)   Assessment: Unclear etiology but episodes have been recurrent and patient is amnestic of these vents.  Would rule out seizure.  With poorly controlled BP and concomitant smptoms  would consider PRES as well.  Will require further imaging.  Patient had imaging for aneurysm in January.       Plan:  1.  EEG  2.  MRI/MRA of the brain without contrast.  MRA to be compared to that done in January.    3.  BP control without being too aggressive to prevent hypoperfusion.    Thana Farr, MD [facility]  [phone] 08/15/2011, 10:53 PM

## 2011-08-15 NOTE — ED Provider Notes (Signed)
History     CSN: [id number]  Arrival date & time 08/15/11  1152   First MD Initiated Contact with Patient 08/15/11 1156      Chief Complaint  Patient presents with  . Hypertension  . Altered Mental Status    (Consider location/radiation/quality/duration/timing/severity/associated sxs/prior treatment) Patient is a 78 y.o. female presenting with hypertension and altered mental status. The history is provided by the patient and a relative.  Hypertension This is a recurrent problem. The current episode started less than 1 hour ago. The problem occurs daily. The problem has been rapidly improving. Pertinent negatives include no chest pain, no abdominal pain and no shortness of breath. Associated symptoms comments: Altered today and no responding.  Resolved after getting NTG by EMS. The symptoms are aggravated by nothing. The symptoms are relieved by nothing.  Altered Mental Status This is a recurrent problem. The current episode started less than 1 hour ago. The problem occurs constantly. The problem has been resolved. Pertinent negatives include no chest pain, no abdominal pain and no shortness of breath. The symptoms are aggravated by nothing. Relieved by: NTG. Treatments tried: NTG. The treatment provided significant relief.    Past Medical History  Diagnosis Date  . Hypertension   . Chronic back pain   . Obstructive chronic bronchitis   . Muscle weakness   . Cirrhosis   . Cerebral aneurysm   . Hypertensive encephalopathy     Past Surgical History  Procedure Date  . Abdominal hysterectomy     History reviewed. No pertinent family history.  History  Substance Use Topics  . Smoking status: Never Smoker   . Smokeless tobacco: Not on file  . Alcohol Use: No    OB History    Grav Para Term Preterm Abortions TAB SAB Ect Mult Living                  Review of Systems  Constitutional: Negative for fever and chills.  Respiratory: Negative for chest tightness and shortness  of breath.   Cardiovascular: Negative for chest pain.  Gastrointestinal: Negative for abdominal pain.  Genitourinary: Negative for dysuria.  Musculoskeletal: Negative for joint swelling.  Psychiatric/Behavioral: Positive for altered mental status.  All other systems reviewed and are negative.    Allergies  Acetaminophen; Penicillins; and Vicodin  Home Medications   Current Outpatient Rx  Name Route Sig Dispense Refill  . AMLODIPINE BESYLATE 5 MG PO TABS Oral Take 5 mg by mouth daily.    . ASPIRIN 81 MG PO CHEW Oral Chew 81 mg by mouth daily.    Marland Kitchen CLONIDINE HCL 0.1 MG PO TABS Oral Take 0.1 mg by mouth every 6 (six) hours as needed. For systolic BP greater than or equal to 170    . DOCUSATE SODIUM 100 MG PO CAPS Oral Take 100 mg by mouth 2 (two) times daily.    Marland Kitchen FAMOTIDINE 20 MG PO TABS Oral Take 20 mg by mouth 2 (two) times daily.    Marland Kitchen TANDEM PLUS 162-115.2-1 MG PO CAPS Oral Take 1 capsule by mouth daily.    Marland Kitchen GABAPENTIN 300 MG PO CAPS Oral Take 300 mg by mouth at bedtime. Takes along with 400mg  to =700mg     . GABAPENTIN 400 MG PO CAPS Oral Take 400 mg by mouth at bedtime. Take with 300 mg cap to equal 700 mg    . HYDRALAZINE HCL 100 MG PO TABS Oral Take 100 mg by mouth 3 (three) times daily.    Marland Kitchen  LISINOPRIL 20 MG PO TABS Oral Take 20 mg by mouth daily.    . ADULT MULTIVITAMIN W/MINERALS CH Oral Take 1 tablet by mouth daily.    Marland Kitchen PRAZOSIN HCL 2 MG PO CAPS Oral Take 2 mg by mouth 2 (two) times daily.     Marland Kitchen PROPRANOLOL HCL ER 160 MG PO CP24 Oral Take 160 mg by mouth daily.    . TRAMADOL HCL 50 MG PO TABS Oral Take 50 mg by mouth every 6 (six) hours as needed. For pain    . IBUPROFEN 200 MG PO TABS Oral Take 200 mg by mouth 3 (three) times daily as needed. For pain       BP 148/84  Pulse 76  Temp(Src) 97.2 F (36.2 C) (Oral)  Resp 15  SpO2 99%  Physical Exam  Nursing note and vitals reviewed. Constitutional: She is oriented to person, place, and time. She appears  well-developed and well-nourished. No distress.  HENT:  Head: Normocephalic and atraumatic.    Mouth/Throat: Mucous membranes are dry.       Drain placed in the right ear  Eyes: EOM are normal. Pupils are equal, round, and reactive to light.  Cardiovascular: Normal rate, regular rhythm, normal heart sounds and intact distal pulses.  Exam reveals no friction rub.   No murmur heard. Pulmonary/Chest: Effort normal and breath sounds normal. She has no wheezes. She has no rales.  Abdominal: Soft. Bowel sounds are normal. She exhibits no distension. There is no tenderness. There is no rebound and no guarding.  Musculoskeletal: Normal range of motion. She exhibits no tenderness.       No edema  Neurological: She is alert and oriented to person, place, and time. She has normal strength. No cranial nerve deficit or sensory deficit.  Skin: Skin is warm and dry. No rash noted.  Psychiatric: She has a normal mood and affect. Her behavior is normal.    ED Course  Procedures (including critical care time)  Labs Reviewed  BASIC METABOLIC PANEL - Abnormal; Notable for the following:    Glucose, Bld 129 (*)    GFR calc non Af Amer 81 (*)    All other components within normal limits  CBC  DIFFERENTIAL  URINALYSIS, ROUTINE W REFLEX MICROSCOPIC  CARDIAC PANEL(CRET KIN+CKTOT+MB+TROPI)   Ct Head Wo Contrast  08/15/2011  *RADIOLOGY REPORT*  Clinical Data: Dizziness.  CT HEAD WITHOUT CONTRAST  Technique:  Contiguous axial images were obtained from the base of the skull through the vertex without contrast.  Comparison: Brain MRI head CT 04/21/2011.  Findings: No evidence of acute abnormality including infarction, hemorrhage, mass lesion, mass effect, midline shift or abnormal extra-axial fluid collection.  There is mild cortical atrophy. Left MCA aneurysm seen on the comparison MRI is not visible on this examination.  Chronic opacification of the left sphenoid sinus noted.  IMPRESSION: No acute finding.   Stable compared to prior exams.  Original Report Authenticated By: Bernadene Bell. Maricela Curet, M.D.    Date: 08/15/2011  Rate: 78  Rhythm: normal sinus rhythm  QRS Axis: normal  Intervals: normal  ST/T Wave abnormalities: normal  Conduction Disutrbances: none  Narrative Interpretation: unremarkable      1. Seizures       MDM   Poor historian and difficult history however it sounds like today she was altered with hypertension that then resolved after NTG. Per family she's been having these episodes intermittently for the last 6 months. They state her eyes were glazed over and she  will forget what was going on and who she is and then resolve. Patient was found in January to have a brain aneurysm at that time they felt she was not having a TIA. They thought she may been having hypertensive encephalopathy causing her symptoms. However with speaking with the family more feel that she may be having abscence seizures causing her symptoms.  Take any medication or see and has been in her normal state of health. She denies any pain but did vomit once today. Family states that she's been vomiting in the morning for years and it is not different for her. She. Labs are within normal limits. EKG is within normal limits.  Feel pt needs further work up for possible sz vs hypertensive encephalopathy.        Gwyneth Sprout, MD 08/15/11 1527

## 2011-08-16 ENCOUNTER — Inpatient Hospital Stay (HOSPITAL_COMMUNITY): Payer: Medicare Other

## 2011-08-16 DIAGNOSIS — K7460 Unspecified cirrhosis of liver: Secondary | ICD-10-CM

## 2011-08-16 DIAGNOSIS — R5383 Other fatigue: Secondary | ICD-10-CM

## 2011-08-16 DIAGNOSIS — R404 Transient alteration of awareness: Secondary | ICD-10-CM

## 2011-08-16 DIAGNOSIS — R5381 Other malaise: Secondary | ICD-10-CM

## 2011-08-16 DIAGNOSIS — I10 Essential (primary) hypertension: Secondary | ICD-10-CM

## 2011-08-16 DIAGNOSIS — I674 Hypertensive encephalopathy: Secondary | ICD-10-CM

## 2011-08-16 DIAGNOSIS — R569 Unspecified convulsions: Secondary | ICD-10-CM

## 2011-08-16 LAB — CBC
Platelets: 217 10*3/uL (ref 150–400)
RBC: 4.08 MIL/uL (ref 3.87–5.11)
WBC: 6.4 10*3/uL (ref 4.0–10.5)

## 2011-08-16 LAB — COMPREHENSIVE METABOLIC PANEL
BUN: 13 mg/dL (ref 6–23)
CO2: 29 mEq/L (ref 19–32)
Chloride: 99 mEq/L (ref 96–112)
Creatinine, Ser: 0.73 mg/dL (ref 0.50–1.10)
GFR calc Af Amer: 89 mL/min — ABNORMAL LOW (ref >90)
GFR calc non Af Amer: 77 mL/min — ABNORMAL LOW (ref >90)
Total Bilirubin: 0.7 mg/dL (ref 0.3–1.2)

## 2011-08-16 LAB — TSH: TSH: 1.062 u[IU]/mL (ref 0.350–4.500)

## 2011-08-16 MED ORDER — AMLODIPINE BESYLATE 5 MG PO TABS
5.0000 mg | ORAL_TABLET | ORAL | Status: DC
  Filled 2011-08-16: qty 1

## 2011-08-16 MED ORDER — LABETALOL HCL 5 MG/ML IV SOLN: 20.0000 mg | INTRAVENOUS | Status: DC | PRN

## 2011-08-16 MED ORDER — LORAZEPAM 2 MG/ML IJ SOLN: 1.0000 mg | Freq: Once | INTRAMUSCULAR | Status: DC

## 2011-08-16 MED ORDER — LORAZEPAM 2 MG/ML IJ SOLN: 1.0000 mg | Freq: Once | INTRAMUSCULAR | Status: AC | PRN

## 2011-08-16 MED ORDER — AMLODIPINE BESYLATE 5 MG PO TABS
5.0000 mg | ORAL_TABLET | Freq: Every day | ORAL | Status: DC
  Filled 2011-08-16: qty 1

## 2011-08-16 MED ORDER — AMLODIPINE BESYLATE 10 MG PO TABS: 10.0000 mg | ORAL_TABLET | Freq: Every day | ORAL | Status: DC

## 2011-08-16 MED ORDER — AMLODIPINE BESYLATE 10 MG PO TABS
10.0000 mg | ORAL_TABLET | Freq: Every day | ORAL | Status: DC
  Filled 2011-08-16: qty 1

## 2011-08-16 MED ORDER — LISINOPRIL 40 MG PO TABS
40.0000 mg | ORAL_TABLET | Freq: Every day | ORAL | Status: DC
  Filled 2011-08-16: qty 1

## 2011-08-16 NOTE — Progress Notes (Addendum)
Patient ID: [name]  female  ZOX:[id number]    DOB: 09/04/1925    DOA: 08/15/2011  PCP: Terald Sleeper, MD, MD  Subjective: Somewhat confused and some dysarthria noted (this was discussed with RN, was reported to me that this is patient's baseline by her family)  Objective: Weight change:   Intake/Output Summary (Last 24 hours) at 08/16/11 1233 Last data filed at 08/16/11 0830  Gross per 24 hour  Intake    240 ml  Output      0 ml  Net    240 ml   Blood pressure 216/99, pulse 75, temperature 98.1 F (36.7 C), temperature source Oral, resp. rate 18, height 5\' 5"  (1.651 m), weight 58.968 kg (130 lb), SpO2 94.00%.  Physical Exam: General: Alert and awake,, not in any acute distress. HEENT: anicteric sclera, pupils reactive to light and accommodation, EOMI CVS: S1-S2 clear, no murmur rubs or gallops Chest: clear to auscultation bilaterally, no wheezing, rales or rhonchi Abdomen: soft nontender, nondistended, normal bowel sounds, no organomegaly Extremities: no cyanosis, clubbing or edema noted bilaterally   Lab Results: Basic Metabolic Panel:  Lab 08/16/11 8119 08/15/11 1227  NA 137 135  K 3.5 3.8  CL 99 97  CO2 29 27  GLUCOSE 91 129*  BUN 13 13  CREATININE 0.73 0.63  CALCIUM 8.7 8.7  MG -- --  PHOS -- --   Liver Function Tests:  Lab 08/16/11 0522 08/15/11 2053  AST 17 16  ALT 9 9  ALKPHOS 67 66  BILITOT 0.7 0.5  PROT 6.0 5.8*  ALBUMIN 3.2* 3.1*   No results found for this basename: LIPASE:2,AMYLASE:2 in the last 168 hours  Lab 08/15/11 2053  AMMONIA 15   CBC:  Lab 08/16/11 0522 08/15/11 1227  WBC 6.4 5.0  NEUTROABS -- 3.7  HGB 11.8* 12.3  HCT 35.6* 36.9  MCV 87.3 86.8  PLT 217 187   Cardiac Enzymes:  Lab 08/15/11 1227  CKTOTAL 37  CKMB 1.6  CKMBINDEX --  TROPONINI <0.30   BNP: No components found with this basename: POCBNP:2 CBG: No results found for this basename: GLUCAP:5 in the last 168 hours   Micro Results: No results  found for this or any previous visit (from the past 240 hour(s)).  Studies/Results: Dg Chest 2 View  08/15/2011  *RADIOLOGY REPORT*  Clinical Data: Unresponsive.  CHEST - 2 VIEW  Comparison: Portable chest 04/23/2011.  Findings: The heart size is normal.  Atherosclerotic calcifications are again noted in the aorta.  Chronic interstitial coarsening is evident.  There is chronic elevation of the right hemidiaphragm. Blunting of the costophrenic angles is again noted. Biapical pleural thickening is stable.  IMPRESSION:  1. 1.  No acute cardiopulmonary disease or significant interval change. 2.  Atherosclerosis. 3.  Stable chronic interstitial coarsening.  Original Report Authenticated By: Jamesetta Orleans. MATTERN, M.D.   Ct Head Wo Contrast  08/15/2011  *RADIOLOGY REPORT*  Clinical Data: Dizziness.  CT HEAD WITHOUT CONTRAST  Technique:  Contiguous axial images were obtained from the base of the skull through the vertex without contrast.  Comparison: Brain MRI head CT 04/21/2011.  Findings: No evidence of acute abnormality including infarction, hemorrhage, mass lesion, mass effect, midline shift or abnormal extra-axial fluid collection.  There is mild cortical atrophy. Left MCA aneurysm seen on the comparison MRI is not visible on this examination.  Chronic opacification of the left sphenoid sinus noted.  IMPRESSION: No acute finding.  Stable compared to prior exams.  Original Report  Authenticated By: Bernadene Bell. Maricela Curet, M.D.    Medications: Scheduled Meds:   . amLODipine  5 mg Oral Daily  . aspirin  81 mg Oral Daily  . docusate sodium  100 mg Oral BID  . famotidine  20 mg Oral BID  . gabapentin  300 mg Oral BID  . hydrALAZINE  100 mg Oral TID  . lisinopril  20 mg Oral Daily  . mulitivitamin with minerals  1 tablet Oral Daily  . prazosin  2 mg Oral BID  . propranolol ER  160 mg Oral Daily  . sodium chloride  3 mL Intravenous Q12H  . DISCONTD: hydrALAZINE  100 mg Oral TID  . DISCONTD: hydrALAZINE   100 mg Oral TID  . DISCONTD: prazosin  2 mg Oral BID   Continuous Infusions:    Assessment/Plan: Active Problems:  Altered mental status: Unclear etiology - Appreciate neurology service following, MRI/MRA pending, EEG pending - Agree with reducing gabapentin - Will order PT eval, ? vertigo   COPD (chronic obstructive pulmonary disease): Stable   Hypertension, accelerated, : BP still uncontrolled - Increase her Norvasc and Lisinopril, continue hydralazine, prazosin, propranolol   Cirrhosis stable   Hypertensive encephalopathy: Monitor closely  Generalized debility: Reviewed Child psychotherapist note, will place PT evaluation  DVT Prophylaxis: SCDs  Code Status:  Disposition: Will likely need skilled nursing facility   LOS: 1 day   Elisheba Mcdonnell M.D. [facility] 08/16/2011, 12:33 PM Pager: [phone]

## 2011-08-16 NOTE — Clinical Social Work Psychosocial (Signed)
     Clinical Social Work Department BRIEF PSYCHOSOCIAL ASSESSMENT 08/16/2011  Patient:  [name], [name]     Account Number:  [id number]     Admit date:  08/15/2011  Clinical Social Worker:  Peggyann Shoals  Date/Time:  08/16/2011 11:30 AM  Referred by:  Physician  Date Referred:  08/16/2011 Referred for  SNF Placement   Other Referral:   Interview type:  Family Other interview type:    PSYCHOSOCIAL DATA Living Status:  FACILITY Admitted from facility:  [facility] Level of care:  Assisted Living Primary support name:  Governor Specking Primary support relationship to patient:  FAMILY Degree of support available:   Missy is pt's granddaughter as well as her POA. Pt has supportive adult children.    CURRENT CONCERNS Current Concerns  Post-Acute Placement   Other Concerns:    SOCIAL WORK ASSESSMENT / PLAN CSW met with pt and family to address consult. Pt was admitted from the ALF portion of [facility]. Pt has been living there for a number of years. Pt is agreeable to returning when she is medically ready. Pt shared that she has been in the hospital before and understands that she will return to [facility]. CSW provided support to pt and family as pt has been admitted several time over the last few months.    CSW is awaiting PT/OT evals to determine the level of care needed at discharge. CSW will contact [facility] to follow up on pt's return. CSW will complete FL2 and send referral to [facility]. CSW will continue to follow to facilitate discharge to SNF when medically ready.   Assessment/plan status:  Psychosocial Support/Ongoing Assessment of Needs Other assessment/ plan:   Information/referral to community resources:   As needed.    PATIENTS/FAMILYS RESPONSE TO PLAN OF CARE: Pt was alert and oriented. Both pt and family were very pleasant. Pt and family are agreeable to discharge plan. Pt and family were thankful for CSW support.

## 2011-08-16 NOTE — Progress Notes (Signed)
[facility] PROGRESS NOTE    SUBJECTIVE    Very hard of hearing, no complaints.  OBJECTIVE   Vital signs in last 24 hours: Temp:  [97.2 F (36.2 C)-98.4 F (36.9 C)] 98.1 F (36.7 C) (05/10 0628) Pulse Rate:  [66-87] 67  (05/10 0628) Resp:  [12-20] 18  (05/10 0628) BP: (97-167)/(56-85) 162/64 mmHg (05/10 0628) SpO2:  [97 %-100 %] 99 % (05/10 0628) Weight:  [58.968 kg (130 lb)] 58.968 kg (130 lb) (05/09 1600)  Intake/Output from previous day:   Intake/Output this shift:   Nutritional status: Cardiac  Past Medical History  Diagnosis Date  . Chronic back pain   . Obstructive chronic bronchitis   . Muscle weakness   . Cirrhosis   . Cerebral aneurysm 07/2011    "non ruptured; left side; behind optical nerve"  . H/O hiatal hernia   . Complication of anesthesia     "don't like medicine or to be put to sleep"  . HOH (hard of hearing), right   . Deaf, left   . Hypertension   . Hypertensive encephalopathy   . Pacemaker     'as a child"  . DVT (deep venous thrombosis), right 1970's    "not long after knee OR"  . Emphysema   . Sleep apnea     "doesn't wear mask"  . Exertional dyspnea   . Shortness of breath 08/15/11    "& when lying down"  . GERD (gastroesophageal reflux disease)   . Hepatitis   . Steatohepatitis, non-alcoholic   . Headache   . Migraines   . Scoliosis   . Osteoarthritis of lumbar spine   . History of shingles 05/2010    Neurologic Exam:  Mental Status: Alert, oriented, thought content appropriate.  Cannot spell W-O-R-L-D backward. Not able to ad  Numbers together correctly.  Speech fluent without evidence of aphasia --very muffled due to hearing impairment. Able to follow 3 step commands without difficulty. Cranial Nerves: II-Visual fields grossly intact. III/IV/VI-Extraocular movements intact.  Pupils reactive bilaterally. V/VII-Smile symmetric VIII-Very hard of hearing IX/X-normal  gag XI-bilateral shoulder shrug XII-midline tongue extension Motor: 5/5 bilaterally with normal tone and bulk Sensory: Pinprick and light touch intact throughout, bilaterally Deep Tendon Reflexes: Depressed throughout symmetric throughout Plantars: Mute Cerebellar: Normal finger-to-nose,    Lab Results: Results for orders placed during the hospital encounter of 08/15/11 (from the past 24 hour(s))  URINALYSIS, ROUTINE W REFLEX MICROSCOPIC     Status: Normal   Collection Time   08/15/11 12:25 PM      Component Value Range   Color, Urine YELLOW  YELLOW    APPearance CLEAR  CLEAR    Specific Gravity, Urine 1.009  1.005 - 1.030    pH 8.0  5.0 - 8.0    Glucose, UA NEGATIVE  NEGATIVE (mg/dL)   Hgb urine dipstick NEGATIVE  NEGATIVE    Bilirubin Urine NEGATIVE  NEGATIVE    Ketones, ur NEGATIVE  NEGATIVE (mg/dL)   Protein, ur NEGATIVE  NEGATIVE (mg/dL)   Urobilinogen, UA 0.2  0.0 - 1.0 (mg/dL)   Nitrite NEGATIVE  NEGATIVE    Leukocytes, UA NEGATIVE  NEGATIVE   CBC     Status: Normal   Collection Time   08/15/11 12:27 PM  Component Value Range   WBC 5.0  4.0 - 10.5 (K/uL)   RBC 4.25  3.87 - 5.11 (MIL/uL)   Hemoglobin 12.3  12.0 - 15.0 (g/dL)   HCT 16.1  09.6 - 04.5 (%)   MCV 86.8  78.0 - 100.0 (fL)   MCH 28.9  26.0 - 34.0 (pg)   MCHC 33.3  30.0 - 36.0 (g/dL)   RDW 40.9  81.1 - 91.4 (%)   Platelets 187  150 - 400 (K/uL)  DIFFERENTIAL     Status: Normal   Collection Time   08/15/11 12:27 PM      Component Value Range   Neutrophils Relative 75  43 - 77 (%)   Neutro Abs 3.7  1.7 - 7.7 (K/uL)   Lymphocytes Relative 18  12 - 46 (%)   Lymphs Abs 0.9  0.7 - 4.0 (K/uL)   Monocytes Relative 7  3 - 12 (%)   Monocytes Absolute 0.3  0.1 - 1.0 (K/uL)   Eosinophils Relative 1  0 - 5 (%)   Eosinophils Absolute 0.0  0.0 - 0.7 (K/uL)   Basophils Relative 0  0 - 1 (%)   Basophils Absolute 0.0  0.0 - 0.1 (K/uL)  BASIC METABOLIC PANEL     Status: Abnormal   Collection Time   08/15/11 12:27 PM       Component Value Range   Sodium 135  135 - 145 (mEq/L)   Potassium 3.8  3.5 - 5.1 (mEq/L)   Chloride 97  96 - 112 (mEq/L)   CO2 27  19 - 32 (mEq/L)   Glucose, Bld 129 (*) 70 - 99 (mg/dL)   BUN 13  6 - 23 (mg/dL)   Creatinine, Ser 7.82  0.50 - 1.10 (mg/dL)   Calcium 8.7  8.4 - 95.6 (mg/dL)   GFR calc non Af Amer 81 (*) >90 (mL/min)   GFR calc Af Amer >90  >90 (mL/min)  CARDIAC PANEL(CRET KIN+CKTOT+MB+TROPI)     Status: Normal   Collection Time   08/15/11 12:27 PM      Component Value Range   Total CK 37  7 - 177 (U/L)   CK, MB 1.6  0.3 - 4.0 (ng/mL)   Troponin I <0.30  <0.30 (ng/mL)   Relative Index RELATIVE INDEX IS INVALID  0.0 - 2.5   AMMONIA     Status: Normal   Collection Time   08/15/11  8:53 PM      Component Value Range   Ammonia 15  11 - 60 (umol/L)  HEPATIC FUNCTION PANEL     Status: Abnormal   Collection Time   08/15/11  8:53 PM      Component Value Range   Total Protein 5.8 (*) 6.0 - 8.3 (g/dL)   Albumin 3.1 (*) 3.5 - 5.2 (g/dL)   AST 16  0 - 37 (U/L)   ALT 9  0 - 35 (U/L)   Alkaline Phosphatase 66  39 - 117 (U/L)   Total Bilirubin 0.5  0.3 - 1.2 (mg/dL)   Bilirubin, Direct 0.1  0.0 - 0.3 (mg/dL)   Indirect Bilirubin 0.4  0.3 - 0.9 (mg/dL)  COMPREHENSIVE METABOLIC PANEL     Status: Abnormal   Collection Time   08/16/11  5:22 AM      Component Value Range   Sodium 137  135 - 145 (mEq/L)   Potassium 3.5  3.5 - 5.1 (mEq/L)   Chloride 99  96 - 112 (mEq/L)   CO2 29  19 - 32 (mEq/L)   Glucose, Bld 91  70 - 99 (mg/dL)   BUN 13  6 - 23 (mg/dL)   Creatinine, Ser 1.61  0.50 - 1.10 (mg/dL)   Calcium 8.7  8.4 - 09.6 (mg/dL)   Total Protein 6.0  6.0 - 8.3 (g/dL)   Albumin 3.2 (*) 3.5 - 5.2 (g/dL)   AST 17  0 - 37 (U/L)   ALT 9  0 - 35 (U/L)   Alkaline Phosphatase 67  39 - 117 (U/L)   Total Bilirubin 0.7  0.3 - 1.2 (mg/dL)   GFR calc non Af Amer 77 (*) >90 (mL/min)   GFR calc Af Amer 89 (*) >90 (mL/min)  CBC     Status: Abnormal   Collection Time   08/16/11  5:22 AM       Component Value Range   WBC 6.4  4.0 - 10.5 (K/uL)   RBC 4.08  3.87 - 5.11 (MIL/uL)   Hemoglobin 11.8 (*) 12.0 - 15.0 (g/dL)   HCT 04.5 (*) 40.9 - 46.0 (%)   MCV 87.3  78.0 - 100.0 (fL)   MCH 28.9  26.0 - 34.0 (pg)   MCHC 33.1  30.0 - 36.0 (g/dL)   RDW 81.1  91.4 - 78.2 (%)   Platelets 217  150 - 400 (K/uL)   Lipid Panel No results found for this basename: CHOL,TRIG,HDL,CHOLHDL,VLDL,LDLCALC in the last 72 hours  Studies/Results: Dg Chest 2 View  08/15/2011  *RADIOLOGY REPORT*  Clinical Data: Unresponsive.  CHEST - 2 VIEW  Comparison: Portable chest 04/23/2011.  Findings: The heart size is normal.  Atherosclerotic calcifications are again noted in the aorta.  Chronic interstitial coarsening is evident.  There is chronic elevation of the right hemidiaphragm. Blunting of the costophrenic angles is again noted. Biapical pleural thickening is stable.  IMPRESSION:  1. 1.  No acute cardiopulmonary disease or significant interval change. 2.  Atherosclerosis. 3.  Stable chronic interstitial coarsening.  Original Report Authenticated By: Jamesetta Orleans. MATTERN, M.D.   Ct Head Wo Contrast  08/15/2011  *RADIOLOGY REPORT*  Clinical Data: Dizziness.  CT HEAD WITHOUT CONTRAST  Technique:  Contiguous axial images were obtained from the base of the skull through the vertex without contrast.  Comparison: Brain MRI head CT 04/21/2011.  Findings: No evidence of acute abnormality including infarction, hemorrhage, mass lesion, mass effect, midline shift or abnormal extra-axial fluid collection.  There is mild cortical atrophy. Left MCA aneurysm seen on the comparison MRI is not visible on this examination.  Chronic opacification of the left sphenoid sinus noted.  IMPRESSION: No acute finding.  Stable compared to prior exams.  Original Report Authenticated By: Bernadene Bell. Maricela Curet, M.D.    Medications:     Prior to Admission:  Prescriptions prior to admission  Medication Sig Dispense Refill  . amLODipine  (NORVASC) 5 MG tablet Take 5 mg by mouth daily.      Marland Kitchen aspirin 81 MG chewable tablet Chew 81 mg by mouth daily.      . cloNIDine (CATAPRES) 0.1 MG tablet Take 0.1 mg by mouth every 6 (six) hours as needed. For systolic BP greater than or equal to 170      . docusate sodium (COLACE) 100 MG capsule Take 100 mg by mouth 2 (two) times daily.      . famotidine (PEPCID) 20 MG tablet Take 20 mg by mouth 2 (two) times daily.      Marland Kitchen FeFum-FePo-FA-B Cmp-C-Zn-Mn-Cu (TANDEM PLUS) 162-115.2-1 MG CAPS Take 1  capsule by mouth daily.      Marland Kitchen gabapentin (NEURONTIN) 300 MG capsule Take 300 mg by mouth at bedtime. Takes along with 400mg  to =700mg       . gabapentin (NEURONTIN) 400 MG capsule Take 400 mg by mouth at bedtime. Take with 300 mg cap to equal 700 mg      . hydrALAZINE (APRESOLINE) 100 MG tablet Take 100 mg by mouth 3 (three) times daily.      Marland Kitchen lisinopril (PRINIVIL,ZESTRIL) 20 MG tablet Take 20 mg by mouth daily.      . Multiple Vitamin (MULITIVITAMIN WITH MINERALS) TABS Take 1 tablet by mouth daily.      . prazosin (MINIPRESS) 2 MG capsule Take 2 mg by mouth 2 (two) times daily.       . propranolol (INDERAL LA) 160 MG SR capsule Take 160 mg by mouth daily.      . traMADol (ULTRAM) 50 MG tablet Take 50 mg by mouth every 6 (six) hours as needed. For pain      . ibuprofen (ADVIL,MOTRIN) 200 MG tablet Take 200 mg by mouth 3 (three) times daily as needed. For pain        Scheduled:   . amLODipine  5 mg Oral Daily  . aspirin  81 mg Oral Daily  . docusate sodium  100 mg Oral BID  . famotidine  20 mg Oral BID  . gabapentin  300 mg Oral BID  . hydrALAZINE  100 mg Oral TID  . lisinopril  20 mg Oral Daily  . mulitivitamin with minerals  1 tablet Oral Daily  . prazosin  2 mg Oral BID  . propranolol ER  160 mg Oral Daily  . sodium chloride  3 mL Intravenous Q12H  . DISCONTD: hydrALAZINE  100 mg Oral TID  . DISCONTD: hydrALAZINE  100 mg Oral TID  . DISCONTD: prazosin  2 mg Oral BID    Assessment/Plan:     Altered mental status (04/21/2011) Assessment: Unclear etiology but episodes have been recurrent and patient is amnestic of these vents. Would rule out seizure. With poorly controlled BP and concomitant smptoms would consider PRES as well.   Plan:   1. EEG --Pending 2. MRI/MRA of the brain without contrast. MRA to be compared to that done in January. --She has refused her first MRI.  Patient states she will due MRI if given some sedation to relax her.  3. BP control without being too aggressive to prevent hypoperfusion. Last BP 216/99, 162/64, 97/60, 130/60    Felicie Morn PA-C [facility] [phone]  08/16/2011, 10:02 AM

## 2011-08-16 NOTE — Clinical Social Work Placement (Signed)
    Clinical Social Work Department CLINICAL SOCIAL WORK PLACEMENT NOTE 08/16/2011  Patient:  [name], [name]  Account Number:  [id number] Admit date:  08/15/2011  Clinical Social Worker:  Peggyann Shoals  Date/time:  08/16/2011 11:30 AM  Clinical Social Work is seeking post-discharge placement for this patient at the following level of care:   SKILLED NURSING   (*CSW will update this form in Epic as items are completed)   08/16/2011  Patient/family provided with [facility] Department of Clinical Social Work's list of facilities offering this level of care within the geographic area requested by the patient (or if unable, by the patient's family).  08/16/2011  Patient/family informed of their freedom to choose among providers that offer the needed level of care, that participate in Medicare, Medicaid or managed care program needed by the patient, have an available bed and are willing to accept the patient.  08/16/2011  Patient/family informed of [facility]' ownership interest in [facility], as well as of the fact that they are under no obligation to receive care at this facility.  PASARR submitted to EDS on 08/16/2011 PASARR number received from EDS on   FL2 transmitted to all facilities in geographic area requested by pt/family on  08/16/2011 FL2 transmitted to all facilities within larger geographic area on   Patient informed that his/her managed care company has contracts with or will negotiate with  certain facilities, including the following:     Patient/family informed of bed offers received:  08/19/2011 Patient chooses bed at [facility] Physician recommends and patient chooses bed at  [facility]  Patient to be transferred to [facility] on 08/19/2011 Patient to be transferred to facility by facility  The following physician request were entered in Epic:   Additional Comments: Pt was admitted from [facility] ALF, may need SNF. Pt has existing  PASARR.

## 2011-08-16 NOTE — Procedures (Signed)
EEG NUMBER:  13 - [id number].  This routine EEG was requested in this 78 year old woman who is a nursing home resident, who had an unresponsive episode.  She has also had episodes of shivering.  Medications include Neurontin and tramadol.  The EEG was done with the patient awake and drowsy.  During periods of maximal wakefulness, she had a 11 cycle per second posterior dominant rhythm that attenuated with eye opening and was symmetric.  Background activities were composed of low-amplitude beta activities that were symmetric.  Photic stimulation produced a minimal symmetric driving response.  The patient did become drowsy and was characterized by attenuation of muscle artifact as well as the alpha rhythm and appearance of slower diffuse delta and theta activities.  Note was made of vertex sharp waves and K complexes.  CLINICAL INTERPRETATION:  This routine EEG done with the patient awake and drowsy is normal.          ______________________________ Denton Meek, MD    [id number] D:  08/16/2011 23:31:39  T:  08/16/2011 23:45:23  Job #:  [id number]

## 2011-08-16 NOTE — Care Management Note (Signed)
    Page 1 of 1   08/19/2011     2:10:12 PM   CARE MANAGEMENT NOTE 08/19/2011  Patient:  [name], [name]   Account Number:  [id number]  Date Initiated:  08/16/2011  Documentation initiated by:  Onnie Boer  Subjective/Objective Assessment:   PT WAS ADMITTED WITH UNRESPONSIVENESS     Action/Plan:   PROGRESSION OF CARE AND DISCHARGE PLANNING   Anticipated DC Date:  08/20/2011   Anticipated DC Plan:  SKILLED NURSING FACILITY  In-house referral  Clinical Social Worker      DC Planning Services  CM consult      Choice offered to / List presented to:             Status of service:  Completed, signed off Medicare Important Message given?   (If response is "NO", the following Medicare IM given date fields will be blank) Date Medicare IM given:   Date Additional Medicare IM given:    Discharge Disposition:  SKILLED NURSING FACILITY  Per UR Regulation:  Reviewed for med. necessity/level of care/duration of stay  If discussed at Long Length of Stay Meetings, dates discussed:    Comments:  08/19/11 Onnie Boer, RN, BSN 1409 PT WAS DC'D TO [facility].  08/16/11 Onnie Boer, RN, BSN 1509 PT WAS ADMITTED WITH UNRESPONSIVENESS, FROM [facility].  WILL F/U ON DC NEEDS

## 2011-08-16 NOTE — Progress Notes (Signed)
PT Cancellation Note  Treatment cancelled today due to patient's refusal to participate.  Family and pt report not having any rest last night and would just like to sleep.  Went back to room and pt sound a sleep.  Will attempt PT evaluation tomorrow.  Garion Wempe 08/16/2011, 2:51 PM  Jake Shark, PT DPT [phone]

## 2011-08-17 ENCOUNTER — Inpatient Hospital Stay (HOSPITAL_COMMUNITY): Payer: Medicare Other

## 2011-08-17 DIAGNOSIS — I10 Essential (primary) hypertension: Secondary | ICD-10-CM

## 2011-08-17 DIAGNOSIS — R404 Transient alteration of awareness: Secondary | ICD-10-CM

## 2011-08-17 DIAGNOSIS — R5381 Other malaise: Secondary | ICD-10-CM

## 2011-08-17 DIAGNOSIS — R5383 Other fatigue: Secondary | ICD-10-CM

## 2011-08-17 DIAGNOSIS — I674 Hypertensive encephalopathy: Secondary | ICD-10-CM

## 2011-08-17 DIAGNOSIS — K7460 Unspecified cirrhosis of liver: Secondary | ICD-10-CM

## 2011-08-17 MED ORDER — HYDRALAZINE HCL 50 MG PO TABS
50.0000 mg | ORAL_TABLET | Freq: Three times a day (TID) | ORAL | Status: DC
  Administered 2011-08-17 – 2011-08-19 (×5): 50 mg via ORAL
  Filled 2011-08-17 (×9): qty 1

## 2011-08-17 MED ORDER — PRAZOSIN HCL 2 MG PO CAPS
2.0000 mg | ORAL_CAPSULE | Freq: Every day | ORAL | Status: DC
  Administered 2011-08-17 – 2011-08-18 (×2): 2 mg via ORAL
  Filled 2011-08-17 (×3): qty 1

## 2011-08-17 MED ORDER — LORAZEPAM 2 MG/ML IJ SOLN
INTRAMUSCULAR | Status: AC
  Filled 2011-08-17: qty 1

## 2011-08-17 MED ORDER — LORAZEPAM 2 MG/ML IJ SOLN
1.0000 mg | Freq: Once | INTRAMUSCULAR | Status: AC
  Administered 2011-08-17: 1 mg via INTRAVENOUS

## 2011-08-17 MED ORDER — MECLIZINE HCL 12.5 MG PO TABS
12.5000 mg | ORAL_TABLET | Freq: Three times a day (TID) | ORAL | Status: DC
  Administered 2011-08-17 – 2011-08-19 (×7): 12.5 mg via ORAL
  Filled 2011-08-17 (×9): qty 1

## 2011-08-17 MED ORDER — LISINOPRIL 20 MG PO TABS
20.0000 mg | ORAL_TABLET | Freq: Every day | ORAL | Status: DC
  Administered 2011-08-18 – 2011-08-19 (×2): 20 mg via ORAL
  Filled 2011-08-17 (×3): qty 1

## 2011-08-17 NOTE — Progress Notes (Signed)
History: HPI: [name] is an 78 y.o. female who was brought into the ED today when she had an episode of unresponsiveness this morning that lasted for less than an hour , EMS was called and was found to have blood pressure of 190/120, she was given 2 nitroglycerin tablets and became more awake and alert. The condition was associated with mild headache, blurring of vision, one episode of vomiting and generalized weakness. No tonic-clonic activity was noted. Has been having these episodes at least monthly since discharge from the hospital in January when she was found to have a MCA aneurysm that was not felt to be contributing to her symptoms. Patient is amnestic of these episodes and is unable to give any information about them. Family is not in the room and history is gleaned form admission history and physical.   Subjective: Doing OK, No significant dizziness.  No loss of consciousness.    Objective: BP 102/62  Pulse 77  Temp(Src) 98 F (36.7 C) (Oral)  Resp 20  Ht 5\' 5"  (1.651 m)  Wt 58.968 kg (130 lb)  BMI 21.63 kg/m2  SpO2 95%  Medications: Scheduled:   . aspirin  81 mg Oral Daily  . docusate sodium  100 mg Oral BID  . famotidine  20 mg Oral BID  . gabapentin  300 mg Oral BID  . hydrALAZINE  50 mg Oral TID  . lisinopril  20 mg Oral Daily  . meclizine  12.5 mg Oral TID  . mulitivitamin with minerals  1 tablet Oral Daily  . prazosin  2 mg Oral QHS  . sodium chloride  3 mL Intravenous Q12H  . DISCONTD: amLODipine  10 mg Oral Daily  . DISCONTD: amLODipine  10 mg Oral Daily  . DISCONTD: amLODipine  5 mg Oral Daily  . DISCONTD: amLODipine  5 mg Oral STAT  . DISCONTD: amLODipine  5 mg Oral Daily  . DISCONTD: hydrALAZINE  100 mg Oral TID  . DISCONTD: lisinopril  20 mg Oral Daily  . DISCONTD: lisinopril  40 mg Oral Daily  . DISCONTD: LORazepam  1 mg Intravenous Once  . DISCONTD: prazosin  2 mg Oral BID  . DISCONTD: propranolol ER  160 mg Oral Daily   Neurologic Exam: Mental  Status: Alert, oriented, thought content appropriate.  Speech fluent without evidence of aphasia. Has speech impairment due to cleft palate.   Able to follow 3 step commands without difficulty. Cranial Nerves: II- Visual fields grossly intact. III/IV/VI-Extraocular movements intact.  Pupils reactive bilaterally. V/VII-Smile symmetric VIII-hearing grossly intact IX/X-normal gag XI-bilateral shoulder shrug XII-midline tongue extension Motor: 5/5 bilaterally with normal tone and bulk Sensory: Light touch intact throughout, bilaterally Deep Tendon Reflexes: 2+ and symmetric throughout Plantars: Downgoing bilaterally Cerebellar: Normal finger-to-nose, normal rapid alternating movements and normal heel-to-shin test.   Lab Results: CBC:  Lab 08/16/11 0522 08/15/11 1227  WBC 6.4 5.0  NEUTROABS -- 3.7  HGB 11.8* 12.3  HCT 35.6* 36.9  MCV 87.3 86.8  PLT 217 187   Basic Metabolic Panel:  Lab 08/16/11 5621 08/15/11 1227  NA 137 135  K 3.5 3.8  CL 99 97  CO2 29 27  GLUCOSE 91 129*  BUN 13 13  CREATININE 0.73 0.63  CALCIUM 8.7 8.7  MG -- --  PHOS -- --   Liver Function Tests:  Lab 08/16/11 0522 08/15/11 2053  AST 17 16  ALT 9 9  ALKPHOS 67 66  BILITOT 0.7 0.5  PROT 6.0 5.8*  ALBUMIN  3.2* 3.1*   Thyroid Function Tests:  Lab 08/16/11 0522  TSH 1.062  T4TOTAL --  FREET4 --  T3FREE --  THYROIDAB --   Study Results: 08/15/2011  CHEST - 2 VIEW   Findings: The heart size is normal.  Atherosclerotic calcifications are again noted in the aorta.  Chronic interstitial coarsening is evident.  There is chronic elevation of the right hemidiaphragm. Blunting of the costophrenic angles is again noted. Biapical pleural thickening is stable.  IMPRESSION:  1.  No acute cardiopulmonary disease or significant interval change. 2.  Atherosclerosis. 3.  Stable chronic interstitial coarsening.   CHRISTOPHER W. MATTERN, M.D.   08/15/2011  CT HEAD WITHOUT CONTRAST   Findings: No evidence of acute  abnormality including infarction, hemorrhage, mass lesion, mass effect, midline shift or abnormal extra-axial fluid collection.  There is mild cortical atrophy. Left MCA aneurysm seen on the comparison MRI is not visible on this examination.  Chronic opacification of the left sphenoid sinus noted.  IMPRESSION: No acute finding.  Stable compared to prior exams.   THOMAS L. D'ALESSIO, M.D.   08/16/11 EEG The EEG was done with the patient awake and drowsy. During periods of maximal wakefulness, she had a 11 cycle per second posterior dominant rhythm that attenuates with eye opening and was symmetric. Background activities were composed of low-amplitude beta activities that were symmetric. Photic stimulation produced a minimal symmetric driving response. The patient did become drowsy and is characterized by attenuation of muscle activity of the alpha rhythm and appearance of slower diffuse delta and theta activities. Note was made of vertex sharp waves and K complexes. CLINICAL INTERPRETATION: This routine EEG done with the patient awake and drowsy is normal.  Denton Meek, MD  Assessment: Altered mental status; etiology unclear.  Appears to be cleared.  EEG negative, Head CT stable.  H/O hypertensive encephalopathy.  BP now controlled to low.  Speech impediment due to cleft palate.  Plan: 1. MRI ordered.   2. Neurology will sign off.  Please call with questions.   Patient seen and examined by Dr. [name] who concurs with above.  LOS: 2 days   Marya Fossa PA-C [facility] [phone] 08/17/2011  9:22 AM

## 2011-08-17 NOTE — Progress Notes (Signed)
Patient ID: [name]  female  [id number]:[id number]    DOB: 11-24-1925    DOA: 08/15/2011  PCP: Terald Sleeper, MD, MD  Subjective: Very pleasant today, now subjective complaints except ? Dizziness versus vertigo at times. Dix-Hallpike negative during encounter.  Objective: Weight change:   Intake/Output Summary (Last 24 hours) at 08/17/11 1130 Last data filed at 08/16/11 1457  Gross per 24 hour  Intake    240 ml  Output      0 ml  Net    240 ml   Blood pressure 105/58, pulse 77, temperature 98.4 F (36.9 C), temperature source Oral, resp. rate 18, height 5\' 5"  (1.651 m), weight 58.968 kg (130 lb), SpO2 95.00%.  Physical Exam: General: Alert and awake,, not in any acute distress, speech impediment  HEENT: anicteric sclera, pupils reactive to light and accommodation, EOMI CVS: S1-S2 clear, no murmur rubs or gallops Chest: clear to auscultation bilaterally, no wheezing, rales or rhonchi Abdomen: soft nontender, nondistended, normal bowel sounds, no organomegaly Extremities: no cyanosis, clubbing or edema noted bilaterally   Lab Results: Basic Metabolic Panel:  Lab 08/16/11 4332 08/15/11 1227  NA 137 135  K 3.5 3.8  CL 99 97  CO2 29 27  GLUCOSE 91 129*  BUN 13 13  CREATININE 0.73 0.63  CALCIUM 8.7 8.7  MG -- --  PHOS -- --   Liver Function Tests:  Lab 08/16/11 0522 08/15/11 2053  AST 17 16  ALT 9 9  ALKPHOS 67 66  BILITOT 0.7 0.5  PROT 6.0 5.8*  ALBUMIN 3.2* 3.1*    Lab 08/15/11 2053  AMMONIA 15   CBC:  Lab 08/16/11 0522 08/15/11 1227  WBC 6.4 5.0  NEUTROABS -- 3.7  HGB 11.8* 12.3  HCT 35.6* 36.9  MCV 87.3 86.8  PLT 217 187   Cardiac Enzymes:  Lab 08/15/11 1227  CKTOTAL 37  CKMB 1.6  CKMBINDEX --  TROPONINI <0.30    Studies/Results: Dg Chest 2 View  08/15/2011  *RADIOLOGY REPORT*  Clinical Data: Unresponsive.  CHEST - 2 VIEW  Comparison: Portable chest 04/23/2011.  Findings: The heart size is normal.  Atherosclerotic calcifications are  again noted in the aorta.  Chronic interstitial coarsening is evident.  There is chronic elevation of the right hemidiaphragm. Blunting of the costophrenic angles is again noted. Biapical pleural thickening is stable.  IMPRESSION:  1. 1.  No acute cardiopulmonary disease or significant interval change. 2.  Atherosclerosis. 3.  Stable chronic interstitial coarsening.  Original Report Authenticated By: Jamesetta Orleans. MATTERN, M.D.   Ct Head Wo Contrast  08/15/2011  *RADIOLOGY REPORT*  Clinical Data: Dizziness.  CT HEAD WITHOUT CONTRAST  Technique:  Contiguous axial images were obtained from the base of the skull through the vertex without contrast.  Comparison: Brain MRI head CT 04/21/2011.  Findings: No evidence of acute abnormality including infarction, hemorrhage, mass lesion, mass effect, midline shift or abnormal extra-axial fluid collection.  There is mild cortical atrophy. Left MCA aneurysm seen on the comparison MRI is not visible on this examination.  Chronic opacification of the left sphenoid sinus noted.  IMPRESSION: No acute finding.  Stable compared to prior exams.  Original Report Authenticated By: Bernadene Bell. Maricela Curet, M.D.    Medications: Scheduled Meds:    . aspirin  81 mg Oral Daily  . docusate sodium  100 mg Oral BID  . famotidine  20 mg Oral BID  . gabapentin  300 mg Oral BID  . hydrALAZINE  50 mg Oral  TID  . lisinopril  20 mg Oral Daily  . meclizine  12.5 mg Oral TID  . mulitivitamin with minerals  1 tablet Oral Daily  . prazosin  2 mg Oral QHS  . sodium chloride  3 mL Intravenous Q12H  . DISCONTD: amLODipine  10 mg Oral Daily  . DISCONTD: amLODipine  10 mg Oral Daily  . DISCONTD: amLODipine  5 mg Oral Daily  . DISCONTD: amLODipine  5 mg Oral STAT  . DISCONTD: amLODipine  5 mg Oral Daily  . DISCONTD: hydrALAZINE  100 mg Oral TID  . DISCONTD: lisinopril  20 mg Oral Daily  . DISCONTD: lisinopril  40 mg Oral Daily  . DISCONTD: LORazepam  1 mg Intravenous Once  . DISCONTD:  prazosin  2 mg Oral BID  . DISCONTD: propranolol ER  160 mg Oral Daily   Continuous Infusions:    Assessment/Plan: Active Problems:  Altered mental status: Patient alert and oriented, pleasant likely at baseline - Appreciate neurology service following, MRI/MRA pending, EEG negative for any seizures  - Medications adjusted, PT eval pending   COPD (chronic obstructive pulmonary disease): Stable   Hypertension, accelerated : BP still uncontrolled with hypertensive encephalopathy, is currently running normal to low -Norvasc discontinued, decreased hydralazine, lisinopril and prazosin   Cirrhosis stable   Hypertensive encephalopathy: Monitor closely  Generalized debility: Reviewed Child psychotherapist note  DVT Prophylaxis: SCDs  Code Status:  Disposition: Will likely need skilled nursing facility   LOS: 2 days   Numan Zylstra M.D. [facility] 08/17/2011, 11:30 AM Pager: [phone]

## 2011-08-17 NOTE — Evaluation (Signed)
Physical Therapy Evaluation Patient Details Name: [name] MRN: [id number] DOB: 03-20-1926 Today's Date: 08/17/2011 Time: 8295-6213 PT Time Calculation (min): 23 min  PT Assessment / Plan / Recommendation Clinical Impression  Pt presents with a medical diagnosis of AMS with possible vertigo. Pt with negative Dix-Hallpike test according to MD. Pt with one episode of dizziness during session with first stand, which resolved shortly afterwards. Pt is at her baseline functional level. Spoke with daughter regarding safety and supervision upon d/c due to pt with recent diziness spells, daughter agreeable. No further acute PT needs. Please reorder if necessary.    PT Assessment  Patent does not need any further PT services    Follow Up Recommendations  Supervision/Assistance - 24 hour;No PT follow up    Barriers to Discharge        lEquipment Recommendations  None recommended by PT    Recommendations for Other Services     Frequency      Precautions / Restrictions Precautions Precautions: Fall Restrictions Weight Bearing Restrictions: No         Mobility  Bed Mobility Bed Mobility: Supine to Sit;Sitting - Scoot to Edge of Bed Supine to Sit: 6: Modified independent (Device/Increase time) Sitting - Scoot to Edge of Bed: 6: Modified independent (Device/Increase time) Transfers Transfers: Sit to Stand;Stand to Sit Sit to Stand: 5: Supervision Stand to Sit: 5: Supervision Details for Transfer Assistance: VC for sequencing. Upon first stand, pt with sensation of falling backwards and sat back down on bed. No other complaints of diziness the rest of session Ambulation/Gait Ambulation/Gait Assistance: 4: Min guard Ambulation Distance (Feet): 75 Feet Assistive device: Rolling walker Ambulation/Gait Assistance Details: VC for safe distance to RW. Daughter beside pt as well as PT for safety due to recent diziness episode. No complaints throughout ambulation, VC for upright  posture Gait Pattern: Within Functional Limits Gait velocity: decreased gait speed      PT Goals Acute Rehab PT Goals PT Goal Formulation: With patient/family  Visit Information  Last PT Received On: 08/17/11 Assistance Needed: +1    Subjective Data      Prior Functioning  Home Living Lives With: Other (Comment) [facility]) Available Help at Discharge: Personal care attendant;Skilled Nursing Facility Type of Home: Apartment Home Access: Level entry Home Layout: One level Bathroom Shower/Tub: Walk-in Soil scientist: Standard Bathroom Accessibility: Yes How Accessible: Accessible via walker Home Adaptive Equipment: Bedside commode/3-in-1;Other (comment);Straight cane (rollator) Prior Function Level of Independence: Needs assistance Needs Assistance: Bathing Bath: Moderate Able to Take Stairs?: No Driving: No Vocation: Retired Comments: uses rollator all the time Communication Communication: HOH Dominant Hand: Right    Cognition  Overall Cognitive Status: Appears within functional limits for tasks assessed/performed Arousal/Alertness: Awake/alert Orientation Level: Oriented X4 / Intact Behavior During Session: WFL for tasks performed    Extremity/Trunk Assessment Right Lower Extremity Assessment RLE ROM/Strength/Tone: Within functional levels RLE Sensation: WFL - Light Touch Left Lower Extremity Assessment LLE ROM/Strength/Tone: Within functional levels LLE Sensation: WFL - Light Touch   Balance    End of Session PT - End of Session Equipment Utilized During Treatment: Gait belt Activity Tolerance: Patient tolerated treatment well Patient left: in bed;with call bell/phone within reach;with family/visitor present Nurse Communication: Mobility status   Milana Kidney 08/17/2011, 2:39 PM  08/17/2011 Milana Kidney DPT PAGER: [phone] OFFICE: [phone]

## 2011-08-18 DIAGNOSIS — R5381 Other malaise: Secondary | ICD-10-CM

## 2011-08-18 DIAGNOSIS — I10 Essential (primary) hypertension: Secondary | ICD-10-CM

## 2011-08-18 DIAGNOSIS — R5383 Other fatigue: Secondary | ICD-10-CM

## 2011-08-18 DIAGNOSIS — I674 Hypertensive encephalopathy: Secondary | ICD-10-CM

## 2011-08-18 DIAGNOSIS — K7460 Unspecified cirrhosis of liver: Secondary | ICD-10-CM

## 2011-08-18 MED ORDER — SIMETHICONE 80 MG PO CHEW
80.0000 mg | CHEWABLE_TABLET | Freq: Four times a day (QID) | ORAL | Status: DC | PRN
  Administered 2011-08-18: 80 mg via ORAL
  Filled 2011-08-18: qty 1

## 2011-08-18 NOTE — Progress Notes (Signed)
Patient ID: [name]  female  [id number]    DOB: [birth year]    DOA: 08/15/2011  PCP: Terald Sleeper, MD, MD  Subjective: "not feeling too good today", patient's daughter at the bedside, patient complaining of back pain   Objective: Weight change:   Intake/Output Summary (Last 24 hours) at 08/18/11 1258 Last data filed at 08/18/11 0900  Gross per 24 hour  Intake    360 ml  Output      0 ml  Net    360 ml   Blood pressure 115/73, pulse 84, temperature 98 F (36.7 C), temperature source Oral, resp. rate 20, height 5\' 5"  (1.651 m), weight 58.968 kg (130 lb), SpO2 92.00%.  Physical Exam: General: Alert and awake,, not in any acute distress, speech impediment  HEENT: anicteric sclera, pupils reactive to light and accommodation, EOMI CVS: S1-S2 clear, no murmur rubs or gallops Chest: clear to auscultation bilaterally, no wheezing, rales or rhonchi Abdomen: soft nontender, nondistended, normal bowel sounds, no organomegaly Extremities: no cyanosis, clubbing or edema noted bilaterally   Lab Results: Basic Metabolic Panel:  Lab 08/16/11 0865 08/15/11 1227  NA 137 135  K 3.5 3.8  CL 99 97  CO2 29 27  GLUCOSE 91 129*  BUN 13 13  CREATININE 0.73 0.63  CALCIUM 8.7 8.7  MG -- --  PHOS -- --   Liver Function Tests:  Lab 08/16/11 0522 08/15/11 2053  AST 17 16  ALT 9 9  ALKPHOS 67 66  BILITOT 0.7 0.5  PROT 6.0 5.8*  ALBUMIN 3.2* 3.1*    Lab 08/15/11 2053  AMMONIA 15   CBC:  Lab 08/16/11 0522 08/15/11 1227  WBC 6.4 5.0  NEUTROABS -- 3.7  HGB 11.8* 12.3  HCT 35.6* 36.9  MCV 87.3 86.8  PLT 217 187   Cardiac Enzymes:  Lab 08/15/11 1227  CKTOTAL 37  CKMB 1.6  CKMBINDEX --  TROPONINI <0.30    Studies/Results: Dg Chest 2 View  08/15/2011  *RADIOLOGY REPORT*  Clinical Data: Unresponsive.  CHEST - 2 VIEW  Comparison: Portable chest 04/23/2011.  Findings: The heart size is normal.  Atherosclerotic calcifications are again noted in the aorta.  Chronic  interstitial coarsening is evident.  There is chronic elevation of the right hemidiaphragm. Blunting of the costophrenic angles is again noted. Biapical pleural thickening is stable.  IMPRESSION:  1. 1.  No acute cardiopulmonary disease or significant interval change. 2.  Atherosclerosis. 3.  Stable chronic interstitial coarsening.  Original Report Authenticated By: Jamesetta Orleans. MATTERN, M.D.   Ct Head Wo Contrast  08/15/2011  *RADIOLOGY REPORT*  Clinical Data: Dizziness.  CT HEAD WITHOUT CONTRAST  Technique:  Contiguous axial images were obtained from the base of the skull through the vertex without contrast.  Comparison: Brain MRI head CT 04/21/2011.  Findings: No evidence of acute abnormality including infarction, hemorrhage, mass lesion, mass effect, midline shift or abnormal extra-axial fluid collection.  There is mild cortical atrophy. Left MCA aneurysm seen on the comparison MRI is not visible on this examination.  Chronic opacification of the left sphenoid sinus noted.  IMPRESSION: No acute finding.  Stable compared to prior exams.  Original Report Authenticated By: Bernadene Bell. Maricela Curet, M.D.    Medications: Scheduled Meds:    . aspirin  81 mg Oral Daily  . docusate sodium  100 mg Oral BID  . famotidine  20 mg Oral BID  . gabapentin  300 mg Oral BID  . hydrALAZINE  50 mg Oral TID  .  lisinopril  20 mg Oral Daily  . LORazepam      . meclizine  12.5 mg Oral TID  . mulitivitamin with minerals  1 tablet Oral Daily  . prazosin  2 mg Oral QHS  . sodium chloride  3 mL Intravenous Q12H   Continuous Infusions:    Assessment/Plan: Active Problems:   Altered mental status: Resolved, back to baseline per the daughter - Appreciate neurology service following, MRI/MRA done, in no acute infarct stable aneurysm, EEG negative for any seizures  - Medications adjusted - PT eval done, recommended 24-hour supervision, patient is currently at assisted living facility   COPD (chronic obstructive  pulmonary disease): Stable   Hypertension, accelerated: BP much better controlled now  -Norvasc discontinued, decreased hydralazine, lisinopril and prazosin. Will continue current medication regimen.    Cirrhosis stable   Hypertensive encephalopathy: Monitor closely  Generalized debility: Reviewed Child psychotherapist note  DVT Prophylaxis: SCDs  Code Status:  Disposition: PT eval recommendations noted, hopefully patient will return to ALF tomorrow, ? SNF per social worker's note on 08/16/11    LOS: 3 days   Monicka Cyran M.D. [facility] 08/18/2011, 12:58 PM Pager: [phone]

## 2011-08-19 DIAGNOSIS — I674 Hypertensive encephalopathy: Secondary | ICD-10-CM

## 2011-08-19 DIAGNOSIS — I10 Essential (primary) hypertension: Secondary | ICD-10-CM

## 2011-08-19 DIAGNOSIS — R5383 Other fatigue: Secondary | ICD-10-CM

## 2011-08-19 DIAGNOSIS — K7460 Unspecified cirrhosis of liver: Secondary | ICD-10-CM

## 2011-08-19 DIAGNOSIS — R5381 Other malaise: Secondary | ICD-10-CM

## 2011-08-19 MED ORDER — MECLIZINE HCL 12.5 MG PO TABS: 12.5000 mg | ORAL_TABLET | Freq: Three times a day (TID) | ORAL | Status: AC | PRN

## 2011-08-19 NOTE — Discharge Summary (Signed)
Patient ID: TEAIRA CROFT MRN: [id number] DOB/AGE: 76-Dec-1927 76 y.o.  Admit date: 08/15/2011 Discharge date: 08/19/2011  Primary Care Physician:  Terald Sleeper, MD, MD  Discharge Diagnoses:  Change in mental status, secondary to progressive failure to thrive, hypertensive encephalopathy  Present on Admission:  .Altered mental status .COPD (chronic obstructive pulmonary disease) .Hypertension, accelerated .Cirrhosis .Hypertensive encephalopathy  Active Problems:  Altered mental status  COPD (chronic obstructive pulmonary disease)  Hypertension, accelerated  Cirrhosis  Hypertensive encephalopathy   Medication List  As of 08/19/2011 10:51 AM   TAKE these medications         amLODipine 5 MG tablet   Commonly known as: NORVASC   Take 5 mg by mouth daily.      aspirin 81 MG chewable tablet   Chew 81 mg by mouth daily.      cloNIDine 0.1 MG tablet   Commonly known as: CATAPRES   Take 0.1 mg by mouth every 6 (six) hours as needed. For systolic BP greater than or equal to 170      docusate sodium 100 MG capsule   Commonly known as: COLACE   Take 100 mg by mouth 2 (two) times daily.      famotidine 20 MG tablet   Commonly known as: PEPCID   Take 20 mg by mouth 2 (two) times daily.      gabapentin 400 MG capsule   Commonly known as: NEURONTIN   Take 400 mg by mouth at bedtime. Take with 300 mg cap to equal 700 mg      gabapentin 300 MG capsule   Commonly known as: NEURONTIN   Take 300 mg by mouth at bedtime. Takes along with 400mg  to =700mg       hydrALAZINE 100 MG tablet   Commonly known as: APRESOLINE   Take 100 mg by mouth 3 (three) times daily.      ibuprofen 200 MG tablet   Commonly known as: ADVIL,MOTRIN   Take 200 mg by mouth 3 (three) times daily as needed. For pain        lisinopril 20 MG tablet   Commonly known as: PRINIVIL,ZESTRIL   Take 20 mg by mouth daily.      meclizine 12.5 MG tablet   Commonly known as: ANTIVERT   Take 1 tablet (12.5  mg total) by mouth 3 (three) times daily as needed for dizziness.      mulitivitamin with minerals Tabs   Take 1 tablet by mouth daily.      prazosin 2 MG capsule   Commonly known as: MINIPRESS   Take 2 mg by mouth 2 (two) times daily.      propranolol ER 160 MG SR capsule   Commonly known as: INDERAL LA   Take 160 mg by mouth daily.      Tandem Plus 162-115.2-1 MG Caps   Take 1 capsule by mouth daily.      traMADol 50 MG tablet   Commonly known as: ULTRAM   Take 50 mg by mouth every 6 (six) hours as needed. For pain            Disposition and Follow-up: Pt will need to see primary provider at the facility in 2-3 weeks post discharge.  Consults: neurology  Significant Diagnostic Studies:   Dg Chest 2 View 08/15/2011 IMPRESSION:   1.  No acute cardiopulmonary disease or significant interval change.  2.  Atherosclerosis.  3.  Stable chronic interstitial coarsening.    Ct Head Wo Contrast  08/15/2011   IMPRESSION:  No acute finding.   Stable compared to prior exams.    Mr Maxine Glenn Head Wo Contrast 08/17/2011    IMPRESSION:   1  No acute intracranial abnormality or significant interval change.  2.  A stable atrophy and white matter disease.  3.  Persistent T2 hyperintensity within the basal ganglia.  This is likely chronic.    MRA HEAD   IMPRESSION:   1.  Stable 8 mm aneurysm of the left MCA bifurcation.  2.  Stable ectasia of the cavernous carotid arteries bilaterally.  3.  Moderate small vessel disease is unchanged.    Brief H and P: Pt is 78 y.o. female who was brought into the ED after sustaining an episode of unresponsiveness morning of admission that lasted for less than an hour , EMS was called and she was found to have blood pressure of 190/120, she was given 2 nitroglycerin tablets and became more awake and alert. The condition was associated with mild headache, blurring of vision, one episode of vomiting and generalized weakness. No tonic-clonic activity was  noted. Pt has been having these episodes at least monthly since discharge from the hospital in January when she was found to have a MCA aneurysm that was not felt to be contributing to her symptoms. Patient is amnestic of these episodes and is unable to give any information about them. Family was not in the room at the time of admission and history is gleaned form ED.  Physical Exam on Discharge:  Filed Vitals:   08/18/11 2144 08/19/11 0301 08/19/11 0603 08/19/11 1010  BP: 108/71 82/53 93/55  113/68  Pulse: 87 86 101 90  Temp: 99.3 F (37.4 C) 98.3 F (36.8 C) 98.1 F (36.7 C) 98.2 F (36.8 C)  TempSrc: Oral Oral Oral Oral  SpO2: 95% 99% 95% 94%    Intake/Output Summary (Last 24 hours) at 08/19/11 1051 Last data filed at 08/18/11 1728  Gross per 24 hour  Intake    360 ml  Output      0 ml  Net    360 ml    General: Alert, awake, oriented to name only, in no acute distress. HEENT: No bruits, no goiter. Heart: Regular rate and rhythm, tachycardic earlier per nurse but on my exam HR ~86 bpm, without murmurs, rubs, gallops. Lungs: Clear to auscultation bilaterally with slightly decreased breath sounds at bases Abdomen: Soft, nontender, nondistended, positive bowel sounds. Extremities: No clubbing cyanosis or edema with positive pedal pulses. Neuro: Grossly intact, nonfocal. Neuro: Grossly nonfocal.  Labs:  Lab 08/16/11 0522 08/15/11 1227  WBC 6.4 5.0  HGB 11.8* 12.3  HCT 35.6* 36.9  PLT 217 187    Lab 08/16/11 0522 08/15/11 1227  NA 137 135  K 3.5 3.8  CL 99 97  CO2 29 27  GLUCOSE 91 129*  BUN 13 13  CREATININE 0.73 0.63  CALCIUM 8.7 8.7   Hospital Course:  Active Problems:  Altered mental status - this was determined to be most likely secondary to hypertensive encephalopathy and progressive failure to thrive - neurology has seen the pt while in hospital and recommended continuing medical management at this point - please note that CT head, MRI brain, EEG were all  unremarkable for acute events - pt is now back to her baseline mental status   COPD (chronic obstructive pulmonary disease) - this has remained stable and well controlled   Hypertension, accelerated - currently well controlled on medical therapy noted above under  medication section - pt will continue to follow up with PCP and regimen may need to be readjusted if indicated   Cirrhosis - stable at this point   Disposition - plan of care and diagnosis, diagnostic studies and test results were discussed with pt  - pt verbalized understanding - pt stable for d/c to ALF  Time spent on Discharge: Over 30 minutes  Signed: Debbora Presto 08/19/2011, 10:51 AM  [facility], pager #: [phone] Main office number: [phone]

## 2011-08-19 NOTE — Discharge Instructions (Signed)
Altered Mental Status Altered mental status most often refers to an abnormal change in your responsiveness and awareness. It can affect your speech, thought, mobility, memory, attention span, or alertness. It can range from slight confusion to complete unresponsiveness (coma). Altered mental status can be a sign of a serious underlying medical condition. Rapid evaluation and medical treatment is necessary for patients having an altered mental status. CAUSES   Low blood sugar (hypoglycemia) or diabetes.   Severe loss of body fluids (dehydration) or a body salt (electrolyte) imbalance.   A stroke or other neurologic problem, such as dementia or delirium.   A head injury or tumor.   A drug or alcohol overdose.   Exposure to toxins or poisons.   Depression, anxiety, and stress.   A low oxygen level (hypoxia).   An infection.   Blood loss.   Twitching or shaking (seizure).   Heart problems, such as heart attack or heart rhythm problems (arrhythmias).   A body temperature that is too low or too high (hypothermia or hyperthermia).  DIAGNOSIS  A diagnosis is based on your history, symptoms, physical and neurologic examinations, and diagnostic tests. Diagnostic tests may include:  Measurement of your blood pressure, pulse, breathing, and oxygen levels (vital signs).   Blood tests.   Urine tests.   X-ray exams.   A computerized magnetic scan (magnetic resonance imaging, MRI).   A computerized X-ray scan (computed tomography, CT scan).  TREATMENT  Treatment will depend on the cause. Treatment may include:  Management of an underlying medical or mental health condition.   Critical care or support in the hospital.  HOME CARE INSTRUCTIONS   Only take over-the-counter or prescription medicines for pain, discomfort, or fever as directed by your caregiver.   Manage underlying conditions as directed by your caregiver.   Eat a healthy, well-balanced diet to maintain strength.    Join a support group or prevention program to cope with the condition or trauma that caused the altered mental status. Ask your caregiver to help choose a program that works for you.   Follow up with your caregiver for further examination, therapy, or testing as directed.  SEEK MEDICAL CARE IF:   You feel unwell or have chills.   You or your family notice a change in your behavior or your alertness.   You have trouble following your caregiver's treatment plan.   You have questions or concerns.  SEEK IMMEDIATE MEDICAL CARE IF:   You have a rapid heartbeat or have chest pain.   You have difficulty breathing.   You have a fever.   You have a headache with a stiff neck.   You cough up blood.   You have blood in your urine or stool.   You have severe agitation or confusion.  MAKE SURE YOU:   Understand these instructions.   Will watch your condition.   Will get help right away if you are not doing well or get worse.  Document Released: 09/12/2009 Document Revised: 03/14/2011 Document Reviewed: 09/12/2009 [employer] Patient Information 2012 [employer]. 

## 2011-08-19 NOTE — Progress Notes (Signed)
Pt discharged to [facility]. Vital signs stable all prescriptions and follow up appointments reviewed. Marc Morgans, RN

## 2011-08-19 NOTE — Clinical Social Work Note (Signed)
Pt is ready for discharge to [facility]. Pt and family are agreeable to discharge plan. Facility is ready to accept pt as they received the discharge summary. Pt to be transported by facility. CSW is signing off as no further clinical social work need identified.  Dede Query, MSW, Theresia Majors [phone]

## 2012-05-29 IMAGING — CT CT HEAD W/O CM
2 series · 16 of 30 positions shown, 20 images · non-contrast
Comparison: Maxillofacial CT dated 09/24/2010.  CT head dated
05/28/2010.

CLINICAL DATA: Hypertension, dizziness

CT HEAD WITHOUT CONTRAST
TECHNIQUE: Contiguous axial images were obtained from the base of
the skull through the vertex without contrast.

[Series 2: head w/o · axial · non-contrast · 0.49mm/px · z∈[+214,+344]mm · 13 of 32 slices shown, 17 images]
[im 3/32  brain]
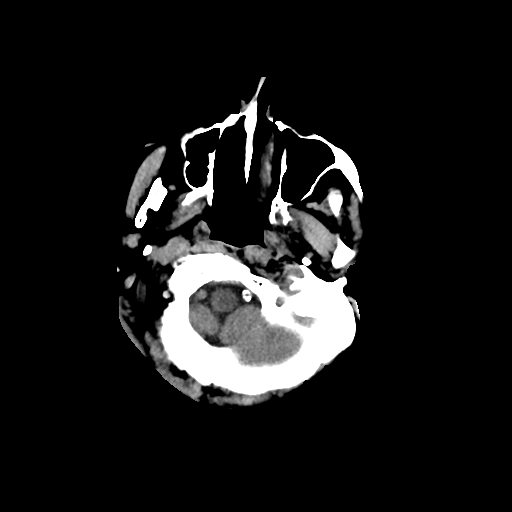
[im 3/32  bone]
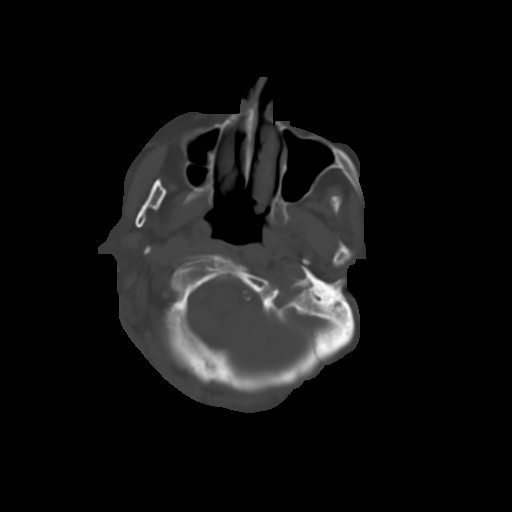
[im 5/32  brain]
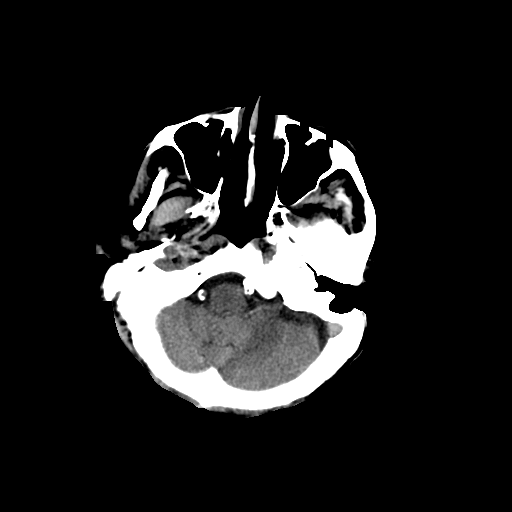
[im 7/32  brain]
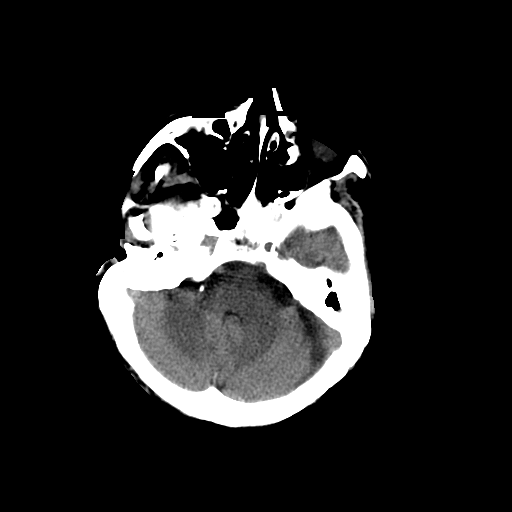
[im 9/32  brain]
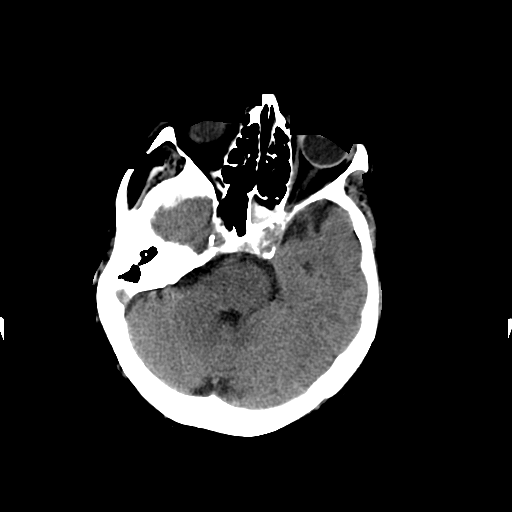
[im 12/32  brain]
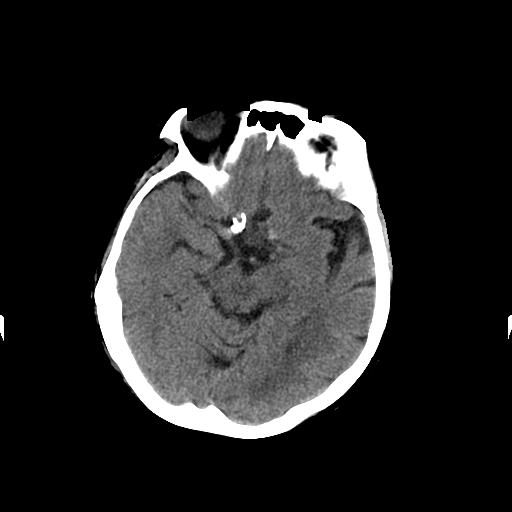
[im 12/32  bone]
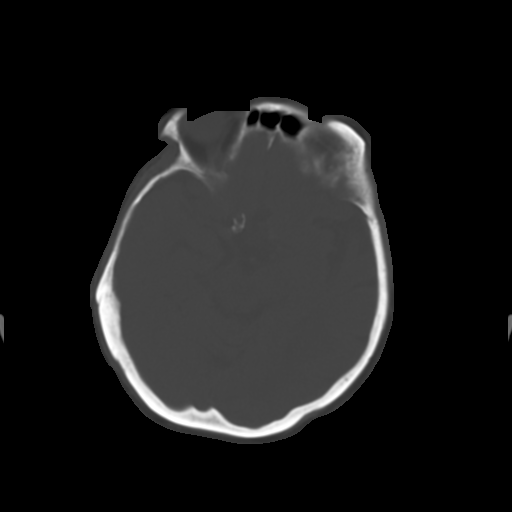
[im 14/32  brain]
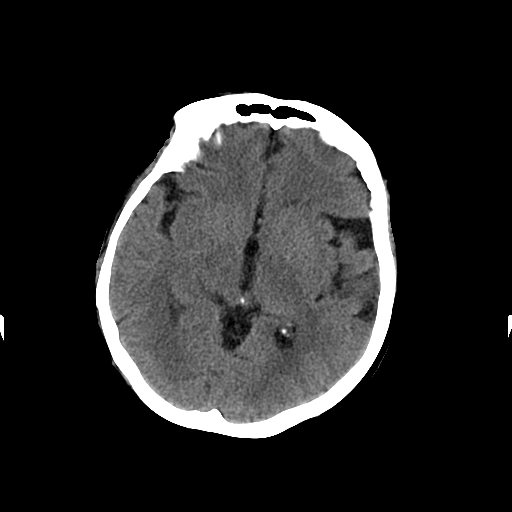
[im 16/32  brain]
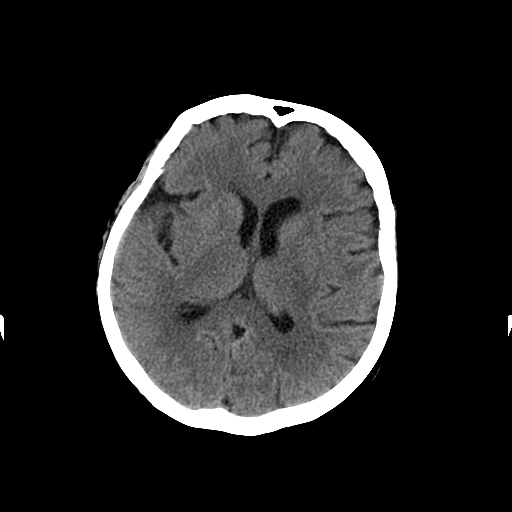
[im 18/32  brain]
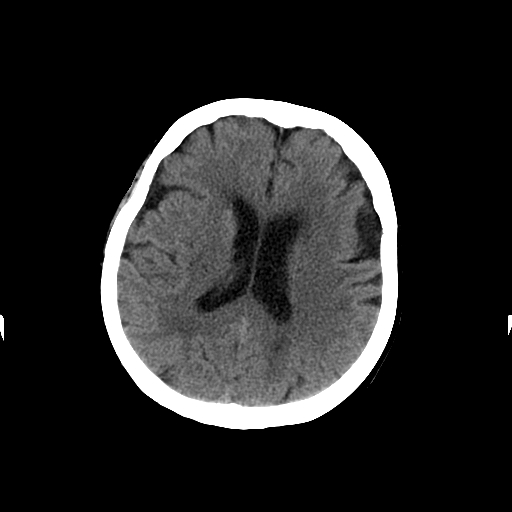
[im 20/32  brain]
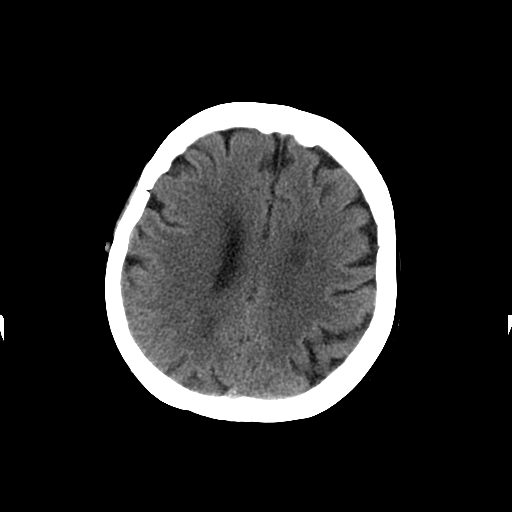
[im 20/32  bone]
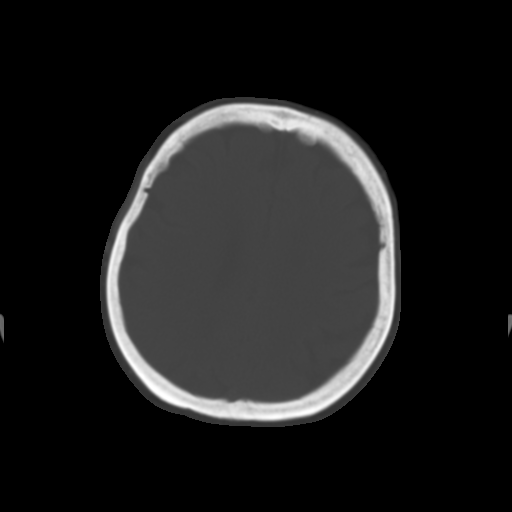
[im 23/32  brain]
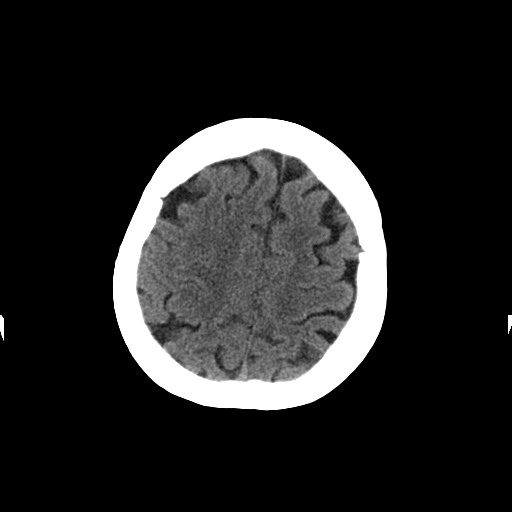
[im 25/32  brain]
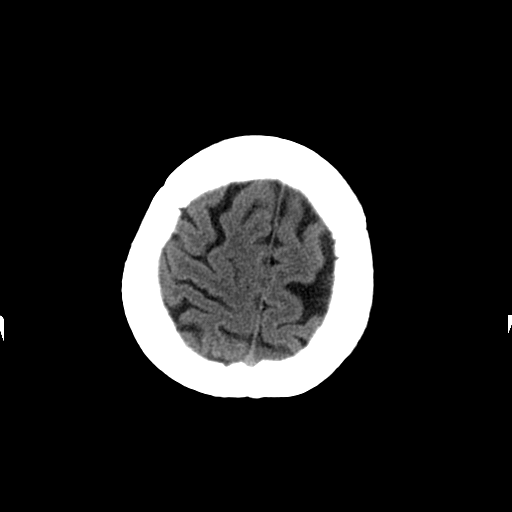
[im 27/32  brain]
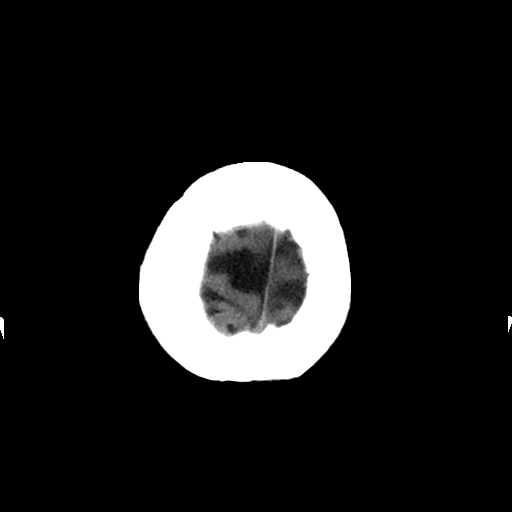
[im 29/32  brain]
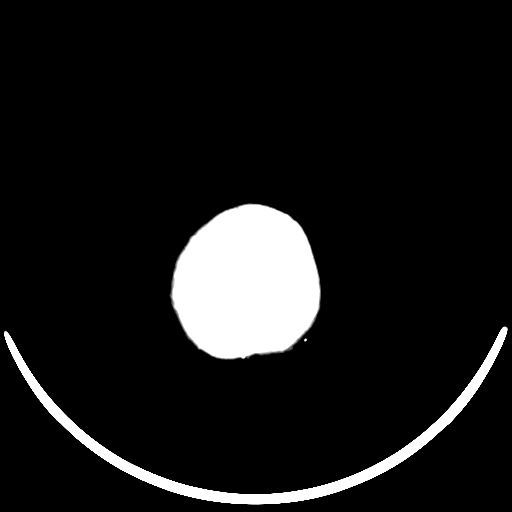
[im 29/32  bone]
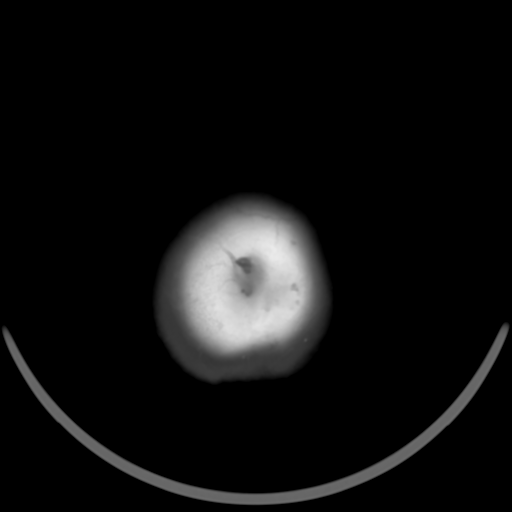

[Series 3: head w/o bone · axial · non-contrast · 0.49mm/px · z∈[+214,+259]mm · 3 of 32 slices shown]
[im 3/32  bone]
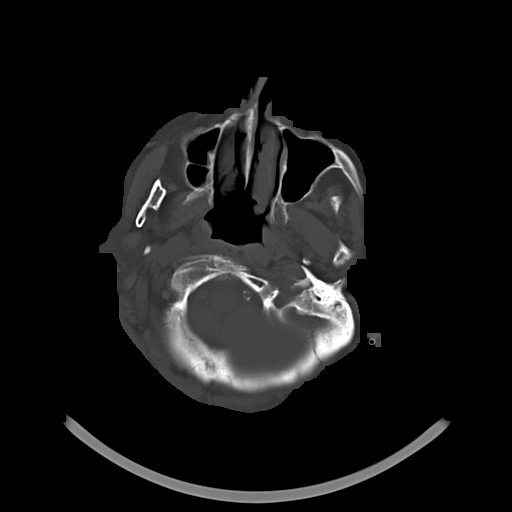
[im 7/32  bone]
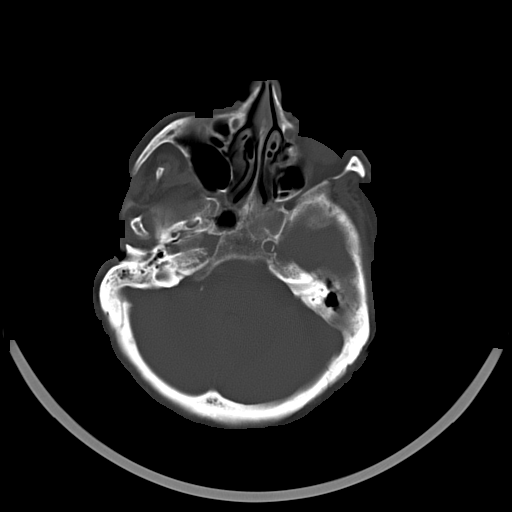
[im 12/32  bone]
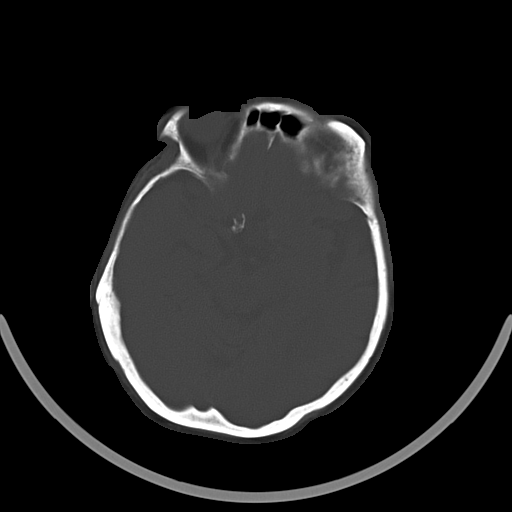

[16 of 30 positions shown; findings below may reference images not displayed]

FINDINGS: No evidence of parenchymal hemorrhage or extra-axial
fluid collection. No mass lesion, mass effect, or midline shift.

No CT evidence of acute infarction.

Subcortical white matter and periventricular small vessel ischemic
changes.  Intracranial atherosclerosis.

Mild cortical atrophy with secondary ventricular prominence.

Opacification of the left sphenoid sinus, chronic.  Status post
left mastoidectomy.  Right mastoid air cells are clear.

Right scalp lipoma (series 2/image 21).

No evidence of calvarial fracture.
IMPRESSION: No evidence of acute intracranial abnormality.

Atrophy with small vessel ischemic changes.

## 2012-05-29 IMAGING — CR DG CHEST 2V
3 series · 3 of 3 positions shown · non-contrast
Comparison: 01/07/2011

CLINICAL DATA: Hypertension

CHEST - 2 VIEW

[w chest lat (1 of 2)]
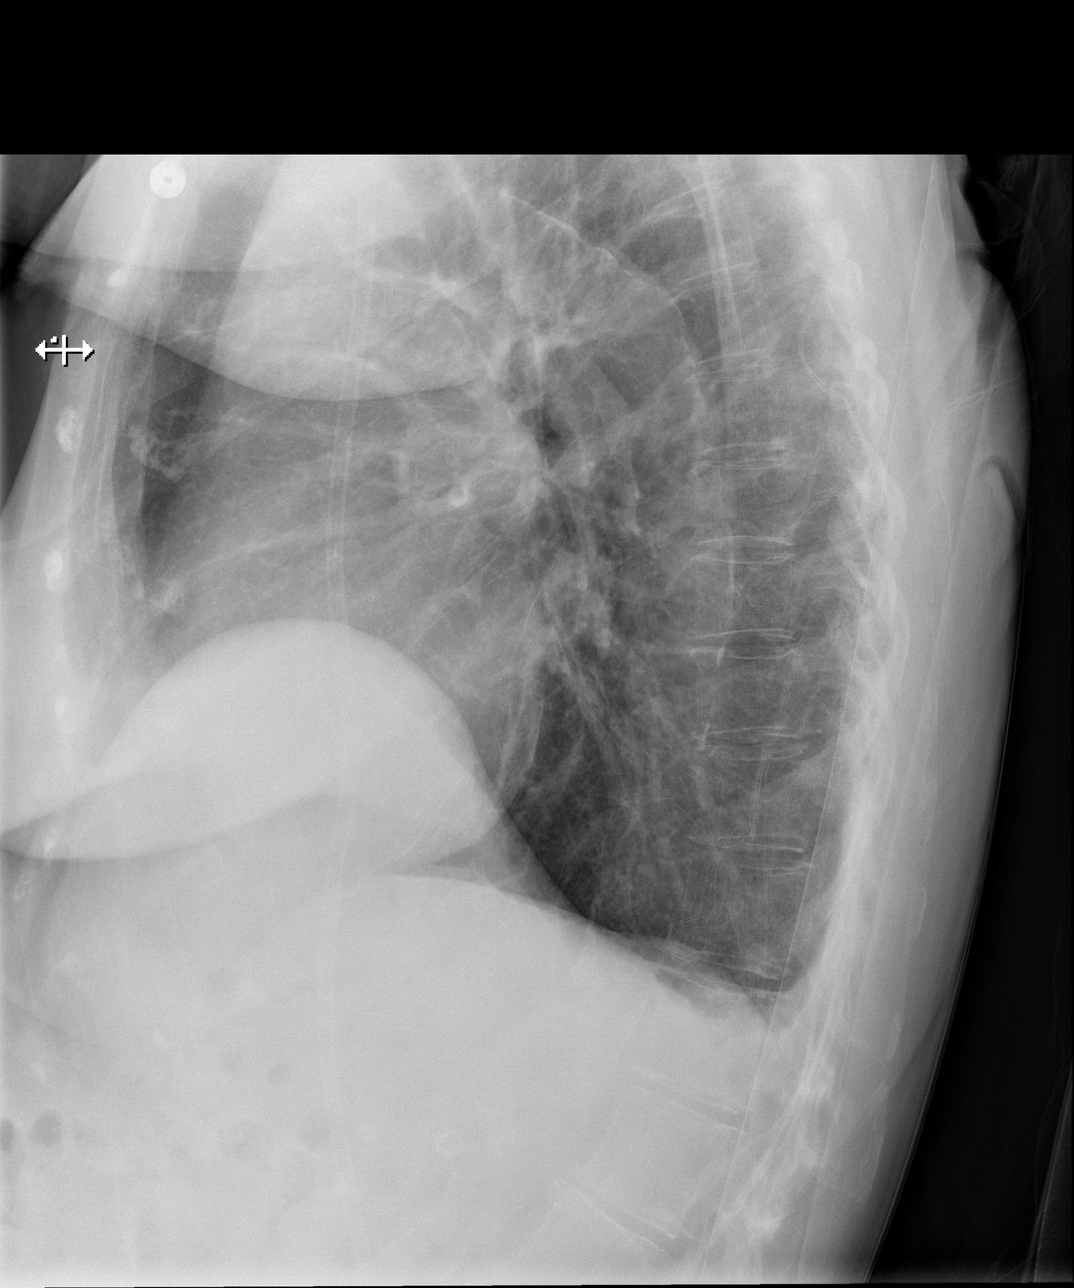

[w chest lat (2 of 2)]
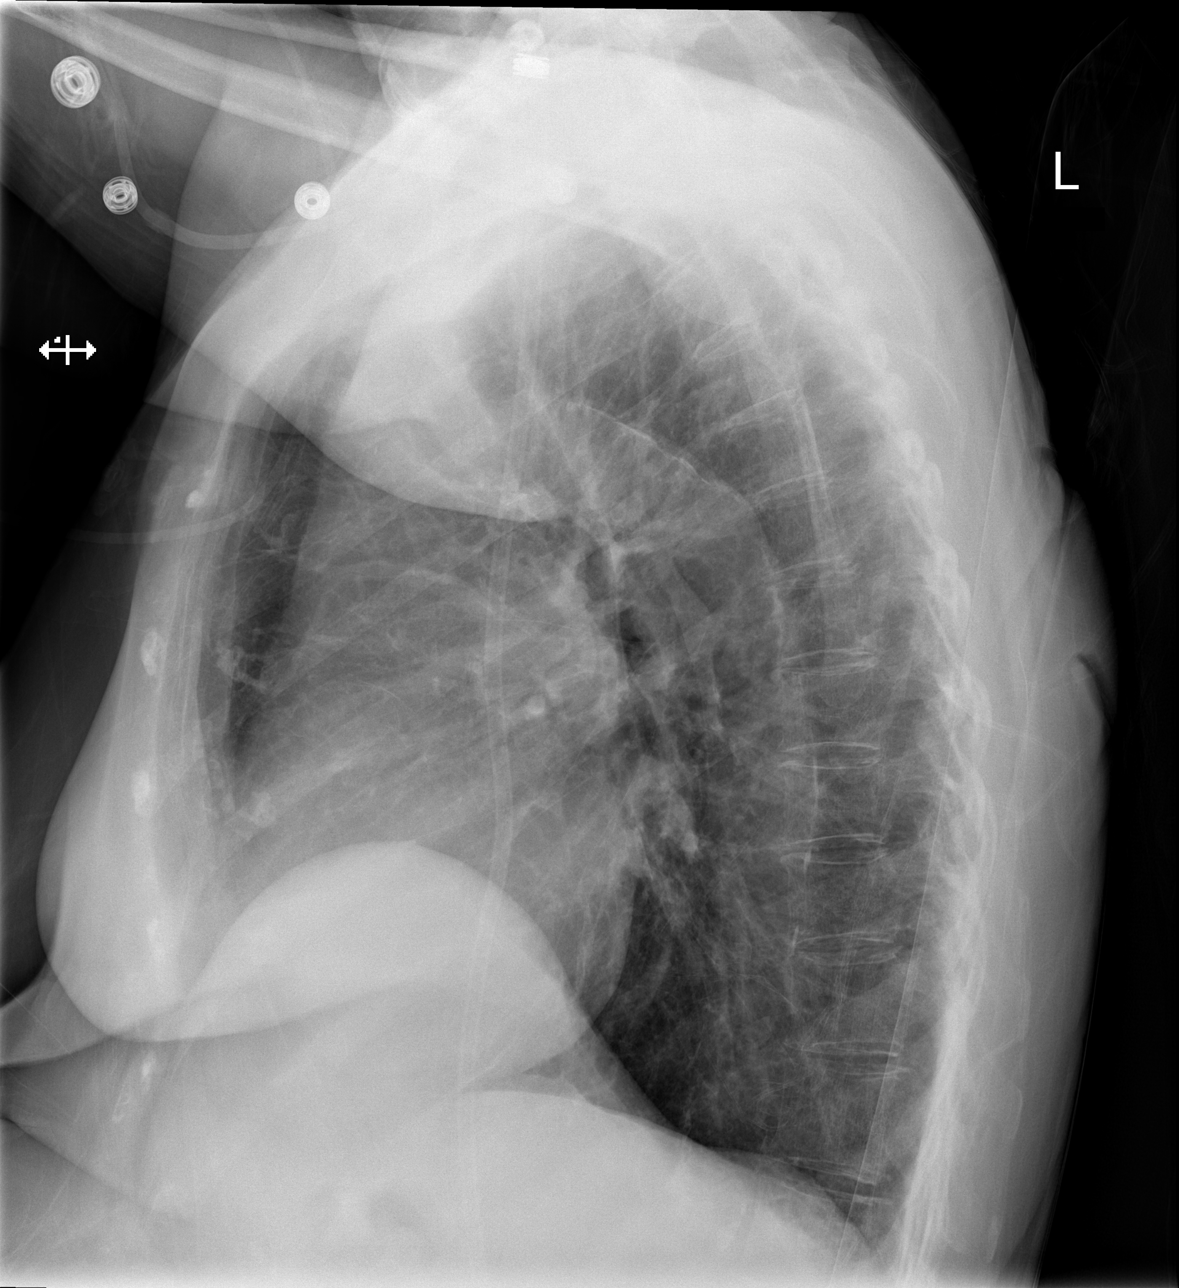

[x chest ap]
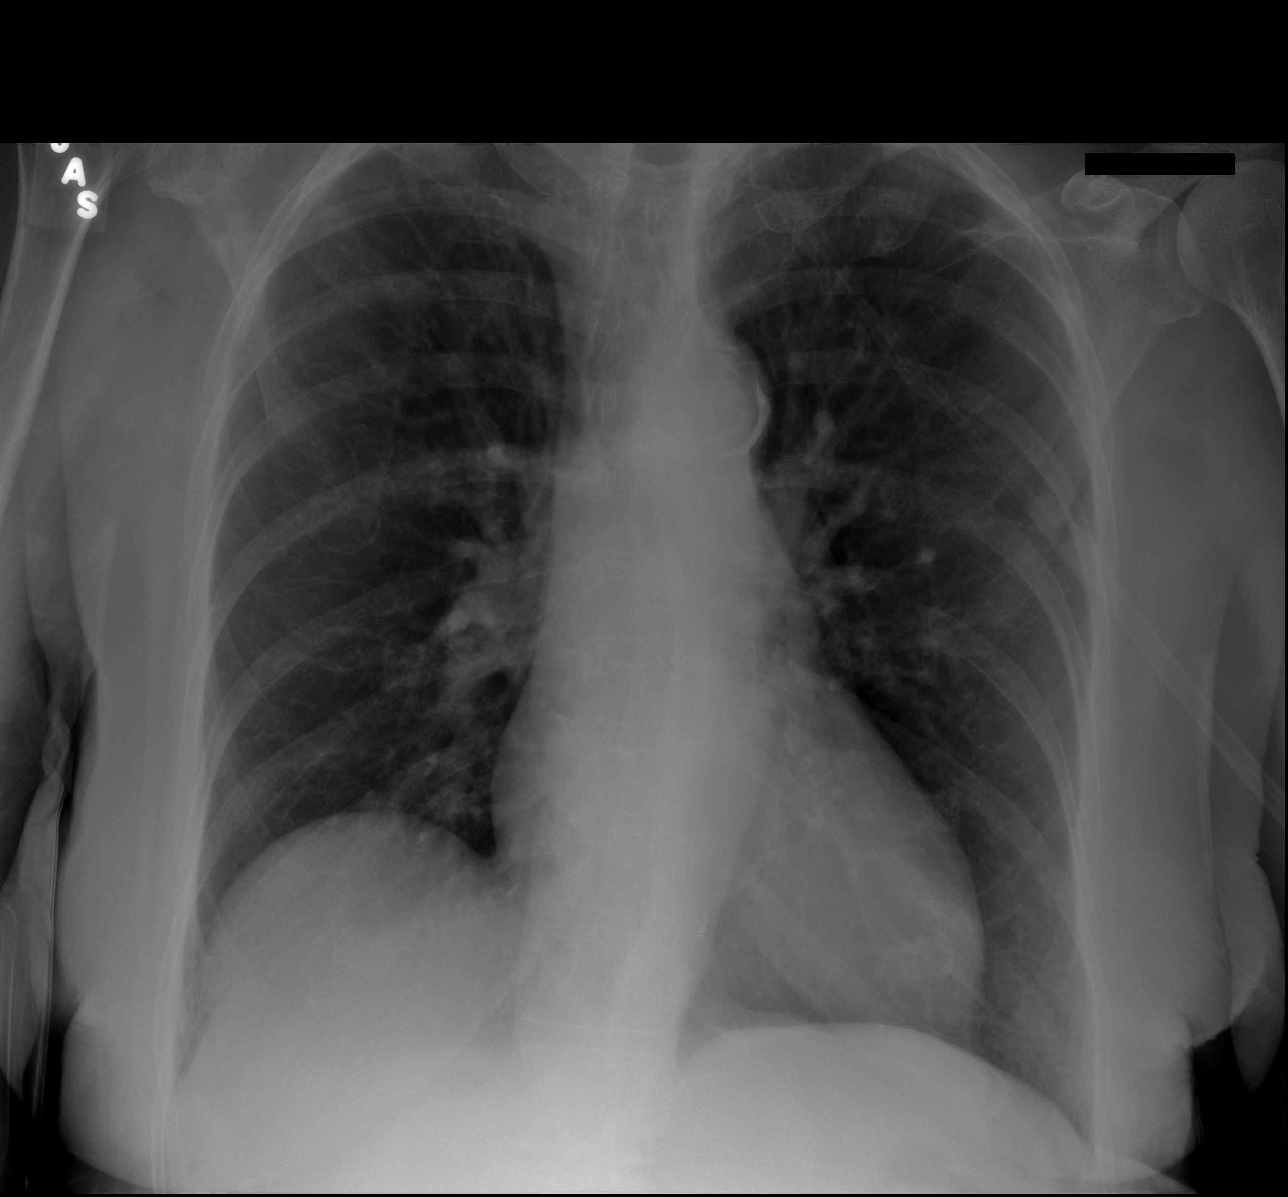

[3 of 3 positions shown; findings below may reference images not displayed]

FINDINGS: Chronic interstitial markings.  Stable elevation of the
right hemidiaphragm with a tiny right pleural effusion on the
lateral view.  No pneumothorax.

Cardiomediastinal silhouette is within normal limits.

Mild degenerative changes of the visualized thoracolumbar spine.
IMPRESSION: Chronic interstitial markings with stable tiny right pleural
effusion.

## 2012-07-15 DIAGNOSIS — G8929 Other chronic pain: Secondary | ICD-10-CM

## 2012-07-15 DIAGNOSIS — J449 Chronic obstructive pulmonary disease, unspecified: Secondary | ICD-10-CM

## 2012-07-15 DIAGNOSIS — J4489 Other specified chronic obstructive pulmonary disease: Secondary | ICD-10-CM

## 2012-08-12 DIAGNOSIS — R1310 Dysphagia, unspecified: Secondary | ICD-10-CM

## 2012-08-12 DIAGNOSIS — J449 Chronic obstructive pulmonary disease, unspecified: Secondary | ICD-10-CM

## 2012-09-14 DIAGNOSIS — N189 Chronic kidney disease, unspecified: Secondary | ICD-10-CM

## 2012-09-14 DIAGNOSIS — I1310 Hypertensive heart and chronic kidney disease without heart failure, with stage 1 through stage 4 chronic kidney disease, or unspecified chronic kidney disease: Secondary | ICD-10-CM

## 2012-09-14 DIAGNOSIS — I671 Cerebral aneurysm, nonruptured: Secondary | ICD-10-CM

## 2012-10-28 ENCOUNTER — Non-Acute Institutional Stay (SKILLED_NURSING_FACILITY): Payer: PRIVATE HEALTH INSURANCE | Admitting: Internal Medicine

## 2012-10-28 DIAGNOSIS — M546 Pain in thoracic spine: Secondary | ICD-10-CM

## 2012-10-28 DIAGNOSIS — K7460 Unspecified cirrhosis of liver: Secondary | ICD-10-CM

## 2012-10-28 DIAGNOSIS — I10 Essential (primary) hypertension: Secondary | ICD-10-CM

## 2012-10-28 NOTE — Progress Notes (Signed)
Patient ID: [name], female   DOB: 14-Nov-1925, 79 y.o.   MRN: [id number] Facility; [facility]. Chief complaint low back pain. Review of general medical issues/routine [facility] note from June. History patient has been in the building since June of 2012. She has a history of altered mental status and accelerated hypertension. According to the patient and her family and accelerated hypertension. Has been episodic and in the past is made me wonder about whether she has secondary hypertension or not. In any case, most of her record her blood pressures seem to be stable in the 120 to 1:30 range. She has been no particular issues with regards to this in quite some time now. Recent lab work in mid-May 6, showed a BUN of fall, creatinine 0.7.  Today. The patient went for exercise and although she did not fall. She has been complaining of severe low back pain since. I was asked also to review her for this.  Past Medical History  Diagnosis Date  . Chronic back pain   . Obstructive chronic bronchitis   . Muscle weakness   . Cirrhosis   . Cerebral aneurysm 07/2011    "non ruptured; left side; behind optical nerve"  . H/O hiatal hernia   . Complication of anesthesia     "don't like medicine or to be put to sleep"  . HOH (hard of hearing), right   . Deaf, left   . Hypertension   . Hypertensive encephalopathy   . Pacemaker     'as a child"  . DVT (deep venous thrombosis), right 1970's    "not long after knee OR"  . Emphysema   . Sleep apnea     "doesn't wear mask"  . Exertional dyspnea   . Shortness of breath 08/15/11    "& when lying down"  . GERD (gastroesophageal reflux disease)   . Hepatitis   . Steatohepatitis, non-alcoholic   . Headache   . Migraines   . Scoliosis   . Osteoarthritis of lumbar spine   . History of shingles 05/2010   Past Surgical History  Procedure Laterality Date  . Vaginal hysterectomy  1980's  . Knee arthroscopy  ?1970's    right  . Tympanoplasty  1950's     removed; left  . Myringotomy  08/2011    right  . Dilation and curettage of uterus  1970's    Medication list is reviewed. Social history; the patient is a DO NOT RESUSCITATE.  Review of systems GU no dysuria or hematuria. Abdomen no abdominal pain.  Physical exam weight is 130 pounds. This appears to be generally stable. Pulse 80, respirations 18. Respiratory clear entry bilaterally. Cardiac heart sounds are normal. No murmurs 1+ edema is noted in her legs. No evidence of DVT. Abdomen no liver no spleen no tenderness. GU there is no suprapubic or costovertebral tenderness.  Musculoskeletal; at roughly the L1 and L2 levels to the right of the spinous process. There is really marked tenderness. The muscles are firm and exceedingly tender. There are no similar findings on the left.  Impression/plan #1 hypertension, which has been very refractory in the past seems to be stable. #2 cirrhosis of the liver does not appear to be a major issue on need to research what we know about this at some point. #3 acute back pain as described above. At roughly the L1, L2 levels. She has an intolerance/allergy to Tylenol. She is already received Motrin, which did not work. I will probably give  her a skeletal muscle relaxant and in addition, something for analgesia.

## 2012-12-06 ENCOUNTER — Non-Acute Institutional Stay (SKILLED_NURSING_FACILITY): Payer: PRIVATE HEALTH INSURANCE | Admitting: Internal Medicine

## 2012-12-06 DIAGNOSIS — I10 Essential (primary) hypertension: Secondary | ICD-10-CM

## 2012-12-06 DIAGNOSIS — K7460 Unspecified cirrhosis of liver: Secondary | ICD-10-CM

## 2012-12-06 NOTE — Progress Notes (Signed)
Patient ID: [name], female   DOB: Jan 15, 1926, 79 y.o.   MRN: [id number] Patient ID: [name], female   DOB: 1926-02-27, 79 y.o.   MRN: [id number] Facility; [facility]. Chief complaint low back pain. Review of general medical issues/routine [facility] note from July. History patient has been in the building since June of 2012. She has a history of altered mental status and accelerated hypertension. According to the patient and her family and accelerated hypertension. Has been episodic and in the past is made me wonder about whether she has secondary hypertension or not. In any case, most of her record her blood pressures seem to be stable in the 110 to 120 systolic. She has been no particular issues with regards to this in quite some time now. Recent lab work in mid-May 6, showed a BUN of fall, creatinine 0.7.  Her back pain last month resolved with simple analgesics and skeletal muscle relaxants.   Past Medical History  Diagnosis Date  . Chronic back pain   . Obstructive chronic bronchitis   . Muscle weakness   . Cirrhosis   . Cerebral aneurysm 07/2011    "non ruptured; left side; behind optical nerve"  . H/O hiatal hernia   . Complication of anesthesia     "don't like medicine or to be put to sleep"  . HOH (hard of hearing), right   . Deaf, left   . Hypertension   . Hypertensive encephalopathy   . Pacemaker     'as a child"  . DVT (deep venous thrombosis), right 1970's    "not long after knee OR"  . Emphysema   . Sleep apnea     "doesn't wear mask"  . Exertional dyspnea   . Shortness of breath 08/15/11    "& when lying down"  . GERD (gastroesophageal reflux disease)   . Hepatitis   . Steatohepatitis, non-alcoholic   . Headache   . Migraines   . Scoliosis   . Osteoarthritis of lumbar spine   . History of shingles 05/2010   Past Surgical History  Procedure Laterality Date  . Vaginal hysterectomy  1980's  . Knee arthroscopy  ?1970's    right  . Tympanoplasty   1950's    removed; left  . Myringotomy  08/2011    right  . Dilation and curettage of uterus  1970's    Medication list is reviewed. Social history; the patient is a DO NOT RESUSCITATE.  Review of systems GU no dysuria or hematuria. Abdomen no abdominal pain.  Physical exam weight is 130 pounds. This appears to be generally stable. Pulse 80, respirations 18. Respiratory clear entry bilaterally. Cardiac heart sounds are normal. No murmurs 1+ edema is noted in her legs. No evidence of DVT. Abdomen no liver no spleen no tenderness. GU there is no suprapubic or costovertebral tenderness.  Musculoskeletal; at roughly the L1 and L2 levels to the right of the spinous process. There is really marked tenderness. The muscles are firm and exceedingly tender. There are no similar findings on the left.  Impression/plan #1 hypertension, which has been very refractory in the past seems to be stable. #2 cirrhosis of the liver does not appear to be a major issue on need to research what we know about this at some point.

## 2013-01-04 ENCOUNTER — Non-Acute Institutional Stay (SKILLED_NURSING_FACILITY): Payer: PRIVATE HEALTH INSURANCE | Admitting: Internal Medicine

## 2013-01-04 DIAGNOSIS — K7460 Unspecified cirrhosis of liver: Secondary | ICD-10-CM

## 2013-01-04 DIAGNOSIS — I10 Essential (primary) hypertension: Secondary | ICD-10-CM

## 2013-01-04 NOTE — Progress Notes (Signed)
Patient ID: [name], female   DOB: November 29, 1925, 79 y.o.   MRN: [id number]  Facility; [facility]. Chief complaint low back pain. Review of general medical issues/routine [facility] note from August History patient has been in the building since June of 2012. She has a history of altered mental status and accelerated hypertension. According to the patient and her family and accelerated hypertension. Has been episodic and in the past is made me wonder about whether she has secondary hypertension or not. In any case, most of her record her blood pressures seem to be stable in the 110 to 120 systolic. She has been no particular issues with regards to this in quite some time now. Recent lab work in mid-May 6, showed a BUN of fall, creatinine 0.7.  Her back pain 2 months resolved with simple analgesics and skeletal muscle relaxants.   Past Medical History  Diagnosis Date  . Chronic back pain   . Obstructive chronic bronchitis   . Muscle weakness   . Cirrhosis   . Cerebral aneurysm 07/2011    "non ruptured; left side; behind optical nerve"  . H/O hiatal hernia   . Complication of anesthesia     "don't like medicine or to be put to sleep"  . HOH (hard of hearing), right   . Deaf, left   . Hypertension   . Hypertensive encephalopathy   . Pacemaker     'as a child"  . DVT (deep venous thrombosis), right 1970's    "not long after knee OR"  . Emphysema   . Sleep apnea     "doesn't wear mask"  . Exertional dyspnea   . Shortness of breath 08/15/11    "& when lying down"  . GERD (gastroesophageal reflux disease)   . Hepatitis   . Steatohepatitis, non-alcoholic   . Headache   . Migraines   . Scoliosis   . Osteoarthritis of lumbar spine   . History of shingles 05/2010   Past Surgical History  Procedure Laterality Date  . Vaginal hysterectomy  1980's  . Knee arthroscopy  ?1970's    right  . Tympanoplasty  1950's    removed; left  . Myringotomy  08/2011    right  . Dilation and  curettage of uterus  1970's    Medication list is reviewed. Social history; the patient is a DO NOT RESUSCITATE.  Review of systems GU no dysuria or hematuria. Abdomen no abdominal pain.  Physical exam weight is 130 pounds, temperature 98.2, pulse 64, respirations 20 blood pressure 124/70 Respiratory clear entry bilaterally. Cardiac heart sounds are normal. No murmurs 1+ edema is noted in her legs. No evidence of DVT. Abdomen no liver no spleen no tenderness. GU there is no suprapubic or costovertebral tenderness.   Impression/plan #1 hypertension, which has been very refractory in the past seems to be stable. #2 cirrhosis of the liver does not appear to be a major issue on need to research what we know about this at some point.

## 2013-01-31 ENCOUNTER — Non-Acute Institutional Stay (SKILLED_NURSING_FACILITY): Payer: PRIVATE HEALTH INSURANCE | Admitting: Internal Medicine

## 2013-01-31 DIAGNOSIS — I10 Essential (primary) hypertension: Secondary | ICD-10-CM

## 2013-01-31 DIAGNOSIS — K7460 Unspecified cirrhosis of liver: Secondary | ICD-10-CM

## 2013-01-31 DIAGNOSIS — I729 Aneurysm of unspecified site: Secondary | ICD-10-CM

## 2013-01-31 NOTE — Progress Notes (Signed)
Patient ID: [name], female   DOB: [birth year], 79 y.o.   MRN: [id number]   Facility; [facility]. Chief complaint low back pain. Review of general medical issues/routine [facility] note from September History patient has been in the building since June of 2012. She has a history of altered mental status and accelerated hypertension. According to the patient and her family and accelerated hypertension. Has been episodic and in the past is made me wonder about whether she has secondary hypertension or not. In any case, most of her record her blood pressures seem to be stable in the 110 to 120 systolic. She has been no particular issues with regards to this in quite some time now. Recent lab work in mid-May 6, showed a BUN of 12, creatinine 0.7.  Her back pain 3 months resolved with simple analgesics and skeletal muscle relaxants.   I have done a review of EPIC. I do not see anything that documents cirrhosis of the liver. She had an ultrasound of the abdomen from 2012 which I have included below. Liver function tests have not been abnormal. This diagnosis does not seem to be well documented    Past Medical History  Diagnosis Date  . Chronic back pain   . Obstructive chronic bronchitis   . Muscle weakness   . Cirrhosis   . Cerebral aneurysm 07/2011    "non ruptured; left side; behind optical nerve"  . H/O hiatal hernia   . Complication of anesthesia     "don't like medicine or to be put to sleep"  . HOH (hard of hearing), right   . Deaf, left   . Hypertension   . Hypertensive encephalopathy   . Pacemaker     'as a child"  . DVT (deep venous thrombosis), right 1970's    "not long after knee OR"  . Emphysema   .             lep apnea     "doesn't wear mask"  . Exertional dyspnea   . Shortness of breath 08/15/11    "& when lying down"  . GERD (gastroesophageal reflux disease)   . Hepatitis   . Steatohepatitis, non-alcoholic   . Headache   . Migraines   . Scoliosis   .  Osteoarthritis of lumbar spine   . History of shingles 05/2010   Past Surgical History  Procedure Laterality Date  . Vaginal hysterectomy  1980's  . Knee arthroscopy  ?1970's    right  . Tympanoplasty  1950's    removed; left  . Myringotomy  08/2011    right  . Dilation and curettage of uterus  1970's    Medication list is reviewed. Social history; the patient is a DO NOT RESUSCITATE.  Review of systems GU no dysuria or hematuria. Abdomen no abdominal pain.  Physical exam weight is 130 pounds, temperature 98.2, pulse 64, respirations 20 blood pressure 124/70 Respiratory clear entry bilaterally. Cardiac heart sounds are normal. No murmurs 1+ edema is noted in her legs. No evidence of DVT. Abdomen no liver no spleen no tenderness. GU there is no suprapubic or costovertebral tenderness.   Impression/plan #1 hypertension, which has been very refractory in the past seems to be stable. #2 cirrhosis of the liver; I think this diagnosis is probably in error #3 left middle cerebral artery aneurysm. I do not believe this lady is a candidate for aggressive evaluation of this however meticulous control of her blood pressure is probably in order most  recently listed in the record at 110 which is reasonable    Study Result    *RADIOLOGY REPORT*   Clinical Data:  Follow-up aneurysm.  Malignant hypertension. Episode of unresponsiveness this morning.   MRI HEAD WITHOUT CONTRAST MRA HEAD WITHOUT CONTRAST   Technique:  Multiplanar, multiecho pulse sequences of the brain and surrounding structures were obtained without intravenous contrast. Angiographic images of the head were obtained using MRA technique without contrast.   Comparison:  CT head 08/15/2011.  MRI head 04/21/2011.   MRI HEAD   Findings:  No acute infarct, hemorrhage, mass lesion is present. Mild generalized atrophy and white matter disease is similar to the prior exam.  There is no hemorrhage or mass lesion.   T2 hyperintensity in the basal ganglia is slightly less conspicuous than on the prior exam.  The globes and orbits are intact.  The paranasal sinuses are clear.  There is some fluid in the mastoid air cells, right greater than left.   Chronic opacification of the left sphenoid sinus is again noted. The paranasal sinuses are otherwise clear.  A small amount of fluid is present in the mastoid air cells.  No obstructing nasopharyngeal lesion is evident.   IMPRESSION:   1.  No acute intracranial abnormality or significant interval change. 2.  A stable atrophy and white matter disease. 3.  Persistent T2 hyperintensity within the basal ganglia.  This is likely chronic.   MRA HEAD   Findings: The internal carotid arteries are ectatic in the cavernous segments.  There is mild irregularity of the A1 and M1 segments without significant stenosis.  A 7-8 mm aneurysm of the left MCA bifurcation is stable.  The anterior communicating artery is patent.  Moderate small vessel disease is similar to the prior exam.   The left vertebral artery is the dominant vessel.  Both PICA origins are visualized and within normal limits.  The right posterior cerebral artery is of fetal type.  A small P1 segment is present.  The left posterior cerebral artery originates from the basilar tip.  Segmental attenuation of distal PCA branch vessels is stable.   IMPRESSION:   1.  Stable 8 mm aneurysm of the left MCA bifurcation. 2.  Stable ectasia of the cavernous carotid arteries bilaterally. 3.  Moderate small vessel disease is unchanged.         *RADIOLOGY REPORT*   Clinical Data:  Abdominal pain   ABDOMINAL ULTRASOUND COMPLETE   Comparison:  None.   Findings:   Gallbladder:  No gallstones, gallbladder wall thickening, or pericholecystic fluid.   Common Bile Duct:  Within normal limits in caliber.   Liver: No focal mass lesion identified.  Within normal limits in parenchymal echogenicity.    IVC:  Appears normal.   Pancreas:  No abnormality identified.   Spleen:  Within normal limits in size and echotexture.   Right kidney:  Normal in size and parenchymal echogenicity.  No evidence of mass or hydronephrosis.   Left kidney:  Normal in size and parenchymal echogenicity.  No evidence of mass or hydronephrosis.   Abdominal Aorta:  No aneurysm identified.   Incidental note is made of a right pleural effusion.   IMPRESSION: Negative abdominal ultrasound.   Original Report Authenticated By: Rosealee Albee

## 2013-03-28 ENCOUNTER — Non-Acute Institutional Stay (SKILLED_NURSING_FACILITY): Payer: PRIVATE HEALTH INSURANCE | Admitting: Internal Medicine

## 2013-03-28 DIAGNOSIS — K7460 Unspecified cirrhosis of liver: Secondary | ICD-10-CM

## 2013-03-28 DIAGNOSIS — I10 Essential (primary) hypertension: Secondary | ICD-10-CM

## 2013-03-28 NOTE — Progress Notes (Signed)
Patient ID: [name], female   DOB: 1925-11-13, 79 y.o.   MRN: [id number]    Facility; [facility]. Chief complaint low back pain. Review of general medical issues/routine [facility] note for November History patient has been in the building since June of 2012. She has a history of altered mental status and accelerated hypertension. According to the patient and her family and accelerated hypertension. Has been episodic and in the past is made me wonder about whether she has secondary hypertension or not. In any case, most of her record her blood pressures seem to be stable in the 110 to 120 systolic. She has been no particular issues with regards to this in quite some time now. Recent lab work in mid-May 6, showed a BUN of 12, creatinine 0.7.  Her back pain 3 months resolved with simple analgesics and skeletal muscle relaxants.   I have done a review of EPIC. I do not see anything that documents cirrhosis of the liver. She had an ultrasound of the abdomen from 2012 which I have included below. Liver function tests have not been abnormal. This diagnosis does not seem to be well documented  There does not seem to have been any specific issues this month once again blood pressure has been stable    Past Medical History  Diagnosis Date  . Chronic back pain   . Obstructive chronic bronchitis   . Muscle weakness   . Cirrhosis   . Cerebral aneurysm 07/2011    "non ruptured; left side; behind optical nerve"  . H/O hiatal hernia   . Complication of anesthesia     "don't like medicine or to be put to sleep"  . HOH (hard of hearing), right   . Deaf, left   . Hypertension   . Hypertensive encephalopathy   . Pacemaker     'as a child"  . DVT (deep venous thrombosis), right 1970's    "not long after knee OR"  . Emphysema   .             lep apnea     "doesn't wear mask"  . Exertional dyspnea   . Shortness of breath 08/15/11    "& when lying down"  . GERD (gastroesophageal reflux  disease)   . Hepatitis   . Steatohepatitis, non-alcoholic   . Headache   . Migraines   . Scoliosis   . Osteoarthritis of lumbar spine   . History of shingles 05/2010   Past Surgical History  Procedure Laterality Date  . Vaginal hysterectomy  1980's  . Knee arthroscopy  ?1970's    right  . Tympanoplasty  1950's    removed; left  . Myringotomy  08/2011    right  . Dilation and curettage of uterus  1970's    Medication list is reviewed. Social history; the patient is a DO NOT RESUSCITATE.  Review of systems GU no dysuria or hematuria. Abdomen no abdominal pain.  Physical exam; temperature is 97.3 pulse 72 respirations 18 blood pressure 128/64 Respiratory clear entry bilaterally. Cardiac heart sounds are normal. No murmurs 1+ edema is noted in her legs. No evidence of DVT. Abdomen no liver no spleen no tenderness. GU there is no suprapubic or costovertebral tenderness.   Impression/plan #1 hypertension, which has been very refractory in the past seems to be stable. #2 cirrhosis of the liver; I think this diagnosis is probably in error #3 left middle cerebral artery aneurysm. I do not believe this lady is a  candidate for aggressive evaluation of this however meticulous control of her blood pressure is probably in order most recently listed in the record at 110 which is reasonable    Study Result    *RADIOLOGY REPORT*   Clinical Data:  Follow-up aneurysm.  Malignant hypertension. Episode of unresponsiveness this morning.   MRI HEAD WITHOUT CONTRAST MRA HEAD WITHOUT CONTRAST   Technique:  Multiplanar, multiecho pulse sequences of the brain and surrounding structures were obtained without intravenous contrast. Angiographic images of the head were obtained using MRA technique without contrast.   Comparison:  CT head 08/15/2011.  MRI head 04/21/2011.   MRI HEAD   Findings:  No acute infarct, hemorrhage, mass lesion is present. Mild generalized atrophy and white matter  disease is similar to the prior exam.  There is no hemorrhage or mass lesion.  T2 hyperintensity in the basal ganglia is slightly less conspicuous than on the prior exam.  The globes and orbits are intact.  The paranasal sinuses are clear.  There is some fluid in the mastoid air cells, right greater than left.   Chronic opacification of the left sphenoid sinus is again noted. The paranasal sinuses are otherwise clear.  A small amount of fluid is present in the mastoid air cells.  No obstructing nasopharyngeal lesion is evident.   IMPRESSION:   1.  No acute intracranial abnormality or significant interval change. 2.  A stable atrophy and white matter disease. 3.  Persistent T2 hyperintensity within the basal ganglia.  This is likely chronic.   MRA HEAD   Findings: The internal carotid arteries are ectatic in the cavernous segments.  There is mild irregularity of the A1 and M1 segments without significant stenosis.  A 7-8 mm aneurysm of the left MCA bifurcation is stable.  The anterior communicating artery is patent.  Moderate small vessel disease is similar to the prior exam.   The left vertebral artery is the dominant vessel.  Both PICA origins are visualized and within normal limits.  The right posterior cerebral artery is of fetal type.  A small P1 segment is present.  The left posterior cerebral artery originates from the basilar tip.  Segmental attenuation of distal PCA branch vessels is stable.   IMPRESSION:   1.  Stable 8 mm aneurysm of the left MCA bifurcation. 2.  Stable ectasia of the cavernous carotid arteries bilaterally. 3.  Moderate small vessel disease is unchanged.         *RADIOLOGY REPORT*   Clinical Data:  Abdominal pain   ABDOMINAL ULTRASOUND COMPLETE   Comparison:  None.   Findings:   Gallbladder:  No gallstones, gallbladder wall thickening, or pericholecystic fluid.   Common Bile Duct:  Within normal limits in caliber.   Liver: No focal  mass lesion identified.  Within normal limits in parenchymal echogenicity.   IVC:  Appears normal.   Pancreas:  No abnormality identified.   Spleen:  Within normal limits in size and echotexture.   Right kidney:  Normal in size and parenchymal echogenicity.  No evidence of mass or hydronephrosis.   Left kidney:  Normal in size and parenchymal echogenicity.  No evidence of mass or hydronephrosis.   Abdominal Aorta:  No aneurysm identified.   Incidental note is made of a right pleural effusion.   IMPRESSION: Negative abdominal ultrasound.   Original Report Authenticated By: Rosealee Albee

## 2013-04-26 ENCOUNTER — Non-Acute Institutional Stay (SKILLED_NURSING_FACILITY): Payer: PRIVATE HEALTH INSURANCE | Admitting: Internal Medicine

## 2013-04-26 DIAGNOSIS — I10 Essential (primary) hypertension: Secondary | ICD-10-CM

## 2013-04-26 DIAGNOSIS — I729 Aneurysm of unspecified site: Secondary | ICD-10-CM

## 2013-04-26 NOTE — Progress Notes (Signed)
Patient ID: [name], female   DOB: November 17, 1925, 80 y.o.   MRN: [id number]    Facility; [facility]. Chief complaint low back pain. Review of general medical issues/routine [facility] note for December History patient has been in the building since June of 2012. She has a history of altered mental status and accelerated hypertension. According to the patient and her family and accelerated hypertension. Has been episodic and in the past is made me wonder about whether she has secondary hypertension or not. In any case, most of her record her blood pressures seem to be stable in the 118 to 130 systolic. She has been no particular issues with regards to this in quite some time now. Recent lab work in mid-May 6, showed a BUN of 12, creatinine 0.7.  Her back pain 3 months resolved with simple analgesics and skeletal muscle relaxants.   I have done a review of EPIC. I do not see anything that documents cirrhosis of the liver. She had an ultrasound of the abdomen from 2012 which I have included below. Liver function tests have not been abnormal. This diagnosis does not seem to be well documented  There does not seem to have been any specific issues this month once again blood pressure has been stable    Past Medical History  Diagnosis Date  . Chronic back pain   . Obstructive chronic bronchitis   . Muscle weakness   . Cirrhosis   . Cerebral aneurysm 07/2011    "non ruptured; left side; behind optical nerve"  . H/O hiatal hernia   . Complication of anesthesia     "don't like medicine or to be put to sleep"  . HOH (hard of hearing), right   . Deaf, left   . Hypertension   . Hypertensive encephalopathy   . Pacemaker     'as a child"  . DVT (deep venous thrombosis), right 1970's    "not long after knee OR"  . Emphysema   .             lep apnea     "doesn't wear mask"  . Exertional dyspnea   . Shortness of breath 08/15/11    "& when lying down"  . GERD (gastroesophageal reflux  disease)   . Hepatitis   . Steatohepatitis, non-alcoholic   . Headache   . Migraines   . Scoliosis   . Osteoarthritis of lumbar spine   . History of shingles 05/2010   Past Surgical History  Procedure Laterality Date  . Vaginal hysterectomy  1980's  . Knee arthroscopy  ?1970's    right  . Tympanoplasty  1950's    removed; left  . Myringotomy  08/2011    right  . Dilation and curettage of uterus  1970's    Medication list is reviewed. Social history; the patient is a DO NOT RESUSCITATE.  Review of systems weight is 130 pounds GU no dysuria or hematuria. Abdomen no abdominal pain.  Physical exam; temperature is 97.3 pulse 72 respirations 18 blood pressure 128/64 Respiratory clear entry bilaterally. Cardiac heart sounds are normal. No murmurs 1+ edema is noted in her legs. No evidence of DVT. Abdomen no liver no spleen no tenderness. GU there is no suprapubic or costovertebral tenderness.   Impression/plan #1 hypertension, which has been very refractory in the past seems to be stable. #2 cirrhosis of the liver; I think this diagnosis is probably in error. Likely a non alcoholic fatty liver #3 left middle cerebral  artery aneurysm. I do not believe this lady is a candidate for aggressive evaluation of this however meticulous control of her blood pressure is probably in order most recently listed in the record at 110 which is reasonable    Study Result    *RADIOLOGY REPORT*   Clinical Data:  Follow-up aneurysm.  Malignant hypertension. Episode of unresponsiveness this morning.   MRI HEAD WITHOUT CONTRAST MRA HEAD WITHOUT CONTRAST   Technique:  Multiplanar, multiecho pulse sequences of the brain and surrounding structures were obtained without intravenous contrast. Angiographic images of the head were obtained using MRA technique without contrast.   Comparison:  CT head 08/15/2011.  MRI head 04/21/2011.   MRI HEAD   Findings:  No acute infarct, hemorrhage, mass  lesion is present. Mild generalized atrophy and white matter disease is similar to the prior exam.  There is no hemorrhage or mass lesion.  T2 hyperintensity in the basal ganglia is slightly less conspicuous than on the prior exam.  The globes and orbits are intact.  The paranasal sinuses are clear.  There is some fluid in the mastoid air cells, right greater than left.   Chronic opacification of the left sphenoid sinus is again noted. The paranasal sinuses are otherwise clear.  A small amount of fluid is present in the mastoid air cells.  No obstructing nasopharyngeal lesion is evident.   IMPRESSION:   1.  No acute intracranial abnormality or significant interval change. 2.  A stable atrophy and white matter disease. 3.  Persistent T2 hyperintensity within the basal ganglia.  This is likely chronic.   MRA HEAD   Findings: The internal carotid arteries are ectatic in the cavernous segments.  There is mild irregularity of the A1 and M1 segments without significant stenosis.  A 7-8 mm aneurysm of the left MCA bifurcation is stable.  The anterior communicating artery is patent.  Moderate small vessel disease is similar to the prior exam.   The left vertebral artery is the dominant vessel.  Both PICA origins are visualized and within normal limits.  The right posterior cerebral artery is of fetal type.  A small P1 segment is present.  The left posterior cerebral artery originates from the basilar tip.  Segmental attenuation of distal PCA branch vessels is stable.   IMPRESSION:   1.  Stable 8 mm aneurysm of the left MCA bifurcation. 2.  Stable ectasia of the cavernous carotid arteries bilaterally. 3.  Moderate small vessel disease is unchanged.         *RADIOLOGY REPORT*   Clinical Data:  Abdominal pain   ABDOMINAL ULTRASOUND COMPLETE   Comparison:  None.   Findings:   Gallbladder:  No gallstones, gallbladder wall thickening, or pericholecystic fluid.   Common  Bile Duct:  Within normal limits in caliber.   Liver: No focal mass lesion identified.  Within normal limits in parenchymal echogenicity.   IVC:  Appears normal.   Pancreas:  No abnormality identified.   Spleen:  Within normal limits in size and echotexture.   Right kidney:  Normal in size and parenchymal echogenicity.  No evidence of mass or hydronephrosis.   Left kidney:  Normal in size and parenchymal echogenicity.  No evidence of mass or hydronephrosis.   Abdominal Aorta:  No aneurysm identified.   Incidental note is made of a right pleural effusion.   IMPRESSION: Negative abdominal ultrasound.   Original Report Authenticated By: Rosealee AlbeeAYLOR H. STROUD

## 2013-06-03 ENCOUNTER — Non-Acute Institutional Stay (SKILLED_NURSING_FACILITY): Payer: PRIVATE HEALTH INSURANCE | Admitting: Internal Medicine

## 2013-06-03 DIAGNOSIS — I729 Aneurysm of unspecified site: Secondary | ICD-10-CM

## 2013-06-03 DIAGNOSIS — I10 Essential (primary) hypertension: Secondary | ICD-10-CM

## 2013-06-03 NOTE — Progress Notes (Signed)
Patient ID: [name], female   DOB: [birth year], 80 y.o.   MRN: [id number]    Facility; [facility]. Chief complaint; routine optometry visit for January 2014 History patient has been in the building since June of 2012. She has a history of altered mental status and accelerated hypertension. According to the patient and her family and accelerated hypertension. Has been episodic and in the past is made me wonder about whether she has secondary hypertension or not. In any case, most of her record her blood pressures seem to be stable in the 118 to 130 systolic. She has been no particular issues with regards to this in quite some time now. Recent lab work in mid-May 6, showed a BUN of 12, creatinine 0.7.  Her back pain 3 months resolved with simple analgesics and skeletal muscle relaxants.   I have done a review of EPIC. I do not see anything that documents cirrhosis of the liver. She had an ultrasound of the abdomen from 2012 which I have included below. Liver function tests have not been abnormal. This diagnosis does not seem to be well documented  There does not seem to have been any specific issues this month once again blood pressure has been stable    Past Medical History  Diagnosis Date  . Chronic back pain   . Obstructive chronic bronchitis   . Muscle weakness   . Cirrhosis   . Cerebral aneurysm 07/2011    "non ruptured; left side; behind optical nerve"  . H/O hiatal hernia   . Complication of anesthesia     "don't like medicine or to be put to sleep"  . HOH (hard of hearing), right   . Deaf, left   . Hypertension   . Hypertensive encephalopathy   . Pacemaker     'as a child"  . DVT (deep venous thrombosis), right 1970's    "not long after knee OR"  . Emphysema         "doesn't wear mask"  . Exertional dyspnea   . Shortness of breath 08/15/11    "& when lying down"  . GERD (gastroesophageal reflux disease)   . Hepatitis   . Steatohepatitis, non-alcoholic   .  Headache   . Migraines   . Scoliosis   . Osteoarthritis of lumbar spine   . History of shingles 05/2010   Past Surgical History  Procedure Laterality Date  . Vaginal hysterectomy  1980's  . Knee arthroscopy  ?1970's    right  . Tympanoplasty  1950's    removed; left  . Myringotomy  08/2011    right  . Dilation and curettage of uterus  1970's    Medication list is reviewed. Social history; the patient is a DO NOT RESUSCITATE.  Review of systems weight is 130 pounds GU no dysuria or hematuria. Abdomen no abdominal pain.  Physical exam; temperature is 97.3 pulse 72 respirations 18 blood pressure 128/64 Respiratory clear entry bilaterally. Cardiac heart sounds are normal. No murmurs 1+ edema is noted in her legs. No evidence of DVT. Abdomen no liver no spleen no tenderness. GU there is no suprapubic or costovertebral tenderness.   Impression/plan #1 hypertension, which has been very refractory in the past seems to be stable. #2 cirrhosis of the liver; I think this diagnosis is probably in error. Likely a non alcoholic fatty liver #3 left middle cerebral artery aneurysm. I do not believe this lady is a candidate for aggressive evaluation of this however meticulous control  of her blood pressure is probably in order most recently listed in the record at 110 which is reasonable    Study Result    *RADIOLOGY REPORT*   Clinical Data:  Follow-up aneurysm.  Malignant hypertension. Episode of unresponsiveness this morning.   MRI HEAD WITHOUT CONTRAST MRA HEAD WITHOUT CONTRAST   Technique:  Multiplanar, multiecho pulse sequences of the brain and surrounding structures were obtained without intravenous contrast. Angiographic images of the head were obtained using MRA technique without contrast.   Comparison:  CT head 08/15/2011.  MRI head 04/21/2011.   MRI HEAD   Findings:  No acute infarct, hemorrhage, mass lesion is present. Mild generalized atrophy and white matter disease  is similar to the prior exam.  There is no hemorrhage or mass lesion.  T2 hyperintensity in the basal ganglia is slightly less conspicuous than on the prior exam.  The globes and orbits are intact.  The paranasal sinuses are clear.  There is some fluid in the mastoid air cells, right greater than left.   Chronic opacification of the left sphenoid sinus is again noted. The paranasal sinuses are otherwise clear.  A small amount of fluid is present in the mastoid air cells.  No obstructing nasopharyngeal lesion is evident.   IMPRESSION:   1.  No acute intracranial abnormality or significant interval change. 2.  A stable atrophy and white matter disease. 3.  Persistent T2 hyperintensity within the basal ganglia.  This is likely chronic.   MRA HEAD   Findings: The internal carotid arteries are ectatic in the cavernous segments.  There is mild irregularity of the A1 and M1 segments without significant stenosis.  A 7-8 mm aneurysm of the left MCA bifurcation is stable.  The anterior communicating artery is patent.  Moderate small vessel disease is similar to the prior exam.   The left vertebral artery is the dominant vessel.  Both PICA origins are visualized and within normal limits.  The right posterior cerebral artery is of fetal type.  A small P1 segment is present.  The left posterior cerebral artery originates from the basilar tip.  Segmental attenuation of distal PCA branch vessels is stable.   IMPRESSION:   1.  Stable 8 mm aneurysm of the left MCA bifurcation. 2.  Stable ectasia of the cavernous carotid arteries bilaterally. 3.  Moderate small vessel disease is unchanged.         *RADIOLOGY REPORT*   Clinical Data:  Abdominal pain   ABDOMINAL ULTRASOUND COMPLETE   Comparison:  None.   Findings:   Gallbladder:  No gallstones, gallbladder wall thickening, or pericholecystic fluid.   Common Bile Duct:  Within normal limits in caliber.   Liver: No focal mass  lesion identified.  Within normal limits in parenchymal echogenicity.   IVC:  Appears normal.   Pancreas:  No abnormality identified.   Spleen:  Within normal limits in size and echotexture.   Right kidney:  Normal in size and parenchymal echogenicity.  No evidence of mass or hydronephrosis.   Left kidney:  Normal in size and parenchymal echogenicity.  No evidence of mass or hydronephrosis.   Abdominal Aorta:  No aneurysm identified.   Incidental note is made of a right pleural effusion.   IMPRESSION: Negative abdominal ultrasound.   Original Report Authenticated By: Rosealee AlbeeAYLOR H. STROUD

## 2013-07-04 ENCOUNTER — Non-Acute Institutional Stay (SKILLED_NURSING_FACILITY): Payer: PRIVATE HEALTH INSURANCE | Admitting: Internal Medicine

## 2013-07-04 DIAGNOSIS — I729 Aneurysm of unspecified site: Secondary | ICD-10-CM

## 2013-07-04 DIAGNOSIS — K7460 Unspecified cirrhosis of liver: Secondary | ICD-10-CM

## 2013-07-04 DIAGNOSIS — I10 Essential (primary) hypertension: Secondary | ICD-10-CM

## 2013-07-04 NOTE — Progress Notes (Signed)
Patient ID: [name], female   DOB: 07/01/1925, 80 y.o.   MRN: [id number]    Facility; [facility]. Chief complaint; routine optometry visit for February History patient has been in the building since June of 2012. She has a history of altered mental status and accelerated hypertension. According to the patient and her family she had a history of accelerated hypertension. Has been episodic and in the past is made me wonder about whether she has secondary hypertension or not. In any case, most of her record her blood pressures seem to be stable in the 120 to 130 systolic. She has been no particular issues with regards to this in quite some time now. Recent lab work in mid-May 6, showed a BUN of 12, creatinine 0.7.  Her back pain 3 months ao resolved with simple analgesics and skeletal muscle relaxants. She ambulates and transfers independently.   I have done a review of EPIC. I do not see anything that documents cirrhosis of the liver. She had an ultrasound of the abdomen from 2012 which I have included below. Liver function tests have not been abnormal. This diagnosis does not seem to be well documented  There does not seem to have been any specific issues this month once again blood pressure has been stable    Past Medical History  Diagnosis Date  . Chronic back pain   . Obstructive chronic bronchitis   . Muscle weakness   . Cirrhosis   . Cerebral aneurysm 07/2011    "non ruptured; left side; behind optical nerve"  . H/O hiatal hernia   . Complication of anesthesia     "don't like medicine or to be put to sleep"  . HOH (hard of hearing), right   . Deaf, left   . Hypertension   . Hypertensive encephalopathy   . Pacemaker     'as a child"  . DVT (deep venous thrombosis), right 1970's    "not long after knee OR"  . Emphysema         "doesn't wear mask"  . Exertional dyspnea   . Shortness of breath 08/15/11    "& when lying down"  . GERD (gastroesophageal reflux disease)    . Hepatitis   . Steatohepatitis, non-alcoholic   . Headache   . Migraines   . Scoliosis   . Osteoarthritis of lumbar spine   . History of shingles 05/2010   Past Surgical History  Procedure Laterality Date  . Vaginal hysterectomy  1980's  . Knee arthroscopy  ?1970's    right  . Tympanoplasty  1950's    removed; left  . Myringotomy  08/2011    right  . Dilation and curettage of uterus  1970's    Medication list is reviewed. Social history; the patient is a DO NOT RESUSCITATE.  Review of systems weight is 130 pounds GU no dysuria or hematuria. Abdomen no abdominal pain.  Physical exam; temperature is 97.3 pulse 72 respirations 18 blood pressure 128/64 Respiratory clear entry bilaterally. Cardiac heart sounds are normal. No murmurs 1+ edema is noted in her legs. No evidence of DVT. Abdomen no liver no spleen no tenderness. GU there is no suprapubic or costovertebral tenderness.   Impression/plan #1 hypertension, which has been very refractory in the past seems to be stable. #2 cirrhosis of the liver; I think this diagnosis is probably in error. Likely a non alcoholic fatty liver #3 left middle cerebral artery aneurysm. I do not believe this lady is a  candidate for aggressive evaluation of this however meticulous control of her blood pressure is probably in order most recently listed in the record at 120 which is reasonable    Study Result    *RADIOLOGY REPORT*   Clinical Data:  Follow-up aneurysm.  Malignant hypertension. Episode of unresponsiveness this morning.   MRI HEAD WITHOUT CONTRAST MRA HEAD WITHOUT CONTRAST   Technique:  Multiplanar, multiecho pulse sequences of the brain and surrounding structures were obtained without intravenous contrast. Angiographic images of the head were obtained using MRA technique without contrast.   Comparison:  CT head 08/15/2011.  MRI head 04/21/2011.   MRI HEAD   Findings:  No acute infarct, hemorrhage, mass lesion is  present. Mild generalized atrophy and white matter disease is similar to the prior exam.  There is no hemorrhage or mass lesion.  T2 hyperintensity in the basal ganglia is slightly less conspicuous than on the prior exam.  The globes and orbits are intact.  The paranasal sinuses are clear.  There is some fluid in the mastoid air cells, right greater than left.   Chronic opacification of the left sphenoid sinus is again noted. The paranasal sinuses are otherwise clear.  A small amount of fluid is present in the mastoid air cells.  No obstructing nasopharyngeal lesion is evident.   IMPRESSION:   1.  No acute intracranial abnormality or significant interval change. 2.  A stable atrophy and white matter disease. 3.  Persistent T2 hyperintensity within the basal ganglia.  This is likely chronic.   MRA HEAD   Findings: The internal carotid arteries are ectatic in the cavernous segments.  There is mild irregularity of the A1 and M1 segments without significant stenosis.  A 7-8 mm aneurysm of the left MCA bifurcation is stable.  The anterior communicating artery is patent.  Moderate small vessel disease is similar to the prior exam.   The left vertebral artery is the dominant vessel.  Both PICA origins are visualized and within normal limits.  The right posterior cerebral artery is of fetal type.  A small P1 segment is present.  The left posterior cerebral artery originates from the basilar tip.  Segmental attenuation of distal PCA branch vessels is stable.   IMPRESSION:   1.  Stable 8 mm aneurysm of the left MCA bifurcation. 2.  Stable ectasia of the cavernous carotid arteries bilaterally. 3.  Moderate small vessel disease is unchanged.         *RADIOLOGY REPORT*   Clinical Data:  Abdominal pain   ABDOMINAL ULTRASOUND COMPLETE   Comparison:  None.   Findings:   Gallbladder:  No gallstones, gallbladder wall thickening, or pericholecystic fluid.   Common Bile Duct:   Within normal limits in caliber.   Liver: No focal mass lesion identified.  Within normal limits in parenchymal echogenicity.   IVC:  Appears normal.   Pancreas:  No abnormality identified.   Spleen:  Within normal limits in size and echotexture.   Right kidney:  Normal in size and parenchymal echogenicity.  No evidence of mass or hydronephrosis.   Left kidney:  Normal in size and parenchymal echogenicity.  No evidence of mass or hydronephrosis.   Abdominal Aorta:  No aneurysm identified.   Incidental note is made of a right pleural effusion.   IMPRESSION: Negative abdominal ultrasound.   Original Report Authenticated By: Rosealee AlbeeAYLOR H. STROUD

## 2013-07-31 ENCOUNTER — Non-Acute Institutional Stay (SKILLED_NURSING_FACILITY): Payer: PRIVATE HEALTH INSURANCE | Admitting: Internal Medicine

## 2013-07-31 DIAGNOSIS — I671 Cerebral aneurysm, nonruptured: Secondary | ICD-10-CM

## 2013-07-31 NOTE — Progress Notes (Signed)
Patient ID: [name], female   DOB: [birth year], 80 y.o.   MRN: [id number]     Facility; [facility]. Chief complaint; routine optometry visit for March History patient has been in the building since June of 2012. She has a history of altered mental status and accelerated hypertension. According to the patient and her family she had a history of accelerated hypertension. Has been episodic and in the past is made me wonder about whether she has secondary hypertension or not. In any case, most of her record her blood pressures seem to be stable in the 120 to 130 systolic. She has been no particular issues with regards to this in quite some time now. Recent lab work in Dec 14 showed a normal CBC and comprehensive metabolic panel. Her TSH was checked on 2/4 at 1.190  Her back pain 4 months ao resolved with simple analgesics and skeletal muscle relaxants. She ambulates and transfers independently.   I have done a review of EPIC. I do not see anything that documents cirrhosis of the liver. She had an ultrasound of the abdomen from 2012 which I have included below. Liver function tests have not been abnormal. This diagnosis does not seem to be well documented  There does not seem to have been any specific issues this month once again blood pressure has been stable    Past Medical History  Diagnosis Date  . Chronic back pain   . Obstructive chronic bronchitis   . Muscle weakness   . Cirrhosis   . Cerebral aneurysm 07/2011    "non ruptured; left side; behind optical nerve"  . H/O hiatal hernia   . Complication of anesthesia     "don't like medicine or to be put to sleep"  . HOH (hard of hearing), right   . Deaf, left   . Hypertension   . Hypertensive encephalopathy   . Pacemaker     'as a child"  . DVT (deep venous thrombosis), right 1970's    "not long after knee OR"  . Emphysema         "doesn't wear mask"  . Exertional dyspnea   . Shortness of breath 08/15/11    "& when lying  down"  . GERD (gastroesophageal reflux disease)   . Hepatitis   . Steatohepatitis, non-alcoholic   . Headache   . Migraines   . Scoliosis   . Osteoarthritis of lumbar spine   . History of shingles 05/2010   Past Surgical History  Procedure Laterality Date  . Vaginal hysterectomy  1980's  . Knee arthroscopy  ?1970's    right  . Tympanoplasty  1950's    removed; left  . Myringotomy  08/2011    right  . Dilation and curettage of uterus  1970's    Medication list is reviewed. ASA 81 daily Inderal LA 160 daily Norvasc 5 mg daily Centrum Silver one tablet daily Prinivil 30 mg daily Colace 100 twice a day Pepcid 20 twice a day Terazosin 3 mg daily Os-Cal plus D. twice a day Neurontin 700 every night at hs Social history; the patient is a DO NOT RESUSCITATE.  Review of systems weight is 130 pounds GU no dysuria or hematuria. Abdomen no abdominal pain.  Physical exam; temperature 97.1-pulse 74-respirations 18-blood pressure 120/70-weight 132 pounds-O2 sat 96% on room Respiratory clear entry bilaterally. Cardiac heart sounds are normal. No murmurs 1+ edema is noted in her legs. No evidence of DVT. Abdomen no liver no spleen no tenderness.  GU there is no suprapubic or costovertebral tenderness.   Impression/plan #1 hypertension, which has been very refractory in the past seems to be stable albeit on a multitude of medications #2 cirrhosis of the liver; I think this diagnosis is probably in error. Likely a non alcoholic fatty liver #3 left middle cerebral artery aneurysm. I do not believe this lady is a candidate for aggressive evaluation of this however meticulous control of her blood pressure is probably in order most recently listed in the record at 120 which is reasonable    Study Result    *RADIOLOGY REPORT*   Clinical Data:  Follow-up aneurysm.  Malignant hypertension. Episode of unresponsiveness this morning.   MRI HEAD WITHOUT CONTRAST MRA HEAD WITHOUT CONTRAST    Technique:  Multiplanar, multiecho pulse sequences of the brain and surrounding structures were obtained without intravenous contrast. Angiographic images of the head were obtained using MRA technique without contrast.   Comparison:  CT head 08/15/2011.  MRI head 04/21/2011.   MRI HEAD   Findings:  No acute infarct, hemorrhage, mass lesion is present. Mild generalized atrophy and white matter disease is similar to the prior exam.  There is no hemorrhage or mass lesion.  T2 hyperintensity in the basal ganglia is slightly less conspicuous than on the prior exam.  The globes and orbits are intact.  The paranasal sinuses are clear.  There is some fluid in the mastoid air cells, right greater than left.   Chronic opacification of the left sphenoid sinus is again noted. The paranasal sinuses are otherwise clear.  A small amount of fluid is present in the mastoid air cells.  No obstructing nasopharyngeal lesion is evident.   IMPRESSION:   1.  No acute intracranial abnormality or significant interval change. 2.  A stable atrophy and white matter disease. 3.  Persistent T2 hyperintensity within the basal ganglia.  This is likely chronic.   MRA HEAD   Findings: The internal carotid arteries are ectatic in the cavernous segments.  There is mild irregularity of the A1 and M1 segments without significant stenosis.  A 7-8 mm aneurysm of the left MCA bifurcation is stable.  The anterior communicating artery is patent.  Moderate small vessel disease is similar to the prior exam.   The left vertebral artery is the dominant vessel.  Both PICA origins are visualized and within normal limits.  The right posterior cerebral artery is of fetal type.  A small P1 segment is present.  The left posterior cerebral artery originates from the basilar tip.  Segmental attenuation of distal PCA branch vessels is stable.   IMPRESSION:   1.  Stable 8 mm aneurysm of the left MCA bifurcation. 2.   Stable ectasia of the cavernous carotid arteries bilaterally. 3.  Moderate small vessel disease is unchanged.         *RADIOLOGY REPORT*   Clinical Data:  Abdominal pain   ABDOMINAL ULTRASOUND COMPLETE   Comparison:  None.   Findings:   Gallbladder:  No gallstones, gallbladder wall thickening, or pericholecystic fluid.   Common Bile Duct:  Within normal limits in caliber.   Liver: No focal mass lesion identified.  Within normal limits in parenchymal echogenicity.   IVC:  Appears normal.   Pancreas:  No abnormality identified.   Spleen:  Within normal limits in size and echotexture.   Right kidney:  Normal in size and parenchymal echogenicity.  No evidence of mass or hydronephrosis.   Left kidney:  Normal in size and parenchymal  echogenicity.  No evidence of mass or hydronephrosis.   Abdominal Aorta:  No aneurysm identified.   Incidental note is made of a right pleural effusion.   IMPRESSION: Negative abdominal ultrasound.   Original Report Authenticated By: Rosealee AlbeeAYLOR H. STROUD

## 2013-08-04 ENCOUNTER — Emergency Department (HOSPITAL_COMMUNITY): Payer: PRIVATE HEALTH INSURANCE

## 2013-08-04 ENCOUNTER — Encounter (HOSPITAL_COMMUNITY): Payer: Self-pay | Admitting: Emergency Medicine

## 2013-08-04 ENCOUNTER — Observation Stay (HOSPITAL_COMMUNITY)
Admission: EM | Admit: 2013-08-04 | Discharge: 2013-08-06 | Disposition: A | Discharge status: Home / Self Care | Payer: PRIVATE HEALTH INSURANCE | Attending: Internal Medicine | Admitting: Internal Medicine

## 2013-08-04 DIAGNOSIS — Z79899 Other long term (current) drug therapy: Secondary | ICD-10-CM | POA: Diagnosis not present

## 2013-08-04 DIAGNOSIS — J438 Other emphysema: Secondary | ICD-10-CM | POA: Insufficient documentation

## 2013-08-04 DIAGNOSIS — K7689 Other specified diseases of liver: Secondary | ICD-10-CM | POA: Insufficient documentation

## 2013-08-04 DIAGNOSIS — I10 Essential (primary) hypertension: Secondary | ICD-10-CM | POA: Diagnosis not present

## 2013-08-04 DIAGNOSIS — M47817 Spondylosis without myelopathy or radiculopathy, lumbosacral region: Secondary | ICD-10-CM | POA: Insufficient documentation

## 2013-08-04 DIAGNOSIS — I674 Hypertensive encephalopathy: Secondary | ICD-10-CM | POA: Diagnosis not present

## 2013-08-04 DIAGNOSIS — G4730 Sleep apnea, unspecified: Secondary | ICD-10-CM | POA: Insufficient documentation

## 2013-08-04 DIAGNOSIS — Z86718 Personal history of other venous thrombosis and embolism: Secondary | ICD-10-CM | POA: Diagnosis not present

## 2013-08-04 DIAGNOSIS — Z8619 Personal history of other infectious and parasitic diseases: Secondary | ICD-10-CM | POA: Diagnosis not present

## 2013-08-04 DIAGNOSIS — K219 Gastro-esophageal reflux disease without esophagitis: Secondary | ICD-10-CM | POA: Diagnosis not present

## 2013-08-04 DIAGNOSIS — M4120 Other idiopathic scoliosis, site unspecified: Secondary | ICD-10-CM | POA: Diagnosis not present

## 2013-08-04 DIAGNOSIS — Z880 Allergy status to penicillin: Secondary | ICD-10-CM | POA: Diagnosis not present

## 2013-08-04 DIAGNOSIS — Z950 Presence of cardiac pacemaker: Secondary | ICD-10-CM | POA: Diagnosis not present

## 2013-08-04 DIAGNOSIS — H9190 Unspecified hearing loss, unspecified ear: Secondary | ICD-10-CM | POA: Insufficient documentation

## 2013-08-04 DIAGNOSIS — R112 Nausea with vomiting, unspecified: Secondary | ICD-10-CM | POA: Diagnosis not present

## 2013-08-04 DIAGNOSIS — R079 Chest pain, unspecified: Secondary | ICD-10-CM | POA: Diagnosis present

## 2013-08-04 DIAGNOSIS — I671 Cerebral aneurysm, nonruptured: Secondary | ICD-10-CM | POA: Insufficient documentation

## 2013-08-04 DIAGNOSIS — R0789 Other chest pain: Secondary | ICD-10-CM | POA: Diagnosis not present

## 2013-08-04 DIAGNOSIS — G8929 Other chronic pain: Secondary | ICD-10-CM | POA: Diagnosis not present

## 2013-08-04 DIAGNOSIS — K759 Inflammatory liver disease, unspecified: Secondary | ICD-10-CM | POA: Insufficient documentation

## 2013-08-04 DIAGNOSIS — G43909 Migraine, unspecified, not intractable, without status migrainosus: Secondary | ICD-10-CM | POA: Diagnosis not present

## 2013-08-04 DIAGNOSIS — R5383 Other fatigue: Secondary | ICD-10-CM

## 2013-08-04 DIAGNOSIS — J449 Chronic obstructive pulmonary disease, unspecified: Secondary | ICD-10-CM

## 2013-08-04 DIAGNOSIS — K7460 Unspecified cirrhosis of liver: Secondary | ICD-10-CM | POA: Diagnosis not present

## 2013-08-04 DIAGNOSIS — Z7982 Long term (current) use of aspirin: Secondary | ICD-10-CM | POA: Diagnosis not present

## 2013-08-04 DIAGNOSIS — R5381 Other malaise: Secondary | ICD-10-CM | POA: Insufficient documentation

## 2013-08-04 LAB — TROPONIN I: Troponin I: <0.3 ng/mL (ref <0.30)

## 2013-08-04 LAB — CBC
HCT: 40.3 % (ref 36.0–46.0)
Hemoglobin: 13 g/dL (ref 12.0–15.0)
MCH: 28.4 pg (ref 26.0–34.0)
MCHC: 32.3 g/dL (ref 30.0–36.0)
MCV: 88 fL (ref 78.0–100.0)
PLATELETS: 198 10*3/uL (ref 150–400)
RBC: 4.58 MIL/uL (ref 3.87–5.11)
RDW: 14.1 % (ref 11.5–15.5)
WBC: 7 10*3/uL (ref 4.0–10.5)

## 2013-08-04 LAB — LIPASE, BLOOD: Lipase: 35 U/L (ref 11–59)

## 2013-08-04 LAB — HEPATIC FUNCTION PANEL
ALBUMIN: 3.6 g/dL (ref 3.5–5.2)
ALT: 9 U/L (ref 0–35)
AST: 18 U/L (ref 0–37)
Alkaline Phosphatase: 93 U/L (ref 39–117)
Bilirubin, Direct: <0.2 mg/dL (ref 0.0–0.3)
TOTAL PROTEIN: 7.4 g/dL (ref 6.0–8.3)
Total Bilirubin: 0.7 mg/dL (ref 0.3–1.2)

## 2013-08-04 LAB — BASIC METABOLIC PANEL
BUN: 16 mg/dL (ref 6–23)
CHLORIDE: 100 meq/L (ref 96–112)
CO2: 27 meq/L (ref 19–32)
Calcium: 9.2 mg/dL (ref 8.4–10.5)
Creatinine, Ser: 0.62 mg/dL (ref 0.50–1.10)
GFR calc non Af Amer: 80 mL/min — ABNORMAL LOW (ref >90)
Glucose, Bld: 110 mg/dL — ABNORMAL HIGH (ref 70–99)
POTASSIUM: 4.2 meq/L (ref 3.7–5.3)
Sodium: 141 mEq/L (ref 137–147)

## 2013-08-04 MED ORDER — DOCUSATE SODIUM 100 MG PO CAPS
100.0000 mg | ORAL_CAPSULE | Freq: Two times a day (BID) | ORAL | Status: DC
  Administered 2013-08-04 – 2013-08-06 (×4): 100 mg via ORAL
  Filled 2013-08-04 (×5): qty 1

## 2013-08-04 MED ORDER — ADULT MULTIVITAMIN W/MINERALS CH
1.0000 | ORAL_TABLET | Freq: Every day | ORAL | Status: DC
  Administered 2013-08-05 – 2013-08-06 (×2): 1 via ORAL
  Filled 2013-08-04 (×2): qty 1

## 2013-08-04 MED ORDER — PRAZOSIN HCL 2 MG PO CAPS: 2.0000 mg | ORAL_CAPSULE | Freq: Two times a day (BID) | ORAL | Status: DC

## 2013-08-04 MED ORDER — LISINOPRIL 20 MG PO TABS
30.0000 mg | ORAL_TABLET | Freq: Every day | ORAL | Status: DC
  Administered 2013-08-05 – 2013-08-06 (×2): 30 mg via ORAL
  Filled 2013-08-04 (×2): qty 1

## 2013-08-04 MED ORDER — PROPRANOLOL HCL ER 160 MG PO CP24
160.0000 mg | ORAL_CAPSULE | Freq: Every day | ORAL | Status: DC
  Administered 2013-08-05: 160 mg via ORAL
  Filled 2013-08-04 (×3): qty 1

## 2013-08-04 MED ORDER — ADULT MULTIVITAMIN W/MINERALS CH: 1.0000 | ORAL_TABLET | Freq: Every day | ORAL | Status: DC

## 2013-08-04 MED ORDER — PRAZOSIN HCL 1 MG PO CAPS
1.0000 mg | ORAL_CAPSULE | Freq: Two times a day (BID) | ORAL | Status: DC
Start: 2013-08-04 — End: 2013-08-04

## 2013-08-04 MED ORDER — ASPIRIN 81 MG PO CHEW: 81.0000 mg | CHEWABLE_TABLET | Freq: Every day | ORAL | Status: DC

## 2013-08-04 MED ORDER — AMLODIPINE BESYLATE 5 MG PO TABS
5.0000 mg | ORAL_TABLET | Freq: Every day | ORAL | Status: DC
  Administered 2013-08-05 – 2013-08-06 (×2): 5 mg via ORAL
  Filled 2013-08-04 (×2): qty 1

## 2013-08-04 MED ORDER — ASPIRIN 81 MG PO CHEW
81.0000 mg | CHEWABLE_TABLET | Freq: Every day | ORAL | Status: DC
  Administered 2013-08-05 – 2013-08-06 (×2): 81 mg via ORAL
  Filled 2013-08-04 (×2): qty 1

## 2013-08-04 MED ORDER — IBUPROFEN 200 MG PO TABS
200.0000 mg | ORAL_TABLET | Freq: Two times a day (BID) | ORAL | Status: DC | PRN
  Filled 2013-08-04: qty 1

## 2013-08-04 MED ORDER — HYDRALAZINE HCL 20 MG/ML IJ SOLN: 10.0000 mg | INTRAMUSCULAR | Status: DC | PRN

## 2013-08-04 MED ORDER — GABAPENTIN 400 MG PO CAPS
400.0000 mg | ORAL_CAPSULE | Freq: Every day | ORAL | Status: DC
  Administered 2013-08-04 – 2013-08-05 (×2): 400 mg via ORAL
  Filled 2013-08-04 (×3): qty 1

## 2013-08-04 MED ORDER — NITROGLYCERIN 0.4 MG SL SUBL
0.4000 mg | SUBLINGUAL_TABLET | SUBLINGUAL | Status: DC | PRN
Start: 2013-08-04 — End: 2013-08-06
  Filled 2013-08-04 (×2): qty 1

## 2013-08-04 MED ORDER — PRAZOSIN HCL 1 MG PO CAPS
1.0000 mg | ORAL_CAPSULE | Freq: Two times a day (BID) | ORAL | Status: DC
  Administered 2013-08-04 – 2013-08-06 (×4): 1 mg via ORAL
  Filled 2013-08-04 (×5): qty 1

## 2013-08-04 MED ORDER — LORATADINE 10 MG PO TABS
10.0000 mg | ORAL_TABLET | Freq: Every day | ORAL | Status: DC | PRN
  Filled 2013-08-04: qty 1

## 2013-08-04 MED ORDER — HEPARIN SODIUM (PORCINE) 5000 UNIT/ML IJ SOLN
5000.0000 [IU] | Freq: Three times a day (TID) | INTRAMUSCULAR | Status: DC
  Administered 2013-08-04 – 2013-08-06 (×5): 5000 [IU] via SUBCUTANEOUS
  Filled 2013-08-04 (×8): qty 1

## 2013-08-04 MED ORDER — PANTOPRAZOLE SODIUM 40 MG PO TBEC
40.0000 mg | DELAYED_RELEASE_TABLET | Freq: Every day | ORAL | Status: DC
  Administered 2013-08-05: 40 mg via ORAL
  Filled 2013-08-04: qty 1

## 2013-08-04 MED ORDER — GABAPENTIN 400 MG PO CAPS: 400.0000 mg | ORAL_CAPSULE | Freq: Every day | ORAL | Status: DC

## 2013-08-04 MED ORDER — PRAZOSIN HCL 2 MG PO CAPS
2.0000 mg | ORAL_CAPSULE | Freq: Two times a day (BID) | ORAL | Status: DC
  Administered 2013-08-04 – 2013-08-06 (×4): 2 mg via ORAL
  Filled 2013-08-04 (×5): qty 1

## 2013-08-04 MED ORDER — LISINOPRIL 20 MG PO TABS: 30.0000 mg | ORAL_TABLET | Freq: Every day | ORAL | Status: DC

## 2013-08-04 MED ORDER — ONDANSETRON HCL 4 MG/2ML IJ SOLN
4.0000 mg | Freq: Four times a day (QID) | INTRAMUSCULAR | Status: DC | PRN
  Administered 2013-08-05 (×2): 4 mg via INTRAVENOUS
  Filled 2013-08-04: qty 2

## 2013-08-04 MED ORDER — IPRATROPIUM-ALBUTEROL 0.5-2.5 (3) MG/3ML IN SOLN: 3.0000 mL | Freq: Four times a day (QID) | RESPIRATORY_TRACT | Status: DC | PRN

## 2013-08-04 MED ORDER — PANTOPRAZOLE SODIUM 40 MG PO TBEC: 40.0000 mg | DELAYED_RELEASE_TABLET | Freq: Every day | ORAL | Status: DC

## 2013-08-04 MED ORDER — GABAPENTIN 300 MG PO CAPS
300.0000 mg | ORAL_CAPSULE | Freq: Every day | ORAL | Status: DC
  Filled 2013-08-04: qty 1

## 2013-08-04 MED ORDER — GABAPENTIN 300 MG PO CAPS
300.0000 mg | ORAL_CAPSULE | Freq: Every day | ORAL | Status: DC
  Administered 2013-08-04 – 2013-08-05 (×2): 300 mg via ORAL
  Filled 2013-08-04 (×3): qty 1

## 2013-08-04 NOTE — ED Notes (Signed)
MD at bedside. 

## 2013-08-04 NOTE — ED Notes (Signed)
Unable to obtain EKG due to computer unable to read. Switching out cords at this time.

## 2013-08-04 NOTE — H&P (Addendum)
[facility] History and Physical  [name] ZOX:[id number] DOB: [birth year] DOA: 08/04/2013  Referring physician: EDP PCP: Terald SleeperOBSON,MICHAEL GAVIN, Brewer   Chief Complaint: Chest pain   HPI: [name] is a 80 y.o. female who presents to the ED with c/o intermittent chest pain over the past 2-3 days.  Symptoms have been worsening.  Chest pain is substernal, sharp, and intermittent.  There is a clear association with HTN noted at the nursing home, typically during chest pain episodes her BP will be running over 200 systolic.  Her chest pain is rapidly relieved and her BP rapidly reduced when she is given NTG and clonadine which she takes PRN.  Today the same thing happened, she developed CP, SBP noted to be 210, given clonadine and NTG, BP came down to the 160s and CP resolved.  Review of Systems: Systems reviewed.  As above, otherwise negative  Past Medical History  Diagnosis Date  . Chronic back pain   . Obstructive chronic bronchitis   . Muscle weakness   . Cirrhosis   . Cerebral aneurysm 07/2011    "non ruptured; left side; behind optical nerve"  . H/O hiatal hernia   . Complication of anesthesia     "don't like medicine or to be put to sleep"  . HOH (hard of hearing), right   . Deaf, left   . Hypertension   . Hypertensive encephalopathy   . Pacemaker     'as a child"  . DVT (deep venous thrombosis), right 1970's    "not long after knee OR"  . Emphysema   . Sleep apnea     "doesn't wear mask"  . Exertional dyspnea   . Shortness of breath 08/15/11    "& when lying down"  . GERD (gastroesophageal reflux disease)   . Hepatitis   . Steatohepatitis, non-alcoholic   . Headache(784.0)   . Migraines   . Scoliosis   . Osteoarthritis of lumbar spine   . History of shingles 05/2010   Past Surgical History  Procedure Laterality Date  . Vaginal hysterectomy  1980's  . Knee arthroscopy  ?1970's    right  . Tympanoplasty  1950's    removed; left  . Myringotomy  08/2011     right  . Dilation and curettage of uterus  1970's   Social History:  reports that she has never smoked. She has never used smokeless tobacco. She reports that she does not drink alcohol or use illicit drugs.  Allergies  Allergen Reactions  . Acetaminophen Nausea And Vomiting  . Penicillins Anaphylaxis  . Vicodin [Hydrocodone-Acetaminophen] Nausea And Vomiting    No family history on file.   Prior to Admission medications   Medication Sig Start Date End Date Taking? Authorizing Provider  amLODipine (NORVASC) 5 MG tablet Take 5 mg by mouth daily.   Yes Historical Provider, Brewer  aspirin 81 MG chewable tablet Chew 81 mg by mouth daily.   Yes Historical Provider, Brewer  calcium carbonate (TUMS - DOSED IN MG ELEMENTAL CALCIUM) 500 MG chewable tablet Chew 1 tablet by mouth every 4 (four) hours as needed for indigestion or heartburn.   Yes Historical Provider, Brewer  calcium-vitamin D (OSCAL WITH D) 500-200 MG-UNIT per tablet Take 1 tablet by mouth 2 (two) times daily.   Yes Historical Provider, Brewer  docusate sodium (COLACE) 100 MG capsule Take 100 mg by mouth 2 (two) times daily.   Yes Historical Provider, Brewer  gabapentin (NEURONTIN) 300 MG capsule Take 300 mg  by mouth at bedtime. Takes along with 400mg  to =700mg    Yes Historical Provider, Brewer  gabapentin (NEURONTIN) 400 MG capsule Take 400 mg by mouth at bedtime. Take with 300 mg cap to equal 700 mg   Yes Historical Provider, Brewer  ibuprofen (ADVIL,MOTRIN) 200 MG tablet Take 200 mg by mouth 2 (two) times daily as needed for mild pain. For pain    Yes Historical Provider, Brewer  ipratropium-albuterol (DUONEB) 0.5-2.5 (3) MG/3ML SOLN Take 3 mLs by nebulization every 6 (six) hours as needed (for wheezing).   Yes Historical Provider, Brewer  lisinopril (PRINIVIL,ZESTRIL) 30 MG tablet Take 30 mg by mouth daily.   Yes Historical Provider, Brewer  loratadine (CLARITIN) 10 MG tablet Take 10 mg by mouth daily as needed for allergies.   Yes Historical Provider, Brewer   Multiple Vitamin (MULITIVITAMIN WITH MINERALS) TABS Take 1 tablet by mouth daily.   Yes Historical Provider, Brewer  pantoprazole (PROTONIX) 40 MG tablet Take 40 mg by mouth daily.   Yes Historical Provider, Brewer  prazosin (MINIPRESS) 1 MG capsule Take 1 mg by mouth 2 (two) times daily. Take 2 mg capsule along with 1 mg capsule for total of 3 mg twice daily   Yes Historical Provider, Brewer  prazosin (MINIPRESS) 2 MG capsule Take 2 mg by mouth 2 (two) times daily. Take 2 mg capsule along with 1 mg capsule for total of 3 mg twice daily   Yes Historical Provider, Brewer  propranolol (INDERAL LA) 160 MG SR capsule Take 160 mg by mouth daily.   Yes Historical Provider, Brewer  cloNIDine (CATAPRES) 0.1 MG tablet Take 0.1 mg by mouth every 6 (six) hours as needed. For systolic BP greater than or equal to 170    Historical Provider, Brewer   Physical Exam: Filed Vitals:   08/04/13 1914  BP: 144/66  Pulse:   Temp:   Resp: 18    BP 144/66  Pulse 84  Temp(Src) 97.9 F (36.6 C) (Oral)  Resp 18  SpO2 99%  General Appearance:    Alert, oriented, no distress, appears stated age  Head:    Normocephalic, atraumatic  Eyes:    PERRL, EOMI, sclera non-icteric        Nose:   Nares without drainage or epistaxis. Mucosa, turbinates normal  Throat:   Moist mucous membranes. Oropharynx without erythema or exudate.  Neck:   Supple. No carotid bruits.  No thyromegaly.  No lymphadenopathy.   Back:     No CVA tenderness, no spinal tenderness  Lungs:     Clear to auscultation bilaterally, without wheezes, rhonchi or rales  Chest wall:    No tenderness to palpitation  Heart:    Regular rate and rhythm without murmurs, gallops, rubs  Abdomen:     Soft, non-tender, nondistended, normal bowel sounds, no organomegaly  Genitalia:    deferred  Rectal:    deferred  Extremities:   No clubbing, cyanosis or edema.  Pulses:   2+ and symmetric all extremities  Skin:   Skin color, texture, turgor normal, no rashes or lesions  Lymph nodes:    Cervical, supraclavicular, and axillary nodes normal  Neurologic:   CNII-XII intact. Normal strength, sensation and reflexes      throughout    Labs on Admission:  Basic Metabolic Panel:  Recent Labs Lab 08/04/13 1649  NA 141  K 4.2  CL 100  CO2 27  GLUCOSE 110*  BUN 16  CREATININE 0.62  CALCIUM 9.2   Liver Function  Tests:  Recent Labs Lab 08/04/13 1649  AST 18  ALT 9  ALKPHOS 93  BILITOT 0.7  PROT 7.4  ALBUMIN 3.6    Recent Labs Lab 08/04/13 1649  LIPASE 35   No results found for this basename: AMMONIA,  in the last 168 hours CBC:  Recent Labs Lab 08/04/13 1649  WBC 7.0  HGB 13.0  HCT 40.3  MCV 88.0  PLT 198   Cardiac Enzymes:  Recent Labs Lab 08/04/13 1623  TROPONINI <0.30    BNP (last 3 results) No results found for this basename: PROBNP,  in the last 8760 hours CBG: No results found for this basename: GLUCAP,  in the last 168 hours  Radiological Exams on Admission: Dg Chest 2 View  08/04/2013   CLINICAL DATA:  Chest pain and weakness.  EXAM: CHEST  2 VIEW  COMPARISON:  08/15/2011.  FINDINGS: Trachea is midline. Heart size stable. Thoracic aorta is calcified. Biapical pleural parenchymal scarring. There may be mild scarring at the left lung base. Lungs are otherwise clear. No pleural fluid.  IMPRESSION: No acute findings.   Electronically Signed   By: Leanna BattlesMelinda  Blietz M.D.   On: 08/04/2013 15:06    EKG: Independently reviewed.  Patient does appear to have non-specific ST segment changes in the lateral leads which were not present in 2013.  Assessment/Plan Principal Problem:   Chest pain Active Problems:   Hypertension, accelerated   1. Chest pain - due to malignant HTN vs crescendo angina.  HEART score is 6, but there does seem to be a fairly clear association with her BP.  Additionally it is very important to keep her BP controlled due to presence of an unruptured cerebral aneurysm.  Serial trops and tele monitor ordered, continue home  BP meds, add hydralazine PRN and hold off on the PRN clonidine due to risk of rebound HTN with this.  Goal BP 120-170 systolic.  Consider cards eval in AM to see if she needs a stress test as her HEART score is still technically a 6 and I cant really rule out concurrent crescendo angina on the basis of an association with HTN alone.    Code Status: DNR, patient has signed form with her Family Communication: Family at bedside Disposition Plan: Admit to obs   Time spent: 70 min  Hillary BowJared M Gardner [facility] Pager [phone]  If 7AM-7PM, please contact the day team taking care of the patient [email or web address] Password [email or web address] 08/04/2013, 7:41 PM

## 2013-08-04 NOTE — ED Notes (Signed)
Lab called to add on  

## 2013-08-04 NOTE — ED Notes (Signed)
Per ems, pt from [facility]. Call was for CP, off and on for a couple of weeks. EMS arrived on scene pt was pain free. Pt has epigastric hernia. Facility given 1 nitro, 1 klonadine. Initially hypertensive 160/120. 12 lead unremarkable, showed NSR. Pt in room in ED now complaining of CP. Pt also c/o weakness.

## 2013-08-04 NOTE — ED Provider Notes (Signed)
CSN: [id number]     Arrival date & time 08/04/13  1341 History   First MD Initiated Contact with Patient 08/04/13 1513     Chief Complaint  Patient presents with  . Chest Pain  . Weakness     (Consider location/radiation/quality/duration/timing/severity/associated sxs/prior Treatment) HPI Pt presenting with c/o chest pain intermittently over the past several weeks.  Symptoms have been increasing over the past 2-3 days.  Chest pain is substernal, sharp and intermittent.  Associated today with nausea and vomiting and hypertension.  She also c/o feeling increased generalized weakness and fatigue.  Pt was given nitroglycerin and clonidine which relieved her pain and improved her blood pressure. Currently she is chest pain free.  There are no other associated systemic symptoms, there are no other alleviating or modifying factors.   Past Medical History  Diagnosis Date  . Chronic back pain   . Obstructive chronic bronchitis   . Muscle weakness   . Cirrhosis   . Cerebral aneurysm 07/2011    "non ruptured; left side; behind optical nerve"  . H/O hiatal hernia   . Complication of anesthesia     "don't like medicine or to be put to sleep"  . HOH (hard of hearing), right   . Deaf, left   . Hypertension   . Hypertensive encephalopathy   . Pacemaker     'as a child"  . DVT (deep venous thrombosis), right 1970's    "not long after knee OR"  . Emphysema   . Sleep apnea     "doesn't wear mask"  . Exertional dyspnea   . Shortness of breath 08/15/11    "& when lying down"  . GERD (gastroesophageal reflux disease)   . Hepatitis   . Steatohepatitis, non-alcoholic   . Headache(784.0)   . Migraines   . Scoliosis   . Osteoarthritis of lumbar spine   . History of shingles 05/2010   Past Surgical History  Procedure Laterality Date  . Vaginal hysterectomy  1980's  . Knee arthroscopy  ?1970's    right  . Tympanoplasty  1950's    removed; left  . Myringotomy  08/2011    right  . Dilation and  curettage of uterus  1970's   No family history on file. History  Substance Use Topics  . Smoking status: Never Smoker   . Smokeless tobacco: Never Used  . Alcohol Use: No   OB History   Grav Para Term Preterm Abortions TAB SAB Ect Mult Living                 Review of Systems ROS reviewed and all otherwise negative except for mentioned in HPI    Allergies  Acetaminophen; Penicillins; and Vicodin  Home Medications   Prior to Admission medications   Medication Sig Start Date End Date Taking? Authorizing Provider  amLODipine (NORVASC) 5 MG tablet Take 5 mg by mouth daily.   Yes Historical Provider, MD  aspirin 81 MG chewable tablet Chew 81 mg by mouth daily.   Yes Historical Provider, MD  calcium carbonate (TUMS - DOSED IN MG ELEMENTAL CALCIUM) 500 MG chewable tablet Chew 1 tablet by mouth every 4 (four) hours as needed for indigestion or heartburn.   Yes Historical Provider, MD  calcium-vitamin D (OSCAL WITH D) 500-200 MG-UNIT per tablet Take 1 tablet by mouth 2 (two) times daily.   Yes Historical Provider, MD  docusate sodium (COLACE) 100 MG capsule Take 100 mg by mouth 2 (two) times daily.  Yes Historical Provider, MD  gabapentin (NEURONTIN) 300 MG capsule Take 300 mg by mouth at bedtime. Takes along with 400mg  to =700mg    Yes Historical Provider, MD  gabapentin (NEURONTIN) 400 MG capsule Take 400 mg by mouth at bedtime. Take with 300 mg cap to equal 700 mg   Yes Historical Provider, MD  gabapentin (NEURONTIN) 400 MG capsule Take 400 mg by mouth at bedtime.   Yes Historical Provider, MD  ibuprofen (ADVIL,MOTRIN) 200 MG tablet Take 200 mg by mouth 2 (two) times daily as needed for mild pain. For pain    Yes Historical Provider, MD  ipratropium-albuterol (DUONEB) 0.5-2.5 (3) MG/3ML SOLN Take 3 mLs by nebulization every 6 (six) hours as needed (for wheezing).   Yes Historical Provider, MD  lisinopril (PRINIVIL,ZESTRIL) 30 MG tablet Take 30 mg by mouth daily.   Yes Historical  Provider, MD  loratadine (CLARITIN) 10 MG tablet Take 10 mg by mouth daily as needed for allergies.   Yes Historical Provider, MD  Multiple Vitamin (MULITIVITAMIN WITH MINERALS) TABS Take 1 tablet by mouth daily.   Yes Historical Provider, MD  pantoprazole (PROTONIX) 40 MG tablet Take 40 mg by mouth daily.   Yes Historical Provider, MD  prazosin (MINIPRESS) 1 MG capsule Take 1 mg by mouth 2 (two) times daily.   Yes Historical Provider, MD  prazosin (MINIPRESS) 2 MG capsule Take 2 mg by mouth 2 (two) times daily.    Yes Historical Provider, MD  propranolol (INDERAL LA) 160 MG SR capsule Take 160 mg by mouth daily.   Yes Historical Provider, MD  cloNIDine (CATAPRES) 0.1 MG tablet Take 0.1 mg by mouth every 6 (six) hours as needed. For systolic BP greater than or equal to 170    Historical Provider, MD   BP 154/82  Pulse 84  Temp(Src) 98.1 F (36.7 C) (Oral)  Resp 18  Ht 5\' 5"  (1.651 m)  Wt 126 lb 4.8 oz (57.289 kg)  BMI 21.02 kg/m2  SpO2 94% Vitals reviewed Physical Exam Physical Examination: General appearance - alert, well appearing, and in no distress Mental status - alert, oriented to person, place, and time Eyes - pupils equal and reactive, extraocular eye movements intact Mouth - mucous membranes moist, pharynx normal without lesions Chest - clear to auscultation, no wheezes, rales or rhonchi, symmetric air entry Heart - normal rate, regular rhythm, normal S1, S2, no murmurs, rubs, clicks or gallops Abdomen - soft, nontender, nondistended, no masses or organomegaly Neurological - alert, oriented, normal speech, cranial nerves grossly intact, strength 5/5 in extremities x 4, sensation intact Extremities - peripheral pulses normal, no pedal edema, no clubbing or cyanosis Skin - normal coloration and turgor, no rashes  ED Course  Procedures (including critical care time)  7:15 PM d/w [facility] for admission, pt to go to telemetry bed, observation.   Labs Review Labs Reviewed  BASIC  METABOLIC PANEL - Abnormal; Notable for the following:    Glucose, Bld 110 (*)    GFR calc non Af Amer 80 (*)    All other components within normal limits  MRSA PCR SCREENING  CBC  TROPONIN I  HEPATIC FUNCTION PANEL  LIPASE, BLOOD  TROPONIN I  TROPONIN I  TROPONIN I    Imaging Review Dg Chest 2 View  08/04/2013   CLINICAL DATA:  Chest pain and weakness.  EXAM: CHEST  2 VIEW  COMPARISON:  08/15/2011.  FINDINGS: Trachea is midline. Heart size stable. Thoracic aorta is calcified. Biapical pleural parenchymal scarring. There  may be mild scarring at the left lung base. Lungs are otherwise clear. No pleural fluid.  IMPRESSION: No acute findings.   Electronically Signed   By: Leanna BattlesMelinda  Blietz M.D.   On: 08/04/2013 15:06     EKG Interpretation   Date/Time:  Wednesday August 04 2013 13:55:13 EDT Ventricular Rate:  94 PR Interval:  255 QRS Duration: 101 QT Interval:  369 QTC Calculation: 461 R Axis:   53 Text Interpretation:  Sinus rhythm Prolonged PR interval Probable left  atrial enlargement Abnrm T, consider ischemia, anterolateral lds Baseline  wander in lead(s) II III aVF more artifact compared to prior tracing  Confirmed by [facility]  MD, Chalmer Zheng [phone]([id number]) on 08/04/2013 5:34:43 PM      MDM   Final diagnoses:  Chest pain  Hypertension, accelerated    Pt presenting with c/o chest pain and weakness.  Her initial workup is reassuring, she had a brief episode of chest pain in the ED, relieved after nitroglycerin.  D/w [facility] for admission.      Ethelda ChickMartha K Linker, MD 08/04/13 [phone]

## 2013-08-04 NOTE — ED Notes (Signed)
Pt placed on Cardiac monitor. Computer not showing EKG reading. Replacing EKG stickers.

## 2013-08-04 NOTE — ED Notes (Signed)
IV attempt X 2 without success- phlebotomy called for blood draw.

## 2013-08-04 NOTE — ED Notes (Signed)
Phlebotomy at bedside.

## 2013-08-05 ENCOUNTER — Observation Stay (HOSPITAL_COMMUNITY): Payer: PRIVATE HEALTH INSURANCE

## 2013-08-05 DIAGNOSIS — I10 Essential (primary) hypertension: Secondary | ICD-10-CM

## 2013-08-05 DIAGNOSIS — R079 Chest pain, unspecified: Secondary | ICD-10-CM

## 2013-08-05 DIAGNOSIS — R0789 Other chest pain: Secondary | ICD-10-CM | POA: Diagnosis not present

## 2013-08-05 LAB — TROPONIN I
Troponin I: <0.3 ng/mL (ref <0.30)
Troponin I: <0.3 ng/mL (ref <0.30)

## 2013-08-05 LAB — MRSA PCR SCREENING: MRSA BY PCR: POSITIVE — AB

## 2013-08-05 MED ORDER — METHYLPREDNISOLONE 4 MG PO KIT
4.0000 mg | PACK | ORAL | Status: AC
  Administered 2013-08-05: 4 mg via ORAL

## 2013-08-05 MED ORDER — METHYLPREDNISOLONE 4 MG PO KIT
8.0000 mg | PACK | Freq: Every morning | ORAL | Status: AC
  Administered 2013-08-05: 8 mg via ORAL
  Filled 2013-08-05: qty 21

## 2013-08-05 MED ORDER — METHYLPREDNISOLONE 4 MG PO KIT: 8.0000 mg | PACK | Freq: Every evening | ORAL | Status: DC

## 2013-08-05 MED ORDER — REGADENOSON 0.4 MG/5ML IV SOLN
0.4000 mg | Freq: Once | INTRAVENOUS | Status: AC
  Administered 2013-08-05: 0.4 mg via INTRAVENOUS

## 2013-08-05 MED ORDER — TECHNETIUM TC 99M SESTAMIBI GENERIC - CARDIOLITE
10.0000 | Freq: Once | INTRAVENOUS | Status: AC | PRN
  Administered 2013-08-05: 10 via INTRAVENOUS

## 2013-08-05 MED ORDER — ONDANSETRON HCL 4 MG/2ML IJ SOLN
INTRAMUSCULAR | Status: AC
  Filled 2013-08-05: qty 2

## 2013-08-05 MED ORDER — METHYLPREDNISOLONE 4 MG PO KIT
4.0000 mg | PACK | Freq: Four times a day (QID) | ORAL | Status: DC
Start: 2013-08-07 — End: 2013-08-06

## 2013-08-05 MED ORDER — PANTOPRAZOLE SODIUM 40 MG PO TBEC
40.0000 mg | DELAYED_RELEASE_TABLET | Freq: Two times a day (BID) | ORAL | Status: DC
  Administered 2013-08-05 – 2013-08-06 (×2): 40 mg via ORAL
  Filled 2013-08-05 (×2): qty 1

## 2013-08-05 MED ORDER — TECHNETIUM TC 99M SESTAMIBI GENERIC - CARDIOLITE
30.0000 | Freq: Once | INTRAVENOUS | Status: AC | PRN
  Administered 2013-08-05: 30 via INTRAVENOUS

## 2013-08-05 MED ORDER — GI COCKTAIL ~~LOC~~
30.0000 mL | Freq: Three times a day (TID) | ORAL | Status: DC | PRN
  Administered 2013-08-05: 30 mL via ORAL
  Filled 2013-08-05 (×2): qty 30

## 2013-08-05 MED ORDER — REGADENOSON 0.4 MG/5ML IV SOLN
INTRAVENOUS | Status: AC
  Filled 2013-08-05: qty 5

## 2013-08-05 MED ORDER — METHYLPREDNISOLONE 4 MG PO KIT
4.0000 mg | PACK | Freq: Three times a day (TID) | ORAL | Status: DC
  Administered 2013-08-06: 4 mg via ORAL

## 2013-08-05 NOTE — Progress Notes (Signed)
UR Completed.  [name] [name] [phone] 08/05/2013  

## 2013-08-05 NOTE — Progress Notes (Signed)
[facility] PROGRESS NOTE  Valerie BurnsMildred V Brewer ZOX:[id number] DOB: 1925/12/07 DOA: 08/04/2013 PCP: Terald SleeperOBSON,Valerie GAVIN, MD  Assessment/Plan: 1-chest pain; Myoview with no reversible ischemia. Old scar. Troponin times 3 negative. Will ask GI for evaluation, patient might need endoscopy. LFT normal, no RUQ pain doubt gallbladder pathology.   2-Malignant Hypertension; better controlled.  3-  Code Status:  DNR.  Family Communication: care discussed with multiple family members.  Disposition Plan: remain inpatient.    Consultants:  Cardio  GI  Procedures: Myoview; Abnormal Myoview stress test demonstrating old fixed inferior and  inferolateral defect consistent with myocardial scar. There is no  reversible ischemia. There is mild left ventricular systolic  dysfunction.    Antibiotics:  none  HPI/Subjective: Patient with chest pain on and off. Per family patient has complain of chest pain when eating. Patient with weight loss, decrease appetite, nausea. Patient has vomited at SNF.   Objective: Filed Vitals:   08/05/13 1150  BP: 131/64  Pulse:   Temp:   Resp:     Intake/Output Summary (Last 24 hours) at 08/05/13 1538 Last data filed at 08/05/13 0606  Gross per 24 hour  Intake    222 ml  Output    300 ml  Net    -78 ml   Filed Weights   08/04/13 1956 08/05/13 0000  Weight: 57.289 kg (126 lb 4.8 oz) 57.289 kg (126 lb 4.8 oz)    Exam:   General:  No distress.   Cardiovascular: S 1, S 2 RRR  Respiratory: CTA  Abdomen: BS present, soft, nt  Musculoskeletal: trace edema.   Data Reviewed: Basic Metabolic Panel:  Recent Labs Lab 08/04/13 1649  NA 141  K 4.2  CL 100  CO2 27  GLUCOSE 110*  BUN 16  CREATININE 0.62  CALCIUM 9.2   Liver Function Tests:  Recent Labs Lab 08/04/13 1649  AST 18  ALT 9  ALKPHOS 93  BILITOT 0.7  PROT 7.4  ALBUMIN 3.6    Recent Labs Lab 08/04/13 1649  LIPASE 35   No results found for this basename:  AMMONIA,  in the last 168 hours CBC:  Recent Labs Lab 08/04/13 1649  WBC 7.0  HGB 13.0  HCT 40.3  MCV 88.0  PLT 198   Cardiac Enzymes:  Recent Labs Lab 08/04/13 1623 08/04/13 2125 08/05/13 0315 08/05/13 1005  TROPONINI <0.30 <0.30 <0.30 <0.30   BNP (last 3 results) No results found for this basename: PROBNP,  in the last 8760 hours CBG: No results found for this basename: GLUCAP,  in the last 168 hours  Recent Results (from the past 240 hour(s))  MRSA PCR SCREENING     Status: Abnormal   Collection Time    08/05/13 12:03 AM      Result Value Ref Range Status   MRSA by PCR POSITIVE (*) NEGATIVE Final   Comment:            The GeneXpert MRSA Assay (FDA     approved for NASAL specimens     only), is one component of [facility]     comprehensive MRSA colonization     surveillance program. It is not     intended to diagnose MRSA     infection nor to guide or     monitor treatment for     MRSA infections.     RESULT CALLED TO, READ BACK BY AND VERIFIED WITH:     [facility]. [facility] RN [phone] GREEN R  Studies: Dg Chest 2 View  08/04/2013   CLINICAL DATA:  Chest pain and weakness.  EXAM: CHEST  2 VIEW  COMPARISON:  08/15/2011.  FINDINGS: Trachea is midline. Heart size stable. Thoracic aorta is calcified. Biapical pleural parenchymal scarring. There may be mild scarring at the left lung base. Lungs are otherwise clear. No pleural fluid.  IMPRESSION: No acute findings.   Electronically Signed   By: Leanna BattlesMelinda  Blietz M.D.   On: 08/04/2013 15:06   Nm Myocar Multi W/spect W/wall Motion / Ef  08/05/2013   EXAM: MYOCARDIAL IMAGING WITH SPECT (REST AND PHARMACOLOGIC-STRESS)  GATED LEFT VENTRICULAR WALL MOTION STUDY  LEFT VENTRICULAR EJECTION FRACTION  TECHNIQUE: Standard myocardial SPECT imaging was performed after resting intravenous injection of 10 mCi Tc-3165m sestamibi. Subsequently, intravenous infusion of Lexiscan was performed under the supervision of the Cardiology staff. At peak effect  of the drug, 30 mCi Tc-2265m sestamibi was injected intravenously and standard myocardial SPECT imaging was performed. Quantitative gated imaging was also performed to evaluate left ventricular wall motion, and estimate left ventricular ejection fraction.  COMPARISON:  None.  FINDINGS: The patient underwent Lexiscan Myoview stress testing under the [facility]. The patient tolerated the procedure well. She did not experience any chest discomfort.the electrocardiogram at rest shows normal sinus rhythm with nonspecific T-wave changes. With stress, there are no new EKG changes to suggest ischemia.  The quality of the images is satisfactory. Perfusion imaging shows [facility] Medium-sized defect of moderate severity involving the apical inferior, midinferior, basal inferior, and apical inferolateral, mid inferolateral, and basal inferolateral segments. There is no reversible ischemia.  The left ventricular end diastolic volume is 52 mL. the left ventricular end systolic volume is 28 mL. The left ventricular ejection fraction is 47%. There is mild inferior wall hypokinesis.  IMPRESSION: Abnormal Myoview stress test demonstrating old fixed inferior and inferolateral defect consistent with myocardial scar. There is no reversible ischemia. There is mild left ventricular systolic dysfunction.   Electronically Signed   By: Cassell Clementhomas  Brackbill M.D.   On: 08/05/2013 14:48    Scheduled Meds: . amLODipine  5 mg Oral Daily  . aspirin  81 mg Oral Daily  . docusate sodium  100 mg Oral BID  . gabapentin  300 mg Oral QHS   And  . gabapentin  400 mg Oral QHS  . heparin  5,000 Units Subcutaneous 3 times per day  . lisinopril  30 mg Oral Daily  . multivitamin with minerals  1 tablet Oral Daily  . ondansetron      . pantoprazole  40 mg Oral Daily  . prazosin  2 mg Oral BID   And  . prazosin  1 mg Oral BID  . propranolol ER  160 mg Oral Daily  . regadenoson       Continuous Infusions:   Principal  Problem:   Chest pain Active Problems:   Hypertension, accelerated    Time spent: 35 minutes.     [name] [name]  [facility] Pager [phone]. If 7PM-7AM, please contact night-coverage at [email or web address], password [email or web address] 08/05/2013, 3:38 PM  LOS: 1 day

## 2013-08-05 NOTE — Consult Note (Signed)
Unassigned Consult  Reason for Consult: Chest pain Referring Physician: [facility]  Langley AdieMildred V Stickley HPI: This is an 80 year old female with multiple medical problems admitted for chest pain.  The patient has a history of chest pain that is worsened when her SBP is elevated.  On a consistent basis she will have a resolution of her chest pain when her BP has normalized with NTG and clonadine.  Her myoview performed today is abnormal with evidence of prior ischemic injury.  However, in speaking with the family, her chest pain is not always associated with the elevation in her BP.  The chest pain is intermittent and located in the epigastrium/sternal region.  No issues with nausea, vomiting, or dysphagia.  She has a history of GERD and a hiatal hernia.  Intermittently her pain can be improved with a GI cocktail, but this is not always efficacious.  Past Medical History  Diagnosis Date  . Chronic back pain   . Obstructive chronic bronchitis   . Muscle weakness   . Cirrhosis   . Cerebral aneurysm 07/2011    "non ruptured; left side; behind optical nerve"  . H/O hiatal hernia   . Complication of anesthesia     "don't like medicine or to be put to sleep"  . HOH (hard of hearing), right   . Deaf, left   . Hypertension   . Hypertensive encephalopathy   . Pacemaker     'as a child"  . DVT (deep venous thrombosis), right 1970's    "not long after knee OR"  . Emphysema   . Sleep apnea     "doesn't wear mask"  . Exertional dyspnea   . Shortness of breath 08/15/11    "& when lying down"  . GERD (gastroesophageal reflux disease)   . Hepatitis   . Steatohepatitis, non-alcoholic   . Headache(784.0)   . Migraines   . Scoliosis   . Osteoarthritis of lumbar spine   . History of shingles 05/2010    Past Surgical History  Procedure Laterality Date  . Vaginal hysterectomy  1980's  . Knee arthroscopy  ?1970's    right  . Tympanoplasty  1950's    removed; left  . Myringotomy  08/2011     right  . Dilation and curettage of uterus  1970's    No family history on file.  Social History:  reports that she has never smoked. She has never used smokeless tobacco. She reports that she does not drink alcohol or use illicit drugs.  Allergies:  Allergies  Allergen Reactions  . Acetaminophen Nausea And Vomiting  . Penicillins Anaphylaxis  . Vicodin [Hydrocodone-Acetaminophen] Nausea And Vomiting    Medications:  Scheduled: . amLODipine  5 mg Oral Daily  . aspirin  81 mg Oral Daily  . docusate sodium  100 mg Oral BID  . gabapentin  300 mg Oral QHS   And  . gabapentin  400 mg Oral QHS  . heparin  5,000 Units Subcutaneous 3 times per day  . lisinopril  30 mg Oral Daily  . methylPREDNISolone  4 mg Oral PC lunch  . methylPREDNISolone  4 mg Oral PC supper  . [START ON 08/06/2013] methylPREDNISolone  4 mg Oral 3 x daily with food  . [START ON 08/07/2013] methylPREDNISolone  4 mg Oral 4X daily taper  . methylPREDNISolone  8 mg Oral AC breakfast  . methylPREDNISolone  8 mg Oral Nightly  . [START ON 08/06/2013] methylPREDNISolone  8 mg Oral Nightly  .  multivitamin with minerals  1 tablet Oral Daily  . ondansetron      . pantoprazole  40 mg Oral BID  . prazosin  2 mg Oral BID   And  . prazosin  1 mg Oral BID  . propranolol ER  160 mg Oral Daily  . regadenoson       Continuous:   Results for orders placed during the hospital encounter of 08/04/13 (from the past 24 hour(s))  TROPONIN I     Status: None   Collection Time    08/04/13  9:25 PM      Result Value Ref Range   Troponin I <0.30  <0.30 ng/mL  MRSA PCR SCREENING     Status: Abnormal   Collection Time    08/05/13 12:03 AM      Result Value Ref Range   MRSA by PCR POSITIVE (*) NEGATIVE  TROPONIN I     Status: None   Collection Time    08/05/13  3:15 AM      Result Value Ref Range   Troponin I <0.30  <0.30 ng/mL  TROPONIN I     Status: None   Collection Time    08/05/13 10:05 AM      Result Value Ref Range    Troponin I <0.30  <0.30 ng/mL     Dg Chest 2 View  08/04/2013   CLINICAL DATA:  Chest pain and weakness.  EXAM: CHEST  2 VIEW  COMPARISON:  08/15/2011.  FINDINGS: Trachea is midline. Heart size stable. Thoracic aorta is calcified. Biapical pleural parenchymal scarring. There may be mild scarring at the left lung base. Lungs are otherwise clear. No pleural fluid.  IMPRESSION: No acute findings.   Electronically Signed   By: Leanna Battles M.D.   On: 08/04/2013 15:06   Nm Myocar Multi W/spect W/wall Motion / Ef  08/05/2013   EXAM: MYOCARDIAL IMAGING WITH SPECT (REST AND PHARMACOLOGIC-STRESS)  GATED LEFT VENTRICULAR WALL MOTION STUDY  LEFT VENTRICULAR EJECTION FRACTION  TECHNIQUE: Standard myocardial SPECT imaging was performed after resting intravenous injection of 10 mCi Tc-61m sestamibi. Subsequently, intravenous infusion of Lexiscan was performed under the supervision of the Cardiology staff. At peak effect of the drug, 30 mCi Tc-40m sestamibi was injected intravenously and standard myocardial SPECT imaging was performed. Quantitative gated imaging was also performed to evaluate left ventricular wall motion, and estimate left ventricular ejection fraction.  COMPARISON:  None.  FINDINGS: The patient underwent Lexiscan Myoview stress testing under the supervision of the [facility] staff. The patient tolerated the procedure well. She did not experience any chest discomfort.the electrocardiogram at rest shows normal sinus rhythm with nonspecific T-wave changes. With stress, there are no new EKG changes to suggest ischemia.  The quality of the images is satisfactory. Perfusion imaging shows a Medium-sized defect of moderate severity involving the apical inferior, midinferior, basal inferior, and apical inferolateral, mid inferolateral, and basal inferolateral segments. There is no reversible ischemia.  The left ventricular end diastolic volume is 52 mL. the left ventricular end systolic volume is 28 mL. The  left ventricular ejection fraction is 47%. There is mild inferior wall hypokinesis.  IMPRESSION: Abnormal Myoview stress test demonstrating old fixed inferior and inferolateral defect consistent with myocardial scar. There is no reversible ischemia. There is mild left ventricular systolic dysfunction.   Electronically Signed   By: Cassell Clement M.D.   On: 08/05/2013 14:48    ROS:  As stated above in the HPI otherwise negative.  Blood pressure  135/76, pulse 83, temperature 98.5 F (36.9 C), temperature source Oral, resp. rate 18, height 5\' 5"  (1.651 m), weight 131 lb 3.2 oz (59.512 kg), SpO2 95.00%.    PE: Gen: NAD, Alert and Oriented HEENT:  /AT, EOMI Neck: Supple, no LAD Lungs: CTA Bilaterally CV: RRR without M/G/R ABM: Soft, NTND, +BS, reproducible pain with palpation of the xyphoid process Ext: No C/C/E  Assessment/Plan: 1) Costochondritis. 2) Noncardiac chest pain. 3) HTN.   I think the patient has costochondritis.  I was able to reproduce the pain with palpation.  There is clearly no epigastric pain.  I think a trial of a Medrol Dose Pack is in order.  Hopefully this can resolve or help to reduce the pain.  Plan: 1) Medrol dose pack.  Theda Belfastatrick D Toshiyuki Fredell 08/05/2013, 5:34 PM

## 2013-08-05 NOTE — Progress Notes (Signed)
Chest pain rounds.  Chart reviewed. Patient examined.  Patient has been having substernal chest pain in association with marked hypertensive response. Family cannot tell whether the chest pain or the hypertension occurs first. Will proceed with lexiscan myoview. Discussed with family and daughter who is POA and would like patient to have the test to guide future therapy before she returns to the nursing home. Needs aggressive BP control. BP improved since admission.

## 2013-08-05 NOTE — Progress Notes (Signed)
Clinical Social Work Department BRIEF PSYCHOSOCIAL ASSESSMENT 08/05/2013  Patient:  [name],[name]     Account Number:  [id number]     Admit date:  08/04/2013  Clinical Social Worker:  Harless NakayamaAMBELAL,Matis Monnier, LCSWA  Date/Time:  08/05/2013 03:45 PM  Referred by:  Physician  Date Referred:  08/05/2013 Referred for  SNF Placement   Other Referral:   Interview type:  Patient Other interview type:   Spoke with pt and pt daughters at bedside    PSYCHOSOCIAL DATA Living Status:  FACILITY [facility] Level of care:  Skilled Nursing Facility Primary support name:  [name] [phone] Primary support relationship to patient:  CHILD, ADULT Degree of support available:   Pt has supportive family    CURRENT CONCERNS Current Concerns  Post-Acute Placement   Other Concerns:    SOCIAL WORK ASSESSMENT / PLAN CSW informed pt was admitted from facility. CSW visited pt room and spoke with pt and pt daughter at bedside. Pt informed CSW [facility] and is very happy there. Pt stated she would definitely like to return at discharge. Pt and pt daughter had no questions. CSW informed them that CSW would assist with dc when pt was medically ready.    CSW called facility and confirmed pt can return when medically stable.   Assessment/plan status:  Psychosocial Support/Ongoing Assessment of Needs Other assessment/ plan:   Information/referral to community resources:   None needed    PATIENT'S/FAMILY'S RESPONSE TO PLAN OF CARE: Pt and family [facility]        [place] Ewan Grau, LCSWA [phone]

## 2013-08-06 DIAGNOSIS — J449 Chronic obstructive pulmonary disease, unspecified: Secondary | ICD-10-CM

## 2013-08-06 DIAGNOSIS — R0789 Other chest pain: Secondary | ICD-10-CM | POA: Diagnosis not present

## 2013-08-06 MED ORDER — METHYLPREDNISOLONE 4 MG PO KIT: PACK | ORAL | Status: DC

## 2013-08-06 MED ORDER — METHYLPREDNISOLONE 4 MG PO KIT: 4.0000 mg | PACK | Freq: Three times a day (TID) | ORAL | Status: DC

## 2013-08-06 MED ORDER — METHYLPREDNISOLONE 4 MG PO KIT: 4.0000 mg | PACK | ORAL | Status: DC

## 2013-08-06 MED ORDER — MUPIROCIN 2 % EX OINT
TOPICAL_OINTMENT | Freq: Two times a day (BID) | CUTANEOUS | Status: DC
  Filled 2013-08-06: qty 22

## 2013-08-06 MED ORDER — METHYLPREDNISOLONE 4 MG PO KIT: 4.0000 mg | PACK | Freq: Four times a day (QID) | ORAL | Status: DC

## 2013-08-06 MED ORDER — CHLORHEXIDINE GLUCONATE CLOTH 2 % EX PADS: 6.0000 | MEDICATED_PAD | Freq: Every day | CUTANEOUS | Status: DC

## 2013-08-06 MED ORDER — METHYLPREDNISOLONE 4 MG PO KIT: 8.0000 mg | PACK | Freq: Every evening | ORAL | Status: DC

## 2013-08-06 NOTE — Progress Notes (Signed)
[facility] MD order. Pt and family reviewed discharge instructions prior to discharge and verbalized understanding. Pt alert and oriented at discharge with no complaints of pain.  [facility] All belongings sent with patient. [facility], Charity fundraiserN. Joylene GrapesEmily C Joanthony Hamza

## 2013-08-06 NOTE — Discharge Summary (Signed)
Physician Discharge Summary  MILES BORKOWSKI WJX:[id number] DOB: 09-Jan-1926 DOA: 08/04/2013  PCP: Terald Sleeper, MD  Admit date: 08/04/2013 Discharge date: 08/06/2013  Time spent: 35 minutes  Recommendations for Outpatient Follow-up:  1. Follow up with Dr Elnoria Howard in 2 weeks.  2. Follow up with cardiology as needed.  3. Please follow up CBG now patient is on steroids.   Discharge Diagnoses:    Chest pain, possible costochondritis.    Hypertension, accelerated   Discharge Condition: stable.   Diet recommendation: Heart Healthy  Filed Weights   08/05/13 0000 08/05/13 1548 08/06/13 0641  Weight: 57.289 kg (126 lb 4.8 oz) 59.512 kg (131 lb 3.2 oz) 56.518 kg (124 lb 9.6 oz)    History of present illness:  [name] is a 80 y.o. female who presents to the ED with c/o intermittent chest pain over the past 2-3 days. Symptoms have been worsening. Chest pain is substernal, sharp, and intermittent. There is a clear association with HTN noted at the nursing home, typically during chest pain episodes her BP will be running over 200 systolic. Her chest pain is rapidly relieved and her BP rapidly reduced when she is given NTG and clonadine which she takes PRN. Today the same thing happened, she developed CP, SBP noted to be 210, given clonadine and NTG, BP came down to the 160s and CP resolved.   Hospital Course:  1-chest pain; Myoview with no reversible ischemia. Old scar. Troponin times 3 negative. LFT normal, no RUQ pain doubt gallbladder pathology. Dr Elnoria Howard with GI evaluate patient. He think patient has costochondritis. Pain reproduce on palpation. Patient was started on methylprednisolone taper . Continue with PPI. Patient feeling better. Plan to transfer to SNF today. Need to check Blood sugar, now that patient is on steroids.   2-Malignant Hypertension; better controlled.     Procedures: Myoview; Abnormal Myoview stress test demonstrating old fixed inferior and  inferolateral  defect consistent with myocardial scar. There is no  reversible ischemia. There is mild left ventricular systolic  dysfunction.    Consultations:  Cardiology, Dr Patty Sermons  Dr Elnoria Howard.   Discharge Exam: Filed Vitals:   08/06/13 1005  BP: 139/68  Pulse: 78  Temp:   Resp:     General: No distress.  Cardiovascular: S 1, S 2 RRR Respiratory: CTA  Discharge Instructions You were cared for by a hospitalist during your hospital stay. If you have any questions about your discharge medications or the care you received while you were in the hospital after you are discharged, you can call the unit and asked to speak with the hospitalist on call if the hospitalist that took care of you is not available. Once you are discharged, your primary care physician will handle any further medical issues. Please note that NO REFILLS for any discharge medications will be authorized once you are discharged, as it is imperative that you return to your primary care physician (or establish a relationship with a primary care physician if you do not have one) for your aftercare needs so that they can reassess your need for medications and monitor your lab values.  Discharge Orders   Future Appointments Provider Department Dept Phone   09/06/2013 11:00 AM [facility] [phone]   Future Orders Complete By Expires   Diet - low sodium heart healthy  As directed    Increase activity slowly  As directed        Medication List    STOP  taking these medications       ibuprofen 200 MG tablet  Commonly known as:  ADVIL,MOTRIN      TAKE these medications       amLODipine 5 MG tablet  Commonly known as:  NORVASC  Take 5 mg by mouth daily.     aspirin 81 MG chewable tablet  Chew 81 mg by mouth daily.     calcium carbonate 500 MG chewable tablet  Commonly known as:  TUMS - dosed in mg elemental calcium  Chew 1 tablet by mouth every 4 (four) hours as needed for  indigestion or heartburn.     calcium-vitamin D 500-200 MG-UNIT per tablet  Commonly known as:  OSCAL WITH D  Take 1 tablet by mouth 2 (two) times daily.     cloNIDine 0.1 MG tablet  Commonly known as:  CATAPRES  Take 0.1 mg by mouth every 6 (six) hours as needed. For systolic BP greater than or equal to 170     docusate sodium 100 MG capsule  Commonly known as:  COLACE  Take 100 mg by mouth 2 (two) times daily.     gabapentin 400 MG capsule  Commonly known as:  NEURONTIN  Take 400 mg by mouth at bedtime. Take with 300 mg cap to equal 700 mg     gabapentin 300 MG capsule  Commonly known as:  NEURONTIN  Take 300 mg by mouth at bedtime. Takes along with 400mg  to =700mg      ipratropium-albuterol 0.5-2.5 (3) MG/3ML Soln  Commonly known as:  DUONEB  Take 3 mLs by nebulization every 6 (six) hours as needed (for wheezing).     lisinopril 30 MG tablet  Commonly known as:  PRINIVIL,ZESTRIL  Take 30 mg by mouth daily.     loratadine 10 MG tablet  Commonly known as:  CLARITIN  Take 10 mg by mouth daily as needed for allergies.     methylPREDNISolone 4 MG tablet  Commonly known as:  MEDROL DOSEPAK  Take 4 mg TID for one day then take 4 mg QID for 2 days then take 8 mg daily for two days then stop.     multivitamin with minerals Tabs tablet  Take 1 tablet by mouth daily.     pantoprazole 40 MG tablet  Commonly known as:  PROTONIX  Take 40 mg by mouth daily.     prazosin 2 MG capsule  Commonly known as:  MINIPRESS  Take 2 mg by mouth 2 (two) times daily. Take 2 mg capsule along with 1 mg capsule for total of 3 mg twice daily     prazosin 1 MG capsule  Commonly known as:  MINIPRESS  Take 1 mg by mouth 2 (two) times daily. Take 2 mg capsule along with 1 mg capsule for total of 3 mg twice daily     propranolol ER 160 MG SR capsule  Commonly known as:  INDERAL LA  Take 160 mg by mouth daily.       Allergies  Allergen Reactions  . Acetaminophen Nausea And Vomiting  .  Penicillins Anaphylaxis  . Vicodin [Hydrocodone-Acetaminophen] Nausea And Vomiting      The results of significant diagnostics from this hospitalization (including imaging, microbiology, ancillary and laboratory) are listed below for reference.    Significant Diagnostic Studies: Dg Chest 2 View  08/04/2013   CLINICAL DATA:  Chest pain and weakness.  EXAM: CHEST  2 VIEW  COMPARISON:  08/15/2011.  FINDINGS: Trachea is midline. Heart size stable.  Thoracic aorta is calcified. Biapical pleural parenchymal scarring. There may be mild scarring at the left lung base. Lungs are otherwise clear. No pleural fluid.  IMPRESSION: No acute findings.   Electronically Signed   By: Leanna BattlesMelinda  Blietz M.D.   On: 08/04/2013 15:06   Nm Myocar Multi W/spect W/wall Motion / Ef  08/05/2013   EXAM: MYOCARDIAL IMAGING WITH SPECT (REST AND PHARMACOLOGIC-STRESS)  GATED LEFT VENTRICULAR WALL MOTION STUDY  LEFT VENTRICULAR EJECTION FRACTION  TECHNIQUE: Standard myocardial SPECT imaging was performed after resting intravenous injection of 10 mCi Tc-6375m sestamibi. Subsequently, intravenous infusion of Lexiscan was performed under the supervision of the Cardiology staff. At peak effect of the drug, 30 mCi Tc-7075m sestamibi was injected intravenously and standard myocardial SPECT imaging was performed. Quantitative gated imaging was also performed to evaluate left ventricular wall motion, and estimate left ventricular ejection fraction.  COMPARISON:  None.  FINDINGS: The patient underwent Lexiscan Myoview stress testing under the supervision of the [facility] staff. The patient tolerated the procedure well. She did not experience any chest discomfort.the electrocardiogram at rest shows normal sinus rhythm with nonspecific T-wave changes. With stress, there are no new EKG changes to suggest ischemia.  The quality of the images is satisfactory. Perfusion imaging shows a Medium-sized defect of moderate severity involving the apical  inferior, midinferior, basal inferior, and apical inferolateral, mid inferolateral, and basal inferolateral segments. There is no reversible ischemia.  The left ventricular end diastolic volume is 52 mL. the left ventricular end systolic volume is 28 mL. The left ventricular ejection fraction is 47%. There is mild inferior wall hypokinesis.  IMPRESSION: Abnormal Myoview stress test demonstrating old fixed inferior and inferolateral defect consistent with myocardial scar. There is no reversible ischemia. There is mild left ventricular systolic dysfunction.   Electronically Signed   By: Cassell Clementhomas  Brackbill M.D.   On: 08/05/2013 14:48    Microbiology: Recent Results (from the past 240 hour(s))  MRSA PCR SCREENING     Status: Abnormal   Collection Time    08/05/13 12:03 AM      Result Value Ref Range Status   MRSA by PCR POSITIVE (*) NEGATIVE Final   Comment:            The GeneXpert MRSA Assay (FDA     approved for NASAL specimens     only), is one component of a     comprehensive MRSA colonization     surveillance program. It is not     intended to diagnose MRSA     infection nor to guide or     monitor treatment for     MRSA infections.     RESULT CALLED TO, READ BACK BY AND VERIFIED WITH:     A. [facility] RN [phone] GREEN R     Labs: Basic Metabolic Panel:  Recent Labs Lab 08/04/13 1649  NA 141  K 4.2  CL 100  CO2 27  GLUCOSE 110*  BUN 16  CREATININE 0.62  CALCIUM 9.2   Liver Function Tests:  Recent Labs Lab 08/04/13 1649  AST 18  ALT 9  ALKPHOS 93  BILITOT 0.7  PROT 7.4  ALBUMIN 3.6    Recent Labs Lab 08/04/13 1649  LIPASE 35   No results found for this basename: AMMONIA,  in the last 168 hours CBC:  Recent Labs Lab 08/04/13 1649  WBC 7.0  HGB 13.0  HCT 40.3  MCV 88.0  PLT 198   Cardiac Enzymes:  Recent Labs  Lab 08/04/13 1623 08/04/13 2125 08/05/13 0315 08/05/13 1005  TROPONINI <0.30 <0.30 <0.30 <0.30   BNP: BNP (last 3 results) No  results found for this basename: PROBNP,  in the last 8760 hours CBG: No results found for this basename: GLUCAP,  in the last 168 hours     Signed:  Kaede Clendenen A Samarion Ehle  [facility] 08/06/2013, 11:46 AM

## 2013-08-06 NOTE — Progress Notes (Signed)
Subjective: Vomited when she tried to take her prednisone.  Objective: Vital signs in last 24 hours: Temp:  [97.6 F (36.4 C)-98.7 F (37.1 C)] 98.1 F (36.7 C) (05/01 0641) Pulse Rate:  [67-111] 67 (05/01 0641) Resp:  [18-19] 18 (05/01 0641) BP: (120-187)/(52-131) 120/59 mmHg (05/01 0641) SpO2:  [93 %-98 %] 93 % (05/01 0641) Weight:  [124 lb 9.6 oz (56.518 kg)-131 lb 3.2 oz (59.512 kg)] 124 lb 9.6 oz (56.518 kg) (05/01 0641) Last BM Date: 08/05/13  Intake/Output from previous day:   Intake/Output this shift:    General appearance: alert and no distress GI: soft, non-tender; bowel sounds normal; no masses,  no organomegaly  Lab Results:  Recent Labs  08/04/13 1649  WBC 7.0  HGB 13.0  HCT 40.3  PLT 198   BMET  Recent Labs  08/04/13 1649  NA 141  K 4.2  CL 100  CO2 27  GLUCOSE 110*  BUN 16  CREATININE 0.62  CALCIUM 9.2   LFT  Recent Labs  08/04/13 1649  PROT 7.4  ALBUMIN 3.6  AST 18  ALT 9  ALKPHOS 93  BILITOT 0.7  BILIDIR <0.2  IBILI NOT CALCULATED   PT/INR No results found for this basename: LABPROT, INR,  in the last 72 hours Hepatitis Panel No results found for this basename: HEPBSAG, HCVAB, HEPAIGM, HEPBIGM,  in the last 72 hours C-Diff No results found for this basename: CDIFFTOX,  in the last 72 hours Fecal Lactopherrin No results found for this basename: FECLLACTOFRN,  in the last 72 hours  Studies/Results: Dg Chest 2 View  08/04/2013   CLINICAL DATA:  Chest pain and weakness.  EXAM: CHEST  2 VIEW  COMPARISON:  08/15/2011.  FINDINGS: Trachea is midline. Heart size stable. Thoracic aorta is calcified. Biapical pleural parenchymal scarring. There may be mild scarring at the left lung base. Lungs are otherwise clear. No pleural fluid.  IMPRESSION: No acute findings.   Electronically Signed   By: Leanna BattlesMelinda  Blietz M.D.   On: 08/04/2013 15:06   Nm Myocar Multi W/spect W/wall Motion / Ef  08/05/2013   EXAM: MYOCARDIAL IMAGING WITH SPECT (REST  AND PHARMACOLOGIC-STRESS)  GATED LEFT VENTRICULAR WALL MOTION STUDY  LEFT VENTRICULAR EJECTION FRACTION  TECHNIQUE: Standard myocardial SPECT imaging was performed after resting intravenous injection of 10 mCi Tc-2950m sestamibi. Subsequently, intravenous infusion of Lexiscan was performed under the supervision of the Cardiology staff. At peak effect of the drug, 30 mCi Tc-7550m sestamibi was injected intravenously and standard myocardial SPECT imaging was performed. Quantitative gated imaging was also performed to evaluate left ventricular wall motion, and estimate left ventricular ejection fraction.  COMPARISON:  None.  FINDINGS: The patient underwent Lexiscan Myoview stress testing under the supervision of the [facility] staff. The patient tolerated the procedure well. She did not experience any chest discomfort.the electrocardiogram at rest shows normal sinus rhythm with nonspecific T-wave changes. With stress, there are no new EKG changes to suggest ischemia.  The quality of the images is satisfactory. Perfusion imaging shows a Medium-sized defect of moderate severity involving the apical inferior, midinferior, basal inferior, and apical inferolateral, mid inferolateral, and basal inferolateral segments. There is no reversible ischemia.  The left ventricular end diastolic volume is 52 mL. the left ventricular end systolic volume is 28 mL. The left ventricular ejection fraction is 47%. There is mild inferior wall hypokinesis.  IMPRESSION: Abnormal Myoview stress test demonstrating old fixed inferior and inferolateral defect consistent with myocardial scar. There is no reversible ischemia.  There is mild left ventricular systolic dysfunction.   Electronically Signed   By: Cassell Clementhomas  Brackbill M.D.   On: 08/05/2013 14:48    Medications:  Scheduled: . amLODipine  5 mg Oral Daily  . aspirin  81 mg Oral Daily  . docusate sodium  100 mg Oral BID  . gabapentin  300 mg Oral QHS   And  . gabapentin  400 mg Oral QHS   . heparin  5,000 Units Subcutaneous 3 times per day  . lisinopril  30 mg Oral Daily  . methylPREDNISolone  4 mg Oral 3 x daily with food  . [START ON 08/07/2013] methylPREDNISolone  4 mg Oral 4X daily taper  . methylPREDNISolone  8 mg Oral Nightly  . methylPREDNISolone  8 mg Oral Nightly  . multivitamin with minerals  1 tablet Oral Daily  . pantoprazole  40 mg Oral BID  . prazosin  2 mg Oral BID   And  . prazosin  1 mg Oral BID  . propranolol ER  160 mg Oral Daily   Continuous:   Assessment/Plan: 1) Xyphoid process costochondritis. 2) Noncardiac chest pain.   She was not able to tolerate the first dosing of the Medrol Dose Pack.  I was not clear if she spontaneously vomited or if she vomited as a result of the medication.  She thinks that it was as a result of the medication.  I encouraged her to try it again.  She can be discharged home from the GI standpoint and I can follow up with her in the office in a month.  Plan: 1) Continue with the Medrol Dose Pack. 2) Follow up in one month.   LOS: 2 days   Theda Belfastatrick D Nyjah Schwake 08/06/2013, 8:53 AM

## 2013-08-06 NOTE — Progress Notes (Signed)
CSW (Clinical Social Worker) prepared pt dc packet and placed with shadow chart. CSW arranged non-emergent ambulance transport. Pt, pt family, pt nurse, and facility informed. CSW signing off.  Lekeisha Arenas, LCSWA [phone]  

## 2013-08-06 NOTE — Progress Notes (Signed)
Patient Name: Valerie BurnsMildred V Puertas Date of Encounter: 08/06/2013     Principal Problem:   Chest pain Active Problems:   Hypertension, accelerated    SUBJECTIVE  Patient feels better this am. Denies shortness of breath. Mildly tender in high epigastrium. BP still very labile.  CURRENT MEDS . amLODipine  5 mg Oral Daily  . aspirin  81 mg Oral Daily  . docusate sodium  100 mg Oral BID  . gabapentin  300 mg Oral QHS   And  . gabapentin  400 mg Oral QHS  . heparin  5,000 Units Subcutaneous 3 times per day  . lisinopril  30 mg Oral Daily  . methylPREDNISolone  4 mg Oral 3 x daily with food  . [START ON 08/07/2013] methylPREDNISolone  4 mg Oral 4X daily taper  . methylPREDNISolone  8 mg Oral Nightly  . methylPREDNISolone  8 mg Oral Nightly  . multivitamin with minerals  1 tablet Oral Daily  . pantoprazole  40 mg Oral BID  . prazosin  2 mg Oral BID   And  . prazosin  1 mg Oral BID  . propranolol ER  160 mg Oral Daily    OBJECTIVE  Filed Vitals:   08/05/13 1716 08/05/13 2115 08/06/13 0031 08/06/13 0641  BP: 135/76 156/131 184/91 120/59  Pulse: 83 111 85 67  Temp: 98.5 F (36.9 C) 98.7 F (37.1 C) 98.6 F (37 C) 98.1 F (36.7 C)  TempSrc: Oral Oral Oral Oral  Resp: 18 18 18 18   Height:      Weight:    124 lb 9.6 oz (56.518 kg)  SpO2: 95% 95% 96% 93%   No intake or output data in the 24 hours ending 08/06/13 0812 Filed Weights   08/05/13 0000 08/05/13 1548 08/06/13 0641  Weight: 126 lb 4.8 oz (57.289 kg) 131 lb 3.2 oz (59.512 kg) 124 lb 9.6 oz (56.518 kg)    PHYSICAL EXAM  General: Pleasant, NAD. Neuro: Alert and oriented X 3. Moves all extremities spontaneously. Psych: Normal affect. HEENT:  Normal  Neck: Supple without bruits or JVD. Lungs:  Resp regular and unlabored, CTA. Heart: RRR no s3, s4, or murmurs. Tender in xiphoid area. Abdomen: Soft, non-tender, non-distended, BS + x 4.  Extremities: No clubbing, cyanosis or edema. DP/PT/Radials 2+ and equal  bilaterally.  Accessory Clinical Findings  CBC  Recent Labs  08/04/13 1649  WBC 7.0  HGB 13.0  HCT 40.3  MCV 88.0  PLT 198   Basic Metabolic Panel  Recent Labs  08/04/13 1649  NA 141  K 4.2  CL 100  CO2 27  GLUCOSE 110*  BUN 16  CREATININE 0.62  CALCIUM 9.2   Liver Function Tests  Recent Labs  08/04/13 1649  AST 18  ALT 9  ALKPHOS 93  BILITOT 0.7  PROT 7.4  ALBUMIN 3.6    Recent Labs  08/04/13 1649  LIPASE 35   Cardiac Enzymes  Recent Labs  08/04/13 2125 08/05/13 0315 08/05/13 1005  TROPONINI <0.30 <0.30 <0.30   BNP No components found with this basename: POCBNP,  D-Dimer No results found for this basename: DDIMER,  in the last 72 hours Hemoglobin A1C No results found for this basename: HGBA1C,  in the last 72 hours Fasting Lipid Panel No results found for this basename: CHOL, HDL, LDLCALC, TRIG, CHOLHDL, LDLDIRECT,  in the last 72 hours Thyroid Function Tests No results found for this basename: TSH, T4TOTAL, FREET3, T3FREE, THYROIDAB,  in the last 72 hours  TELE  NSR  ECG  Sinus rhythm with 1st degree A-V block Nonspecific ST abnormality Abnormal ECG  Radiology/Studies  Dg Chest 2 View  08/04/2013   CLINICAL DATA:  Chest pain and weakness.  EXAM: CHEST  2 VIEW  COMPARISON:  08/15/2011.  FINDINGS: Trachea is midline. Heart size stable. Thoracic aorta is calcified. Biapical pleural parenchymal scarring. There may be mild scarring at the left lung base. Lungs are otherwise clear. No pleural fluid.  IMPRESSION: No acute findings.   Electronically Signed   By: Leanna BattlesMelinda  Blietz M.D.   On: 08/04/2013 15:06   Nm Myocar Multi W/spect W/wall Motion / Ef  08/05/2013   EXAM: MYOCARDIAL IMAGING WITH SPECT (REST AND PHARMACOLOGIC-STRESS)  GATED LEFT VENTRICULAR WALL MOTION STUDY  LEFT VENTRICULAR EJECTION FRACTION  TECHNIQUE: Standard myocardial SPECT imaging was performed after resting intravenous injection of 10 mCi Tc-8351m sestamibi.  Subsequently, intravenous infusion of Lexiscan was performed under the supervision of the Cardiology staff. At peak effect of the drug, 30 mCi Tc-4251m sestamibi was injected intravenously and standard myocardial SPECT imaging was performed. Quantitative gated imaging was also performed to evaluate left ventricular wall motion, and estimate left ventricular ejection fraction.  COMPARISON:  None.  FINDINGS: The patient underwent Lexiscan Myoview stress testing [facility]. The patient tolerated the procedure well. She did not experience any chest discomfort.the electrocardiogram at rest shows normal sinus rhythm with nonspecific T-wave changes. With stress, there are no new EKG changes to suggest ischemia.  The quality of the images is satisfactory. Perfusion imaging shows a Medium-sized defect of moderate severity involving the apical inferior, midinferior, basal inferior, and apical inferolateral, mid inferolateral, and basal inferolateral segments. There is no reversible ischemia.  The left ventricular end diastolic volume is 52 mL. the left ventricular end systolic volume is 28 mL. The left ventricular ejection fraction is 47%. There is mild inferior wall hypokinesis.  IMPRESSION: Abnormal Myoview stress test demonstrating old fixed inferior and inferolateral defect consistent with myocardial scar. There is no reversible ischemia. There is mild left ventricular systolic dysfunction.   Electronically Signed   By: Cassell Clementhomas  Donn Zanetti M.D.   On: 08/05/2013 14:48    ASSESSMENT AND PLAN Myoview shows old scar but no ischemia. No further cardiac workup indicated.  Okay for discharge back to nursing home from cardiac standpoint.   Signed, Cassell Clementhomas Login Muckleroy MD

## 2013-08-06 NOTE — Progress Notes (Signed)
CSW (Clinical Child psychotherapistocial Worker) called facility and notified of likely dc for today. Facility confirmed pt can transfer back today.   Dietra Stokely, LCSWA [phone]

## 2013-08-06 NOTE — Progress Notes (Signed)
Pt given first dose of 16mg  of prednisone. Approx 30min after dose given patient starts to vomit. Vomitus clear with undigested food. Pt given antiemetic. Pt refuses to take subsequent doses of prednisone. Will continue to monitor for bouts of nausea.

## 2013-08-11 ENCOUNTER — Non-Acute Institutional Stay (SKILLED_NURSING_FACILITY): Payer: PRIVATE HEALTH INSURANCE | Admitting: Internal Medicine

## 2013-08-11 DIAGNOSIS — R1319 Other dysphagia: Secondary | ICD-10-CM

## 2013-08-11 DIAGNOSIS — R079 Chest pain, unspecified: Secondary | ICD-10-CM

## 2013-08-16 NOTE — Progress Notes (Addendum)
Patient ID: [name], female   DOB: 1926/01/29, 80 y.o.   MRN: [id number]                 PROGRESS NOTE  DATE:  08/11/2013     [facility]  LEVEL OF CARE:   SNF   Acute Visit     CHIEF COMPLAINT:  Review of observational stay in the hospital.    HISTORY OF PRESENT ILLNESS:  I last saw [name] 10 days ago.  At that point in time, she was complaining of a sensation of food getting stuck in her lower sternal area.  Apparently, the next day she also [facility].  She does apparently have a history of esophageal reflux and a hiatal hernia.  I put her on a proton pump inhibitor in place of her Pepcid.    The next day, she apparently complained of chest pain.   She went to the hospital.  I think she was seen by Cardiology.  Her troponins were negative.  A Myoview scan was done which showed no reversible ischemia.  There was an old scar.    She was seen by Dr. Debroah LoopHeung of Gastroenterology.  He thought she had costochondritis.  I do not know that he picked up on the dysphagia history.  She was given a course of prednisone for costochondritis.    Since her return to the facility, she has undergone a FEES test.  I do not see that here.    REVIEW OF SYSTEMS:   CHEST/RESPIRATORY:  She is not currently complaining of shortness of breath.   CARDIAC:  She does not complain of chest pain or musculoskeletal pain.   GI:  Surprisingly, she is not complaining of dysphagia, odynophagia, nausea, vomiting, or abdominal pain.    PHYSICAL EXAMINATION:   GENERAL APPEARANCE:  The patient looks very stable.  Greeted me in a warm, friendly fashion.   CHEST/RESPIRATORY:  Clear air entry bilaterally.   CARDIOVASCULAR:   CARDIAC:  Heart sounds are normal.   No murmurs.  No gallops.   GASTROINTESTINAL:   LIVER/SPLEEN/KIDNEYS:  No liver, no spleen.  No tenderness.    ASSESSMENT/PLAN:  Complaints of dysphagia in a patient with gastroesophageal reflux.   I had increased her proton pump inhibitors.  She is not currently complaining of dysphagia, which was the original complaint.    Chest pain.  This ruled out for significant coronary artery disease.  She was felt to have costochondritis, but that was not the original complaint.    The patient seems stable at the moment.  Her blood work has been stable.  She has undergone the cardiac work-up as noted.    She is not currently complaining of reflux symptoms or dysphagia.  I would continue to be observant about this.  If this returns, I think she will need to see Dr. Debroah LoopHeung with a better history.

## 2013-08-19 ENCOUNTER — Other Ambulatory Visit (HOSPITAL_BASED_OUTPATIENT_CLINIC_OR_DEPARTMENT_OTHER): Payer: Self-pay | Admitting: Internal Medicine

## 2013-08-19 DIAGNOSIS — R1013 Epigastric pain: Secondary | ICD-10-CM

## 2013-08-20 ENCOUNTER — Ambulatory Visit (HOSPITAL_COMMUNITY)
Admission: RE | Admit: 2013-08-20 | Discharge: 2013-08-20 | Disposition: A | Discharge status: Home / Self Care | Payer: PRIVATE HEALTH INSURANCE | Source: Ambulatory Visit | Attending: Internal Medicine | Admitting: Internal Medicine

## 2013-08-20 DIAGNOSIS — R1013 Epigastric pain: Secondary | ICD-10-CM

## 2013-08-20 DIAGNOSIS — K7460 Unspecified cirrhosis of liver: Secondary | ICD-10-CM | POA: Insufficient documentation

## 2013-08-20 DIAGNOSIS — K219 Gastro-esophageal reflux disease without esophagitis: Secondary | ICD-10-CM | POA: Insufficient documentation

## 2013-08-20 DIAGNOSIS — K224 Dyskinesia of esophagus: Secondary | ICD-10-CM | POA: Insufficient documentation

## 2013-08-31 DIAGNOSIS — R079 Chest pain, unspecified: Secondary | ICD-10-CM

## 2013-08-31 DIAGNOSIS — R1319 Other dysphagia: Secondary | ICD-10-CM

## 2013-09-01 ENCOUNTER — Non-Acute Institutional Stay (SKILLED_NURSING_FACILITY): Payer: PRIVATE HEALTH INSURANCE | Admitting: Internal Medicine

## 2013-09-01 DIAGNOSIS — R079 Chest pain, unspecified: Secondary | ICD-10-CM

## 2013-09-01 DIAGNOSIS — R1319 Other dysphagia: Secondary | ICD-10-CM

## 2013-09-01 NOTE — Progress Notes (Signed)
Patient ID: [name], female   DOB: 18-Apr-1925, 80 y.o.   MRN: [id number]                 PROGRESS NOTE  DATE:  08/31/2013     FACILITY: [facility]  LEVEL OF CARE:   SNF   Acute Visit     CHIEF COMPLAINT:  Review of medical issues. [employer] visit for April.     HISTORY OF PRESENT ILLNESS:  I last saw [name] earlier this month.  At that point in time, she was complaining of a sensation of food getting stuck in her lower sternal area.  Apparently, the next day she also complained about this to the [facility] nurse practitioner.  She does apparently have a history of esophageal reflux and a hiatal hernia.  I put her on a proton pump inhibitor in place of her Pepcid.    The next day, she apparently complained of chest pain.   She went to the hospital.  I think she was seen by Cardiology.  Her troponins were negative.  A Myoview scan was done which showed no reversible ischemia.  There was an old scar.    She was seen by Dr. Debroah Loop of Gastroenterology.  He thought she had costochondritis.  I do not know that he picked up on the dysphagia history.  She was given a course of prednisone for costochondritis.    Since her return to the facility, she has undergone a FEES test. We sent her for an esophagram which was perfectly normal without evidence of a stricture  REVIEW OF SYSTEMS:   CHEST/RESPIRATORY:  She is not currently complaining of shortness of breath.   CARDIAC:  She does not complain of chest pain or musculoskeletal pain.   GI:  Surprisingly, she is not complaining of dysphagia, odynophagia, nausea, vomiting, or abdominal pain.    PHYSICAL EXAMINATION:  Vitals: Temperature 97.2-pulse 70-respirations 18-blood pressure 140/70-weight 133 pounds which is stable  GENERAL APPEARANCE:  The patient looks very stable.  Greeted me in a warm, friendly fashion.   CHEST/RESPIRATORY:  Clear air entry bilaterally.   CARDIOVASCULAR:   CARDIAC:  Heart sounds are normal.   No murmurs.  No  gallops.   GASTROINTESTINAL:   LIVER/SPLEEN/KIDNEYS:  No liver, no spleen.  No tenderness.    ASSESSMENT/PLAN:  Complaints of dysphagia in a patient with gastroesophageal reflux. Her esophagram was completely normal  Chest pain.  This ruled out for significant coronary artery disease.  She was felt to have costochondritis.   The patient seems stable at the moment.  Her blood work has been stable.  She has undergone the cardiac work-up as noted.    She is not currently complaining of reflux symptoms or dysphagia.  I would continue to be observant about this.

## 2013-09-06 ENCOUNTER — Ambulatory Visit: Payer: Medicare Other | Admitting: Gastroenterology

## 2013-09-20 ENCOUNTER — Non-Acute Institutional Stay (SKILLED_NURSING_FACILITY): Payer: PRIVATE HEALTH INSURANCE | Admitting: Internal Medicine

## 2013-09-20 DIAGNOSIS — K7460 Unspecified cirrhosis of liver: Secondary | ICD-10-CM

## 2013-09-20 DIAGNOSIS — I671 Cerebral aneurysm, nonruptured: Secondary | ICD-10-CM

## 2013-09-20 DIAGNOSIS — I10 Essential (primary) hypertension: Secondary | ICD-10-CM

## 2013-09-24 NOTE — Progress Notes (Addendum)
Patient ID: Valerie BurnsMildred V Brewer, female   DOB: Aug 25, 1925, 80 y.o.   MRN: [id number]                PROGRESS NOTE  DATE:  09/20/2013    [facility]    LEVEL OF CARE:   SNF   Routine Visit   CHIEF COMPLAINT:  Routine medical visit/[employer] visit for May.    HISTORY OF PRESENT ILLNESS:   This is a patient who has been in the building since June 2012.     She has a history of altered mental status and accelerated hypertension, although her blood pressure has been well controlled recently.    She has a history of an intracerebral aneurysm.    Last month, she complained of dysphagia and prompted a visit to the hospital with chest pain where she ruled out for cardiac cause, but was felt to have costochondritis.  I do not believe they really picked up on the dysphagia history.  Nevertheless, we did an esophagram on her on 08/20/2013 which was completely normal.    PAST MEDICAL HISTORY/PROBLEM LIST:  Chronic back pain.    Obstructive chronic bronchitis.    Cirrhosis.  I have previously reviewed this problem, but the documentation for this is fairly weak.    History of cerebral aneurysm, nonruptured.    History of hiatal hernia.    Hard of hearing.    Hypertension.    History of hypertensive encephalopathy, although this has not occurred since she has been here.     History of pacemaker.    DVT.    Emphysema.    Gastroesophageal reflux disease.    Nonalcoholic steatohepatitis.    Migraines.    Osteoarthritis of the lumbar spine.    PAST SURGICAL HISTORY:    Vaginal hysterectomy in the 1980s.     Knee arthroscopy in the 1970s.    Tympanoplasty on the left in the 1950s.    Myringotomy in 2013.    SOCIAL HISTORY:   CODE STATUS:  The patient is a Do Not Resuscitate.    CURRENT MEDICATIONS:   Medication list is reviewed.     Norvasc 5 mg q.d.    ASA  81 q.d.    Os-Cal 500/200, 1 p.o. b.i.d.     Colace 100 b.i.d.    Neurontin 700 q.h.s.      Prinivil 30 q.d.    DuoNebs q.6 p.r.n.  She has not used these recently.    Claritin 10 q.d.    Minipress 2 mg along with 1 mg, 3 mg b.i.d.    Inderal LA 160 mg daily.    Protonix 20 mg b.i.d.    REVIEW OF SYSTEMS:   GI:  The patient is not complaining currently of reflux symptoms, dysphagia, or odynophagia.  No abdominal pain.   CARDIAC:   No exertional chest pain.    MUSCULOSKELETAL:  Up walking with a walker.  She is pain-free.    PHYSICAL EXAMINATION:   VITAL SIGNS:   TEMPERATURE:  97.2.   PULSE:  70.   RESPIRATIONS:  18.   BLOOD PRESSURE:  140/70.   WEIGHT:  133 pounds.    CHEST/RESPIRATORY:  Clear air entry bilaterally.    CARDIOVASCULAR:  CARDIAC:   Heart sounds are normal.   EDEMA/VARICOSITIES:  No evidence of a DVT.   GASTROINTESTINAL:  LIVER/SPLEEN/KIDNEYS:  No liver, no spleen.  No tenderness.    ASSESSMENT/PLAN:  Hypertension.  This has been refractory in the past, but  has been well controlled here.  No recent issues.  Recent BUN and creatinine are normal.  This was on 08/04/2013.    Cirrhosis of the liver.  I am not sure about the documentation here.  Likely a nonalcoholic fatty liver.  However, I do not know that she actually has cirrhosis.    Left middle cerebral artery aneurysm.  She is not a candidate for aggressive evaluation of this.    Complaints of dysphagia last month.  Work-up was negative.  We are treating this with observation.  She is on b.i.d. proton pump inhibitors.

## 2013-10-11 ENCOUNTER — Non-Acute Institutional Stay (SKILLED_NURSING_FACILITY): Payer: PRIVATE HEALTH INSURANCE | Admitting: Internal Medicine

## 2013-10-11 DIAGNOSIS — J309 Allergic rhinitis, unspecified: Secondary | ICD-10-CM | POA: Insufficient documentation

## 2013-10-11 DIAGNOSIS — I10 Essential (primary) hypertension: Secondary | ICD-10-CM

## 2013-10-11 DIAGNOSIS — M810 Age-related osteoporosis without current pathological fracture: Secondary | ICD-10-CM

## 2013-10-11 DIAGNOSIS — J449 Chronic obstructive pulmonary disease, unspecified: Secondary | ICD-10-CM

## 2013-10-11 DIAGNOSIS — R1319 Other dysphagia: Secondary | ICD-10-CM

## 2013-10-11 NOTE — Progress Notes (Signed)
Patient ID: Valerie BurnsMildred V Brewer, female   DOB: [birth year], 80 y.o.   MRN: [id number]                 PROGRESS NOTE  DATE:  10/21/2013   [facility]    LEVEL OF CARE:   SNF   Routine Visit   CHIEF COMPLAINT:  Routine medical visit/[employer] visit for June.    HISTORY OF PRESENT ILLNESS:   This is a patient who has been in the building since June 2012.     She has a history of altered mental status and accelerated hypertension, although her blood pressure has been well controlled recently.    She has a history of an intracerebral aneurysm.    Two months ago, she complained of dysphagia and prompted a visit to the hospital with chest pain where she ruled out for cardiac cause, but was felt to have costochondritis.  I do not believe they really picked up on the dysphagia history.  Nevertheless, we did an esophagram on her on 08/20/2013 which was completely normal.    PAST MEDICAL HISTORY/PROBLEM LIST:  Chronic back pain.    Obstructive chronic bronchitis/COPD   Cirrhosis.  I have previously reviewed this problem, but the documentation for this is fairly weak.    History of cerebral aneurysm, nonruptured.    History of hiatal hernia.    Hard of hearing.    Hypertension.    History of hypertensive encephalopathy, although this has not occurred since she has been here.     History of pacemaker.    DVT.    Emphysema.    Gastroesophageal reflux disease.    Nonalcoholic steatohepatitis.    Migraines.    Osteoarthritis of the lumbar spine.    Allergic Rhinitis  PAST SURGICAL HISTORY:    Vaginal hysterectomy in the 1980s.     Knee arthroscopy in the 1970s.    Tympanoplasty on the left in the 1950s.    Myringotomy in 2013.    SOCIAL HISTORY:   CODE STATUS:  The patient is a Do Not Resuscitate.    CURRENT MEDICATIONS:   Medication list is reviewed.     Norvasc 5 mg q.d.    ASA  81 q.d.    Os-Cal 500/200, 1 p.o. b.i.d.     Colace 100 b.i.d.     Neurontin 700 q.h.s.     Prinivil 30 q.d.    DuoNebs q.6 p.r.n.  She has not used these recently.    Claritin 10 q.d.    Minipress 2 mg along with 1 mg, 3 mg b.i.d.    Inderal LA 160 mg daily.    Protonix 20 mg b.i.d.    REVIEW OF SYSTEMS:   GI:  The patient is not complaining currently of reflux symptoms, dysphagia, or odynophagia.  No abdominal pain.   CARDIAC:   No exertional chest pain.    MUSCULOSKELETAL:  Up walking with a walker.  She is pain-free.    PHYSICAL EXAMINATION:   VITAL SIGNS:   TEMPERATURE:  97.4  PULSE:  76.   RESPIRATIONS:  18.   BLOOD PRESSURE:  112/76   WEIGHT:  133 pounds.    CHEST/RESPIRATORY:  Clear air entry bilaterally.    CARDIOVASCULAR:  CARDIAC:   Heart sounds are normal.   EDEMA/VARICOSITIES:  No evidence of a DVT.   GASTROINTESTINAL:  LIVER/SPLEEN/KIDNEYS:  No liver, no spleen.  No tenderness.    ASSESSMENT/PLAN:  Hypertension.  This has been refractory in the  past, but has been well controlled here.  No recent issues.  Recent BUN and creatinine are normal.  This was on 08/04/2013.    Cirrhosis of the liver.  I am not sure about the documentation here.  Likely a nonalcoholic fatty liver.  However, I do not know that she actually has cirrhosis.    Left middle cerebral artery aneurysm.  She is not a candidate for aggressive evaluation of this.    Complaints of dysphagia 2 months ago.  Work-up was negative.  We are treating this with observation.  She is on b.i.d. proton pump inhibitors. Gi felt this was costochondritis although this does not take into account her complaints of dysphagia  Osteoporosis  Chronic obstructive lung disease. She is on duo nebs when necessary she is currently asymptomatic

## 2013-12-06 ENCOUNTER — Non-Acute Institutional Stay (SKILLED_NURSING_FACILITY): Payer: PRIVATE HEALTH INSURANCE | Admitting: Internal Medicine

## 2013-12-06 DIAGNOSIS — J449 Chronic obstructive pulmonary disease, unspecified: Secondary | ICD-10-CM

## 2013-12-06 DIAGNOSIS — I10 Essential (primary) hypertension: Secondary | ICD-10-CM

## 2013-12-06 NOTE — Progress Notes (Signed)
Patient ID: [name], female   DOB: [birth year], 80 y.o.   MRN: [id number]                  PROGRESS NOTE  DATE:  12/06/2013  [facility]    LEVEL OF CARE:   SNF   Routine Visit   CHIEF COMPLAINT:  Routine medical visit/[employer] visit for July    HISTORY OF PRESENT ILLNESS:   This is a patient who has been in the building since June 2012.     She has a history of altered mental status and accelerated hypertension, although her blood pressure has been well controlled recently.    She has a history of an intracerebral aneurysm.    Three  months ago, she complained of dysphagia and prompted a visit to the hospital with chest pain where she ruled out for cardiac cause, but was felt to have costochondritis.  I do not believe they really picked up on the dysphagia history.  Nevertheless, we did an esophagram on her on 08/20/2013 which was completely normal.    She has since done well with no recent issues. Ambulates in the faciltiy with her walker.   PAST MEDICAL HISTORY/PROBLEM LIST:  Chronic back pain.    Obstructive chronic bronchitis/COPD   Cirrhosis.  I have previously reviewed this problem, but the documentation for this is fairly weak.    History of cerebral aneurysm, nonruptured.    History of hiatal hernia.    Hard of hearing.    Hypertension.    History of hypertensive encephalopathy, although this has not occurred since she has been here.     History of pacemaker.    DVT.    Emphysema.    Gastroesophageal reflux disease.    Nonalcoholic steatohepatitis.    Migraines.    Osteoarthritis of the lumbar spine.    Allergic Rhinitis  PAST SURGICAL HISTORY:    Vaginal hysterectomy in the 1980s.     Knee arthroscopy in the 1970s.    Tympanoplasty on the left in the 1950s.    Myringotomy in 2013.    SOCIAL HISTORY:   CODE STATUS:  The patient is a Do Not Resuscitate.    CURRENT MEDICATIONS:   Medication list is reviewed.      Norvasc 5 mg q.d.    ASA  81 q.d.    Os-Cal 500/200, 1 p.o. b.i.d.     Colace 100 b.i.d.    Neurontin 700 q.h.s.     Prinivil 30 q.d.    DuoNebs q.6 p.r.n.  She has not used these recently.    Claritin 10 q.d.    Minipress 2 mg along with 1 mg, 3 mg b.i.d.    Inderal LA 160 mg daily.    Protonix 20 mg b.i.d.    REVIEW OF SYSTEMS:   GI:  The patient is not complaining currently of reflux symptoms, dysphagia, or odynophagia.  No abdominal pain.   CARDIAC:   No exertional chest pain.    MUSCULOSKELETAL:  Up walking with a walker.  She is pain-free.    PHYSICAL EXAMINATION:   VITAL SIGNS:   TEMPERATURE:  97.4  PULSE:  80.   RESPIRATIONS:  18.   BLOOD PRESSURE:  120/70  WEIGHT:  129 pounds.    CHEST/RESPIRATORY:  Clear air entry bilaterally.    CARDIOVASCULAR:  CARDIAC:   Heart sounds are normal.   EDEMA/VARICOSITIES:  No evidence of a DVT.   GASTROINTESTINAL:  LIVER/SPLEEN/KIDNEYS:  No liver,  no spleen.  No tenderness.    ASSESSMENT/PLAN:  Hypertension.  This has been refractory in the past, but has been well controlled here.  No recent issues.  Recent BUN and creatinine are normal.  This was on 08/04/2013.    Cirrhosis of the liver.  I am not sure about the documentation here.  Likely a nonalcoholic fatty liver.  However, I do not know that she actually has cirrhosis.    Left middle cerebral artery aneurysm.  She is not a candidate for aggressive evaluation of this.    Complaints of dysphagia 2 months ago.  Work-up was negative.  We are treating this with observation.  She is on b.i.d. proton pump inhibitors. Gi felt this was costochondritis although this does not take into account her complaints of dysphagia  Osteoporosis  Chronic obstructive lung disease. She is on duo nebs when necessary she is currently asymptomatic

## 2014-02-26 ENCOUNTER — Non-Acute Institutional Stay (SKILLED_NURSING_FACILITY): Payer: PRIVATE HEALTH INSURANCE | Admitting: Internal Medicine

## 2014-02-26 DIAGNOSIS — I671 Cerebral aneurysm, nonruptured: Secondary | ICD-10-CM

## 2014-02-26 DIAGNOSIS — I10 Essential (primary) hypertension: Secondary | ICD-10-CM

## 2014-03-01 NOTE — Progress Notes (Addendum)
Patient ID: Valerie BurnsMildred V Brewer, female   DOB: 1926-02-21, 80 y.o.   MRN: [id number]              PROGRESS NOTE  DATE:  02/26/2014    [facility]    LEVEL OF CARE:   SNF   Routine Visit   CHIEF COMPLAINT:  Routine medical visit, [employer] visit.    HISTORY OF PRESENT ILLNESS:  This is a patient who has been in the building since June 2012.  She has a history of altered mental status secondary to accelerated hypertension prior to her arrival here, although her blood pressures have been very stable.  In fact, recently we have noted some actual low blood pressures.    She has a history of an intracerebral aneurysm.    Earlier this year, she developed dysphagia which prompted a trip to the hospital with chest pain.  She was ultimately felt to have costochondritis.  The dysphagia history was not really picked up on.  We did an esophagram on her in 08/2013 which was completely normal.  Since then, she has had no recent issues although she is complaining of low blood pressure/perhaps syncopal episode.    LABORATORY DATA:  Lab work on 02/08/2014 revealed a hemoglobin A1c of 5.4.    Her CBC was essentially normal.  Hemoglobin 11.3.    Comprehensive metabolic panel was normal other than an albumin of 2.8.    PHYSICAL EXAMINATION:   VITAL SIGNS:   BLOOD PRESSURE:  BP is listed at 98/56.   PULSE:  113.   CHEST/RESPIRATORY:  Clear air entry bilaterally.   CARDIOVASCULAR:  CARDIAC:   Heart sounds are normal.   GASTROINTESTINAL:  ABDOMEN:   Soft.    ASSESSMENT/PLAN:    Cerebral aneurysm, nonruptured.  Blood pressure is well controlled.  She continues on aspirin.  She is on a combination of Inderal, lisinopril, Minipress, and Norvasc.    Hypertensive heart disease without heart failure.  We will need to monitor the low blood pressures.  Nevertheless, with the left middle cerebral aneurysm, we will need to control her blood pressures.  She is certainly not a candidate for this.     Complaints of dysphagia in the spring of this year.  Work-up was negative.  I have not heard about this since.    COPD.  DuoNebs p.r.n.  She is not complaining of this.    Osteoporosis.

## 2014-05-02 ENCOUNTER — Non-Acute Institutional Stay (SKILLED_NURSING_FACILITY): Payer: Medicare Other | Admitting: Internal Medicine

## 2014-05-02 DIAGNOSIS — I671 Cerebral aneurysm, nonruptured: Secondary | ICD-10-CM

## 2014-05-02 DIAGNOSIS — I10 Essential (primary) hypertension: Secondary | ICD-10-CM

## 2014-05-05 NOTE — Progress Notes (Addendum)
Patient ID: Valerie BurnsMildred V Brewer, female   DOB: 24-Jul-1925, 81 y.o.   MRN: [id number]              PROGRESS NOTE  DATE:  05/02/2014             [facility]    LEVEL OF CARE:   SNF   Routine Visit   CHIEF COMPLAINT:  Routine medical visit/[employer] visit.    HISTORY OF PRESENT ILLNESS:  This is a patient who has been in the building since June 2012.    She has a history of an intracerebral aneurysm and refractory hypertension/accelerated hypertension years ago, although this has been well controlled in the facility.    In May of last year, she developed complaints of dysphagia and chest pain.  She was ultimately felt by GI to have costochondritis.  However, the dysphagia history was never really picked up on.  We did an echocardiogram on her, which was completely normal.    The patient has not had any recent concerns.  She has had no chest pain.    LABORATORY DATA:  Recent lab work shows a basic metabolic panel that was normal on 02/24/2014.    Also in November, her hemoglobin A1c was 5.4.    Comprehensive metabolic panel was normal other than an albumin of 2.8.    Her CBC shows a hemoglobin of 11.3.    CURRENT MEDICATIONS:  Medication list is reviewed.     Propranolol ER 160 mg daily.    Norvasc 5 q.d.    ASA 81 q.d.     Lisinopril 30 q.d.     Protonix 20 q.d.    Prazosin 3 mg b.i.d.     Colace 100 b.i.d.     Tums antacid 500 chew, 1 tablet twice daily.    Neurontin 700 mg at bedtime.    Vitamin D3, 50,000 U monthly.      PHYSICAL EXAMINATION:   VITAL SIGNS:   BLOOD PRESSURE:  110/58.   PULSE:  64.   RESPIRATIONS:  18.   O2 SATURATIONS:  96% on room air.   CHEST/RESPIRATORY:  Exam is clear.   CARDIOVASCULAR:  CARDIAC:   Heart sounds are normal.  There are no murmurs.   GASTROINTESTINAL:  ABDOMEN:   Soft, nontender.    ASSESSMENT/PLAN:                  Hypertension.  Her blood pressure is well controlled.  She continues on aspirin and is on a  combination of Inderal, lisinopril, Minipress, and Norvasc.    Hypertensive heart disease without heart failure.  This appears to be under control.  She had an echo earlier last year that was really unremarkable.    History of a left middle cerebral artery aneurysm.  This is not symptomatic.  She is not a surgical candidate.  Fortunately, she is asymptomatic.

## 2014-06-04 ENCOUNTER — Non-Acute Institutional Stay (SKILLED_NURSING_FACILITY): Payer: Medicare Other | Admitting: Internal Medicine

## 2014-06-04 DIAGNOSIS — I671 Cerebral aneurysm, nonruptured: Secondary | ICD-10-CM | POA: Diagnosis not present

## 2014-06-04 DIAGNOSIS — I10 Essential (primary) hypertension: Secondary | ICD-10-CM

## 2014-06-04 NOTE — Progress Notes (Signed)
Patient ID: Valerie BurnsMildred V Brewer, female   DOB: [birth year], 81 y.o.   MRN: [id number]              PROGRESS NOTE  DATE:  06/04/2014          [facility]    LEVEL OF CARE:   SNF   Routine Visit   CHIEF COMPLAINT:  Routine medical visit/[employer] visit.    HISTORY OF PRESENT ILLNESS:  This is a patient who has been in the building since June 2012.    She has a history of an intracerebral aneurysm and refractory hypertension/accelerated hypertension years ago, although this has been well controlled in the facility.    In May of last year, she developed complaints of dysphagia and chest pain.  She was ultimately felt by GI to have costochondritis.  However, the dysphagia history was never really picked up on.  We did an echocardiogram on her, which was completely normal.    The patient has not had any recent concerns.  She has had no chest pain.      LABORATORY DATA:  Recent lab work shows a basic metabolic panel that was normal on 02/24/2014.    Also in November, her hemoglobin A1c was 5.4.    Comprehensive metabolic panel was normal other than an albumin of 2.8.    Her CBC shows a hemoglobin of 11.3.    CURRENT MEDICATIONS:  Medication list is reviewed.     Propranolol ER 160 mg daily.    Norvasc 5 q.d.    ASA 81 q.d.     Lisinopril 30 q.d.     Protonix 20 q.d.    Prazosin 3 mg b.i.d.     Colace 100 b.i.d.     Tums antacid 500 chew, 1 tablet twice daily.    Neurontin 700 mg at bedtime.    Vitamin D3, 50,000 U monthly.      Social; I have reviewed the patient's advanced care plan from 05/08/2014 she is a DO NOT RESUSCITATE but otherwise seems to wish aggressive care  PHYSICAL EXAMINATION:   VITAL SIGNS:   BLOOD PRESSURE:  118/64  PULSE:  64.   RESPIRATIONS:  18.   O2 SATURATIONS:  96% on room air.   CHEST/RESPIRATORY:  Exam is clear.   CARDIOVASCULAR:  CARDIAC:   Heart sounds are normal.  There are no murmurs.   GASTROINTESTINAL:  ABDOMEN:   Soft,  nontender.    ASSESSMENT/PLAN:                  Hypertension.  Her blood pressure is well controlled.  She continues on aspirin and is on a combination of Inderal, lisinopril, Minipress, and Norvasc.    Hypertensive heart disease without heart failure.  This appears to be under control.  She had an echo earlier last year that was really unremarkable.    History of a left middle cerebral artery aneurysm.  This is not symptomatic.  She is not a surgical candidate.  Fortunately, she is asymptomatic.

## 2014-06-20 ENCOUNTER — Encounter: Payer: Self-pay | Admitting: Internal Medicine

## 2014-06-20 ENCOUNTER — Non-Acute Institutional Stay (SKILLED_NURSING_FACILITY): Payer: Medicare Other | Admitting: Internal Medicine

## 2014-06-20 DIAGNOSIS — R112 Nausea with vomiting, unspecified: Secondary | ICD-10-CM

## 2014-06-23 NOTE — Progress Notes (Addendum)
Patient ID: Valerie BurnsMildred V Brewer, female   DOB: 1926-02-14, 81 y.o.   MRN: [id number]                PROGRESS NOTE  DATE:  06/20/2014              [facility]                           LEVEL OF CARE:   SNF   Acute Visit                         CHIEF COMPLAINT:  Postprandial vomiting.     HISTORY OF PRESENT ILLNESS:  Apparently over the weekend, the patient vomited after eating a meal.  Her Protonix was increased to 20 b.i.d.  I am looking at her today because of this.    REVIEW OF SYSTEMS:    CHEST/RESPIRATORY:  She is not complaining of shortness of breath.   CARDIAC:  No clear chest pain.   GI:  No abdominal pain.  The patient states that "Dr. Elana AlmMcPhail took out everything when I turned 67".  She does not think she has a gallbladder or an appendix.    PHYSICAL EXAMINATION:   GENERAL APPEARANCE:  The patient looks in no distress, although I see that she has not eaten any of her dinner.   CHEST/RESPIRATORY:  Clear air entry bilaterally.    CARDIOVASCULAR:   CARDIAC:  Heart sounds are normal.  There are no murmurs.    GASTROINTESTINAL:   ABDOMEN:  I see really no surgical scars.  Her abdomen is nondistended, soft, and nontender.  There is no right upper quadrant tenderness.  Again, I do not see any relevant surgical scars.    LABORATORY DATA:  Her most recent lab work in November 2015 showed a white count of 6.2 with a normal differential.  Her hemoglobin was 11.3.    Comprehensive metabolic panel was normal other than an albumin of 2.8.    ASSESSMENT/PLAN:                            Postprandial vomiting.  I do not see any reference to a cholecystectomy or an appendectomy.  She has had a vaginal hysterectomy.    I think the correct approach here would be to repeat her lab work.  At this point, I probably would not ultrasound her unless this becomes a recurrent problem.  I am concerned about the lack of oral intake, however.

## 2014-07-27 ENCOUNTER — Non-Acute Institutional Stay (SKILLED_NURSING_FACILITY): Payer: Medicare Other | Admitting: Internal Medicine

## 2014-07-27 DIAGNOSIS — I671 Cerebral aneurysm, nonruptured: Secondary | ICD-10-CM

## 2014-07-27 DIAGNOSIS — I10 Essential (primary) hypertension: Secondary | ICD-10-CM | POA: Diagnosis not present

## 2014-08-01 NOTE — Progress Notes (Addendum)
Patient ID: [name], female   DOB: 28-Apr-1925, 81 y.o.   MRN: [id number]                PROGRESS NOTE  DATE:  07/27/2014         [facility]AC[facility]       LEVEL OF CARE:   SNF   Routine Visit                   CHIEF COMPLAINT:  Routine medical visit/[facility] visit.      HISTORY OF PRESENT ILLNESS:  This is a patient who has been in the building since 2012.     Her major medical issues are hypertension, which at one point was really quite refractory.  However, it has been very stable since her admission here.    She has a history of a left middle cerebral artery aneurysm.  However, she is not symptomatic and has not been a surgical candidate.     She had an episode of recurrent hematemesis last month.  Her lab work at that time was normal.  White count was 5.8, hemoglobin 12.7, platelet count 197.  Her comprehensive metabolic panel was completely normal including a sodium of 140, potassium of 4.4, BUN of 16, creatinine of 0.72.  Her glucose, however, was slightly elevated at 132 on a random specimen.    SOCIAL HISTORY:             ADVANCED DIRECTIVES:  The patient is a DNR, but there are no limitations to the aggressiveness of her care.    CURRENT MEDICATIONS:  Medication list is reviewed.                  Propranolol ER 160 daily.    Norvasc 5 q.d.        ASA 81 q.d.       Lisinopril 30 q.d.       Prazosin 3 mg, 1 tablet twice daily.     Colace 100 b.i.d.       Protonix 20 b.i.d.       Tums 2 tablets b.i.d.       Neurontin 700 at bedtime.     Vitamin D3, 50,000 U monthly.      PHYSICAL EXAMINATION:   VITAL SIGNS:   BLOOD PRESSURE:  Recent blood pressures in the 130-140 range systolic, mostly in the 80s range diastolic.    CHEST/RESPIRATORY:  Clear air entry bilaterally.    CARDIOVASCULAR:   CARDIAC:  Heart sounds are normal.   GASTROINTESTINAL:   ABDOMEN:  No masses.         LIVER/SPLEEN/KIDNEYS:  No liver, no spleen.    ASSESSMENT/PLAN:                   Cerebral aneurysm.  Asymptomatic and unruptured.    Hypertension, which at one point had severe manifestations.  This has been well managed and is stable, albeit on an aggressive regimen.     Gastroesophageal reflux disease.  She is on Protonix.

## 2014-08-31 ENCOUNTER — Non-Acute Institutional Stay (SKILLED_NURSING_FACILITY): Payer: Medicare Other | Admitting: Internal Medicine

## 2014-08-31 DIAGNOSIS — I671 Cerebral aneurysm, nonruptured: Secondary | ICD-10-CM

## 2014-08-31 DIAGNOSIS — I10 Essential (primary) hypertension: Secondary | ICD-10-CM

## 2014-08-31 DIAGNOSIS — M810 Age-related osteoporosis without current pathological fracture: Secondary | ICD-10-CM

## 2014-09-06 NOTE — Progress Notes (Signed)
Patient ID: Dellie BurnsMildred V Frick, female   DOB: 20-May-1925, 81 y.o.   MRN: [id number]                PROGRESS NOTE  DATE:  08/31/2014        [facility]AC[facility]                     LEVEL OF CARE:   SNF   Routine Visit               CHIEF COMPLAINT:  Routine medical visit/[facility] visit.        HISTORY OF PRESENT ILLNESS:  This is a patient who has been in the building since 2012.    Her major medical issues include:      Hypertension which at one point, per description of her daughter, was a very significant issue including multiple episodes of accelerated hypertension.    History of a left middle cerebral artery aneurysm.    However, she is not symptomatic and has not been a repair candidate.    Her blood pressure has been under reasonably good control, by review of the records.  Last month, a value of 140/80.  This month, a value of 126/70.    Other than this, she has been a reasonably stable patient in the facility.    CURRENT MEDICATIONS:  Medication list is reviewed.                Tums 500, 2 tablets twice a day.    Neurontin 700 q.h.s.       Vitamin D3, 50,000 U monthly.      Catapres 0.1 every 6 hours as needed for systolic blood pressure greater than 170.     DuoNebs p.r.n.     Ultram 50 mg q.6 p.r.n.       Multivitamin daily.    Propranolol ER 160, 1 daily.    Norvasc 5 q.d.       ASA 81 q.d.       Zestril 30 q.d.       Minipress 3 b.i.d.       Colace 100 b.i.d.        Protonix 20 b.i.d.        LABORATORY DATA:   Recent lab work from 06/21/2014 showed:     CBC with differential:  Normal.    Comprehensive metabolic panel:  Completely normal with BUN at 16, creatinine of 0.72, potassium of 4.4.      PHYSICAL EXAMINATION:   VITAL SIGNS:     BLOOD PRESSURE:  As noted, under good control.    CARDIOVASCULAR:   CARDIAC:  Heart sounds are normal.  There are no murmurs.   No bruits.     GASTROINTESTINAL:   ABDOMEN:  Soft.  No masses.         ASSESSMENT/PLAN:                        Hypertension, which at one point was really quite severe, although she has had no recent problems with this in quite a while, albeit on an aggressive regimen.      Cerebral aneurysm.  Asymptomatic and unruptured.     Gastroesophageal reflux disease.  On Protonix.    History of allergic rhinitis.  This has been stable.    Possible COPD.  This is not currently symptomatic.

## 2014-09-28 ENCOUNTER — Non-Acute Institutional Stay (SKILLED_NURSING_FACILITY): Payer: Medicare Other | Admitting: Internal Medicine

## 2014-09-28 DIAGNOSIS — I10 Essential (primary) hypertension: Secondary | ICD-10-CM

## 2014-09-28 DIAGNOSIS — I671 Cerebral aneurysm, nonruptured: Secondary | ICD-10-CM | POA: Diagnosis not present

## 2014-10-03 NOTE — Progress Notes (Addendum)
Patient ID: Valerie BurnsMildred V Brewer, female   DOB: 01/20/1926, 81 y.o.   MRN: [id number]                PROGRESS NOTE  DATE:  09/28/2014         FACILITY[facility]                           LEVEL OF CARE:   SNF   Routine Visit               CHIEF COMPLAINT:  Routine medical visit/[facility] visit.      HISTORY OF PRESENT ILLNESS:  This is a patient who has been in the building since 2012.    Her major issues include hypertension which at one point, per description of her daughter, was a very significant issue including episodes of what sounded like accelerated hypertension.    She also has a history of a left middle cerebral artery aneurysm which has not ruptured.    She was seen by ENT last month and diagnosed with a right middle ear effusion.  She had a myringotomy tube placed.    Her blood pressure has been under good control in the facility, generally in the 130-140 and 70 range.    PAST MEDICAL HISTORY/PROBLEM LIST:          Nonruptured left middle cerebral artery aneurysm.    History of hiatal hernia.     Hard of hearing.    Hypertension.    Hypertensive encephalopathy.    Pacemaker.    Right DVT in the 1970s.    COPD.    Sleep apnea.    Gastroesophageal reflux disease.    Nonalcoholic steatohepatitis.    Migraines.    Osteoarthritis of the lumbar spine.    History of shingles.    PAST SURGICAL HISTORY:    Hysterectomy.    Knee arthroscopy in the 1970s.    Myringotomy in May 2013.    D&C.     CURRENT MEDICATIONS:  Medication list is reviewed.               Multivitamin.    Propranolol ER 160 mg daily.    Norvasc 5 mg daily.    ASA 81 q.d.       Zestril 30 q.d.      Prazosin 3 mg b.i.d.      Colace 100 b.i.d.      Protonix 20 b.i.d.      Neurontin 700 q.h.s.       Vitamin D3, 50,000 U monthly.    PHYSICAL EXAMINATION:   CHEST/RESPIRATORY:  Clear air entry bilaterally.    CARDIOVASCULAR:   CARDIAC:  Heart sounds are normal.   There are no murmurs.    GASTROINTESTINAL:   ABDOMEN:  Soft, nontender.      ASSESSMENT/PLAN:                      Hypertension, which at one point was severe.  This has been under very good control here and we really have had no issues.    Peripheral vascular disease.  She is on aspirin.    Nonruptured cerebral aneurysm.  She is not a candidate to have this repaired.  She is a No Code in the facility.

## 2014-10-30 ENCOUNTER — Non-Acute Institutional Stay (SKILLED_NURSING_FACILITY): Payer: Medicare Other | Admitting: Internal Medicine

## 2014-10-30 DIAGNOSIS — I671 Cerebral aneurysm, nonruptured: Secondary | ICD-10-CM

## 2014-10-30 DIAGNOSIS — I10 Essential (primary) hypertension: Secondary | ICD-10-CM | POA: Diagnosis not present

## 2014-10-30 DIAGNOSIS — K7581 Nonalcoholic steatohepatitis (NASH): Secondary | ICD-10-CM

## 2014-11-03 NOTE — Progress Notes (Addendum)
Patient ID: [name], female   DOB: March 16, 1926, 81 y.o.   MRN: [id number]                PROGRESS NOTE  DATE:  10/30/2014              [facility]AC[facility]                      LEVEL OF CARE:   SNF   Routine Visit                     CHIEF COMPLAINT:  Routine medical review/[facility] visit.      HISTORY OF PRESENT ILLNESS:  [name] has been in the building since 2012.    Her major issues prior to her arrival here included what sounds like accelerated hypertension, although that has been very well controlled here.    She also has a history of a left middle cerebral artery aneurysm which has been asymptomatic and not ruptured.    She had a myringotomy tube placed in May for a right middle ear effusion.    As usual, she has excellent blood pressure control.    PAST MEDICAL HISTORY/PROBLEM LIST:                     Non-ruptured left middle cerebral artery aneurysm.    History of hiatal hernia.    Hearing loss.    Hypertension.    Hypertensive encephalopathy.    Pacemaker.     Right DVT in the 1970s.    COPD.     Sleep apnea.    Gastroesophageal reflux disease.    Non-alcoholic steatohepatitis.    Migraines.    Osteoarthritis of the lumbar spine.    History of shingles.     PAST SURGICAL HISTORY:               Hysterectomy.     Knee arthroscopy in the 1970s.     Myringotomy in May 2013.    D&C.    CURRENT MEDICATIONS:  Medication list is reviewed.       Propanolol 160 ER once daily.     Norvasc 5 q.d.    ASA 81 q.d.     Zestril 30 q.d.      Prazosin 20 b.i.d.       Minipress 1 tablet twice daily.       Colace 100, 1 capsule twice daily.      Protonix 20 q.d.     Neurontin 700 at bedtime.    Vitamin D3, 50,000 U monthly.    PHYSICAL EXAMINATION:   CHEST/RESPIRATORY:  Clear air entry bilaterally.    CARDIOVASCULAR:   CARDIAC:  Heart sounds are normal.  There are no murmurs.    GASTROINTESTINAL:   ABDOMEN:  Soft,  nontender.  No masses.     LIVER/SPLEEN/KIDNEYS:  No liver, no spleen.   PSYCHIATRIC:   MENTAL STATUS:  She is really quite pleasant.      ASSESSMENT/PLAN:              Hypertension, which at one point was severe.  ?Accelerated.  However, this has been under excellent control here, currently today at 134/80.     PVD.  She is on aspirin.    Non-ruptured cerebral aneurysm.  She is not a candidate to have this repaired.    Non-alcoholic steatohepatitis.  Liver function tests were normal in March.   I  am not really sure how this was originally identified.  I do not see anything about this in [facility].  She is actually currently asymptomatic.

## 2014-11-04 ENCOUNTER — Other Ambulatory Visit: Payer: Self-pay | Admitting: Internal Medicine

## 2014-11-04 DIAGNOSIS — I671 Cerebral aneurysm, nonruptured: Secondary | ICD-10-CM

## 2014-11-04 DIAGNOSIS — R440 Auditory hallucinations: Secondary | ICD-10-CM

## 2014-11-08 ENCOUNTER — Ambulatory Visit (HOSPITAL_COMMUNITY)
Admission: RE | Admit: 2014-11-08 | Discharge: 2014-11-08 | Disposition: A | Discharge status: Home / Self Care | Payer: Medicare Other | Source: Ambulatory Visit | Attending: Internal Medicine | Admitting: Internal Medicine

## 2014-11-08 DIAGNOSIS — G319 Degenerative disease of nervous system, unspecified: Secondary | ICD-10-CM | POA: Diagnosis not present

## 2014-11-08 DIAGNOSIS — J323 Chronic sphenoidal sinusitis: Secondary | ICD-10-CM | POA: Diagnosis not present

## 2014-11-08 DIAGNOSIS — I671 Cerebral aneurysm, nonruptured: Secondary | ICD-10-CM

## 2014-11-08 DIAGNOSIS — I670 Dissection of cerebral arteries, nonruptured: Secondary | ICD-10-CM | POA: Insufficient documentation

## 2014-11-08 DIAGNOSIS — R440 Auditory hallucinations: Secondary | ICD-10-CM | POA: Insufficient documentation

## 2014-12-19 ENCOUNTER — Non-Acute Institutional Stay (SKILLED_NURSING_FACILITY): Payer: Medicare Other | Admitting: Internal Medicine

## 2014-12-19 DIAGNOSIS — F22 Delusional disorders: Secondary | ICD-10-CM

## 2014-12-19 DIAGNOSIS — K7581 Nonalcoholic steatohepatitis (NASH): Secondary | ICD-10-CM

## 2014-12-19 DIAGNOSIS — I671 Cerebral aneurysm, nonruptured: Secondary | ICD-10-CM | POA: Diagnosis not present

## 2014-12-19 DIAGNOSIS — I10 Essential (primary) hypertension: Secondary | ICD-10-CM

## 2014-12-24 NOTE — Progress Notes (Deleted)
Patient ID: [name], female   DOB: [birth year], 81 y.o.   MRN: [id number]                PROGRESS NOTE  DATE:  12/19/2014           FACILITY: [facility]                     LEVEL OF CARE:   SNF   Routine Visit               CHIEF COMPLAINT:  Routine medical visit/[facility] visit/review of records   HISTORY OF PRESENT ILLNESS:  This is a patient who came to the building in 2012.    Her major premorbid issues were accelerated hypertension, although that has not been a problem here.    She also has a history of a left middle cerebral artery aneurysm which has been asymptomatic.     She had a myringotomy tube placed earlier this year for right middle ear effusion.     Last month in the early part of August, she developed delusional thought including fears that she could hear voices in her room or that things were talking to her through the wall.  This was discussed with me over the phone.  She went on to have a CT scan of the head that showed some interval progression of small vessel disease throughout the periventricular white matter.  However, no acute issues were seen.     There was concern raised about her underlying non-alcoholic steatohepatitis.  She went on to have an ammonia level which was 71, certainly not elevated.      It would appear that the delusional thought has been controlled.   She apparently would not take lactulose.  Psychiatry has started her on a small dose of Xanax.       LABORATORY DATA/RADIOLOGY:   TSH was 1.120.    Her white count  was 6.2, hemoglobin 12, platelet count 233.  Differential count was normal.    Basic metabolic panel showed a glucose of 157, BUN 17, creatinine 0.84.     Liver function tests were normal.    Albumin was 3.7.    An abdominal ultrasound was done that showed non-visualization of the pancreas; otherwise, unremarkable.    REVIEW OF SYSTEMS:    HEENT:   No headache or dizziness.   CHEST/RESPIRATORY:  No shortness of  breath.   CARDIAC:  No chest pain.   EDEMA/VARICOSITIES:  Extremities:  No swelling.    GI:  Apparently, her appetite is about 50-75%.     GU:  No dysuria.    MUSCULOSKELETAL:  She is complaining of low back pain.           PSYCHIATRIC:   I do not seem to really recall the significance of her problems last month.  Denies depression.     PHYSICAL EXAMINATION:   VITAL SIGNS:     TEMPERATURE:  97.5.     PULSE:  72.      RESPIRATIONS:  18.      BLOOD PRESSURE:  124/62.     WEIGHT:  134 pounds.   This has largely been stable for the last year.   GENERAL APPEARANCE:  The patient is not in any distress.           HEENT:   EYES:  Pupils equal and reactive.   MOUTH/THROAT:  Oral exam is normal.     CHEST/RESPIRATORY:  Exam shows clear air entry bilaterally.     CARDIOVASCULAR:   CARDIAC:  Heart sounds are normal.  There are no murmurs.    GASTROINTESTINAL:   LIVER/SPLEEN/KIDNEYS:  No liver, no spleen.  No tenderness.   No asterixis.     GENITOURINARY:   BLADDER:  No suprapubic pain or fullness.  No CVA tenderness.     CIRCULATION:   EDEMA/VARICOSITIES:  Extremities:  No edema.     NEUROLOGICAL:    SENSATION/STRENGTH:  She is able to move all her limbs.  Equal strength bilaterally.  No asterixis PSYCHIATRIC:   MENTAL STATUS:  I see no evidence of the delusional thought she exhibited.  Psychiatry has labeled this as a delusional disorder.  The cause of this was not clear.    ASSESSMENT/PLAN:             Hypertension.   At one time, this was a major difficulty for this patient.   This has not been a recent issue.  She is under control with propranolol ER 160 daily, Norvasc 5, and Zestril 30.  She is also on Minipress 1 tablet b.i.d.     History of peripheral vascular disease.   She is on aspirin.  She is not a diabetic.     Non-ruptured cerebral aneurysm.  She is not a candidate to have this repaired.    Non-alcoholic steatohepatitis.   Her liver function tests are normal.  I am not sure  how this was originally identified.  The confusion that she developed last month raised some concern about an underlying undiagnosed cirrhosis with encephalopathy, although her ammonia level was normal.  That does not rule out decompensated cirrhosis.  However, this stopped as soon as it began and the cause was not really identified.  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    The patient looks very stable right now.     CPT CODE: 16109

## 2014-12-26 NOTE — Progress Notes (Addendum)
Patient ID: [name], female   DOB: 1926/03/04, 81 y.o.   MRN: [id number]                PROGRESS NOTE  DATE:  12/19/2014           FACILITY: [facility]                     LEVEL OF CARE:   SNF   Routine Visit               CHIEF COMPLAINT:  Routine medical visit/[facility] visit.    HISTORY OF PRESENT ILLNESS:  This is a patient who came to the building in 2012.    Her major premorbid issues were accelerated hypertension, although that has not been a problem here.    She also has a history of a left middle cerebral artery aneurysm which has been asymptomatic.     She had a myringotomy tube placed earlier this year for right middle ear effusion.     Last month in the early part of August, she developed delusional thought including fears that she could hear voices in her room or that things were talking to her through the wall.  This was discussed with me over the phone.  She went on to have a CT scan of the head that showed some interval progression of small vessel disease throughout the periventricular white matter.  However, no acute issues were seen.     There was concern raised about her underlying non-alcoholic steatohepatitis.  She went on to have an ammonia level which was 71, certainly not elevated.      It would appear that the delusional thought has been controlled.   She apparently would not take lactulose.  Psychiatry has started her on a small dose of Xanax.     CURRENT MEDICATIONS: Medication list is reviewed.   Multivitamin.   Propranolol ER 160 mg daily.   Norvasc 5 mg daily.   ASA 81 q.d.   Zestril 30 q.d.   Prazosin 3 mg b.i.d.   Colace 100 b.i.d.   Protonix 20 b.i.d.   Neurontin 700 q.h.s.   Vitamin D3, 50,000 U monthly.    LABORATORY DATA/RADIOLOGY:   TSH was 1.120.    Her white count  was 6.2, hemoglobin 12, platelet count 233.  Differential count was normal.    Basic metabolic panel  showed a glucose of 157, BUN 17, creatinine 0.84.     Liver function tests were normal.    Albumin was 3.7.    An abdominal ultrasound was done that showed non-visualization of the pancreas; otherwise, unremarkable.    REVIEW OF SYSTEMS:    HEENT:   No headache or dizziness.   CHEST/RESPIRATORY:  No shortness of breath.   CARDIAC:  No chest pain.   EDEMA/VARICOSITIES:  Extremities:  No swelling.    GI:  Apparently, her appetite is about 50-75%.     GU:  No dysuria.    MUSCULOSKELETAL:  She is complaining of low back pain.           PSYCHIATRIC:   I do not seem to really recall the significance of her problems last month.  Denies depression.     PHYSICAL EXAMINATION:   VITAL SIGNS:     TEMPERATURE:  97.5.     PULSE:  72.      RESPIRATIONS:  18.      BLOOD PRESSURE:  124/62.  WEIGHT:  134 pounds.   This has largely been stable for the last year.   GENERAL APPEARANCE:  The patient is not in any distress.           HEENT:   EYES:  Pupils equal and reactive.   MOUTH/THROAT:  Oral exam is normal.     CHEST/RESPIRATORY:  Exam shows clear air entry bilaterally.     CARDIOVASCULAR:   CARDIAC:  Heart sounds are normal.  There are no murmurs.    GASTROINTESTINAL:   LIVER/SPLEEN/KIDNEYS:  No liver, no spleen.  No tenderness.   No asterixis.     GENITOURINARY:   BLADDER:  No suprapubic pain or fullness.  No CVA tenderness.     CIRCULATION:   EDEMA/VARICOSITIES:  Extremities:  No edema.     NEUROLOGICAL:    SENSATION/STRENGTH:  She is able to move all her limbs.  Equal strength bilaterally.   PSYCHIATRIC:   MENTAL STATUS:  I see no evidence of the delusional thought she exhibited.  Psychiatry has labeled this as a delusional disorder.  The cause of this was not clear.     ASSESSMENT/PLAN:             Hypertension.   At one time, this was a major difficulty for this patient.   This has not been a recent issue.  She is under control with propranolol ER 160 daily, Norvasc 5, and  Zestril 30.  She is also on Minipress 1 tablet b.i.d.     History of peripheral vascular disease.   She is on aspirin.  She is not a diabetic.     Non-ruptured cerebral aneurysm.  She is not a candidate to have this repaired.    Non-alcoholic steatohepatitis.   Her liver function tests are normal.  I am not sure how this was originally identified.  The confusion that she developed last month raised some concern about an underlying undiagnosed cirrhosis with encephalopathy, although her ammonia level was normal.  That does not rule out decompensated cirrhosis.  However, this stopped as soon as it began and the cause was not really identified.  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    The patient looks very stable right now.       cerebral aneurysm   EXAM: CT HEAD WITHOUT CONTRAST   TECHNIQUE: Contiguous axial images were obtained from the base of the skull through the vertex without intravenous contrast.   COMPARISON:  MRI brain of 08/17/2011 and CT brain scan of 08/15/2011   FINDINGS: The ventricular system is unchanged in size and configuration and mild cortical atrophy is again noted. The septum is midline in position. The fourth ventricle and basilar cisterns are unremarkable. No hemorrhage, mass lesion or acute infarction is seen. Small vessel ischemic change has progressed somewhat throughout the periventricular white matter. Chronic left sphenoid sinusitis is again noted. No calvarial abnormality is seen. Soft tissue prominence in the right high frontal region is stable and clinical correlation is recommended.   IMPRESSION: 1. Probable some interval progression of small vessel ischemic change throughout the periventricular white matter. 2. Atrophy.  No acute abnormality. 3. No change in chronic left sphenoid sinusitis. 4. Stable soft tissue lesion in the high right frontal region.     Electronically Signed   By: Lucienne Minks.D.

## 2015-01-09 ENCOUNTER — Other Ambulatory Visit: Payer: Self-pay | Admitting: Internal Medicine

## 2015-01-09 DIAGNOSIS — I671 Cerebral aneurysm, nonruptured: Secondary | ICD-10-CM

## 2015-01-09 DIAGNOSIS — F22 Delusional disorders: Secondary | ICD-10-CM

## 2015-01-15 ENCOUNTER — Non-Acute Institutional Stay (SKILLED_NURSING_FACILITY): Payer: Medicare Other | Admitting: Internal Medicine

## 2015-01-15 DIAGNOSIS — F22 Delusional disorders: Secondary | ICD-10-CM | POA: Diagnosis not present

## 2015-01-15 DIAGNOSIS — R410 Disorientation, unspecified: Secondary | ICD-10-CM | POA: Diagnosis not present

## 2015-01-17 NOTE — Progress Notes (Signed)
Patient ID: [name], female   DOB: 08-17-1925, 81 y.o.   MRN: [id number]                PROGRESS NOTE  DATE:  01/15/2015        FACILITY: [facility]             LEVEL OF CARE:   SNF   Acute Visit               CHIEF COMPLAINT:  Acute psychosis.     HISTORY OF PRESENT ILLNESS:  This is a patient who has been in the building since 2012.    As far as I am aware, she does not have a premorbid psychiatric history.  She had a problem in the early part of August when she became delusional, thought that she could hear voices in her room or that things were talking to her through the wall.  She went on to have a CT scan of the head that showed interval progression of small vessel disease throughout the periventricular white matter; however, no other acute issues.  We had some concerns at the time that the patient has a history of NASH and that this might represent early hepatic encephalopathy.  However, her ammonia level, for what that is worth, was only 71; and all of this appears to have gone away on its own.    More recently, in the late part of September, she started to become again increasingly psychotic, i.e. hearing a man in the room, not eating due to the fact that the man was harming her food or poisoning her food.  Today, she talks about "the Mexicans" and points to the ceiling.    She suffered a recent loss in the death of her son-in-law.  Although I have not seen her specifically for this, I have had several conversations with the [facility] nurse practitioner.  She started her on Risperdal 0.25 b.i.d. and I note ordered an MRI of the head.  The Risperdal was increased on 01/10/2015.   A urine culture was ordered, although I do not have access to the computer system today.    There was some suggestion that Minipress caused these hallucinations.   Therefore, that was stopped and we are monitoring her blood pressure.    CURRENT MEDICATIONS:  Medication list is reviewed.    Otherwise, medications include:    Propranolol ER 160 a day.    Norvasc 5 q.d.          ASA 81 q.d.      Zestril 30 q.d.      Colace 100 b.i.d.      Protonix 20 b.i.d.     Neurontin 700 q.h.s.      Vitamin D3, 50,000 U monthly.     Risperdal 0.5 b.i.d.      REVIEW OF SYSTEMS:   Very difficult in this patient.     GENERAL:  Her attention span is not very good.  I think she is very fearful.  I do not think this is an acute confusional state.      HEENT:   She is not complaining of headache or dizziness.    CHEST/RESPIRATORY:  No shortness of breath.   CARDIAC:  No chest pain.   GI:  No abdominal pain.    GU:  No dysuria.     PHYSICAL EXAMINATION:   HEENT:   EYES:  Extraocular movements are normal.    CHEST/RESPIRATORY:  Clear air  entry bilaterally.    CARDIOVASCULAR:   CARDIAC:  Heart sounds are normal.   GASTROINTESTINAL:   LIVER/SPLEEN/KIDNEYS:  No liver, no spleen.  No asterixis.    NEUROLOGICAL:   I can detect no lateralizing signs.   CRANIAL NERVES:  Normal.     MOTOR:  Normal.    DEEP TENDON REFLEXES:  Normal.   BALANCE/GAIT:  She is up walking with her walker.  Appears steady.    PSYCHIATRIC:   MENTAL STATUS:  She is orientated to person, place, time, building.  Her speech is difficult to comprehend, almost nasally.   She is fearful, points to the ceiling and is concerned about somebody coming through or in.   She talks about "Mexicans".  Apparently, the nurses are reporting that still she is concerned about her food, which is really a difficult thing.  There is also some depression here, and I wonder if this may be more depression-mediated.    ASSESSMENT/PLAN:              Acute psychosis.   I have never heard of prazosin having this type of issue.   Neurontin, however, certainly worries me in this setting.  I do not disagree with the MRI of her head.  Nevertheless, her Risperdal certainly needs to be increased.  Consider adding an antidepressant when the patient  is more stable from this point of view.  I suspect this is a delirium although the history I am hearing from her daughter that there may some psychiatric history.

## 2015-01-25 ENCOUNTER — Non-Acute Institutional Stay (SKILLED_NURSING_FACILITY): Payer: Medicare Other | Admitting: Internal Medicine

## 2015-01-25 ENCOUNTER — Other Ambulatory Visit: Payer: Self-pay | Admitting: *Deleted

## 2015-01-25 DIAGNOSIS — R410 Disorientation, unspecified: Secondary | ICD-10-CM

## 2015-01-25 DIAGNOSIS — F22 Delusional disorders: Secondary | ICD-10-CM | POA: Diagnosis not present

## 2015-01-25 MED ORDER — ALPRAZOLAM 0.25 MG PO TABS: 0.2500 mg | ORAL_TABLET | Freq: Two times a day (BID) | ORAL | Status: DC | PRN

## 2015-01-27 ENCOUNTER — Ambulatory Visit (HOSPITAL_COMMUNITY)
Admission: RE | Admit: 2015-01-27 | Discharge: 2015-01-27 | Disposition: A | Discharge status: Home / Self Care | Payer: Medicare Other | Source: Ambulatory Visit | Attending: Internal Medicine | Admitting: Internal Medicine

## 2015-01-27 DIAGNOSIS — F23 Brief psychotic disorder: Secondary | ICD-10-CM | POA: Insufficient documentation

## 2015-01-27 DIAGNOSIS — R938 Abnormal findings on diagnostic imaging of other specified body structures: Secondary | ICD-10-CM | POA: Insufficient documentation

## 2015-01-27 DIAGNOSIS — I671 Cerebral aneurysm, nonruptured: Secondary | ICD-10-CM | POA: Insufficient documentation

## 2015-01-27 DIAGNOSIS — F22 Delusional disorders: Secondary | ICD-10-CM

## 2015-01-30 NOTE — Progress Notes (Signed)
Patient ID: Valerie BurnsMildred V Brewer, female   DOB: [birth year], 81 y.o.   MRN: [id number]                PROGRESS NOTE  DATE:  01/25/2015           [facility]                     LEVEL OF CARE:   SNF   Acute Visit/Routine Visit                      CHIEF COMPLAINT:  Follow up acute psychotic issues, routine medical visit/[facility] visit/review of [facility] records.      HISTORY OF PRESENT ILLNESS:  This is a patient whom I saw 10 days ago.  She has been in the building since 2012 and did not have an overt premorbid psychiatric history.    She had a problem early in August when she became delusional, thought she could hear voices in her room and that people were talking to her through the wall.  She went to have a CT scan of the head that showed interval progression of small vessel disease throughout the periventricular white matter; however, no other issues.  We had some concerns at the time that the patient had a history of NASH and this might represent undiagnosed hepatic cirrhosis with encephalopathy.  However, her ammonia level, for what that was worth, was only 71 and all of this appeared to go away on its own.    Later on in the late part of September, she again became increasingly psychotic with flagrant auditory hallucinations, paranoid delusions including thinking her food was poisoned.  She had recently suffered the death of her son-in-law.  We initiated treatment with escalating doses of Risperdal.  I also reduced her Neurontin to 400 p.o. q.h.s.   Past Medical History  Diagnosis Date  . Chronic back pain   . Obstructive chronic bronchitis   . Muscle weakness   . Cirrhosis   . Cerebral aneurysm 07/2011    "non ruptured; left side; behind optical nerve"  . H/O hiatal hernia   . Complication of anesthesia     "don't like medicine or to be put to sleep"  . HOH (hard of hearing), right   . Deaf, left   . Hypertension   . Hypertensive encephalopathy   . Pacemaker     'as a  child"  . DVT (deep venous thrombosis), right 1970's    "not long after knee OR"  . Emphysema   . Sleep apnea     "doesn't wear mask"  . Exertional dyspnea   . Shortness of breath 08/15/11    "& when lying down"  . GERD (gastroesophageal reflux disease)   . Hepatitis   . Steatohepatitis, non-alcoholic   . Headache(784.0)   . Migraines   . Scoliosis   . Osteoarthritis of lumbar spine   . History of shingles 05/2010    CURRENT MEDICATIONS:  Medication list is reviewed.            Propranolol ER 160 q.d.     Norvasc 5 q.d.     ASA 81 q.d.     Zestril 30 q.d.      Colace 100 b.i.d.     Protonix 20 b.i.d.     Neurontin 400 q.h.s.     Vitamin D3, 50,000 U monthly.     Risperdal 1 mg b.i.d.  REVIEW OF SYSTEMS:    GENERAL:  She is much better.  Greeted me warmly and in a friendly fashion.  She seemed more attentive.  She did not appear to be tearful.   HEENT:   No headache.  No dizziness.    CHEST/RESPIRATORY:  No shortness of breath.   CARDIAC:  No chest pain.   GI:  No abdominal pain.    GU:  No dysuria.    MUSCULOSKELETAL:  No joint pain.  She does complain about more back pain, however, which is chronic.    NEUROLOGICAL:  States she is able to get up with her walker and has not had anymore difficulties.   Mental Status: She seems better,no spontaneous paranoid complaints  PHYSICAL EXAMINATION:   VITAL SIGNS:     TEMPERATURE:  97.6.   PULSE:  76.    RESPIRATIONS:  18.      BLOOD PRESSURE:  124/76.    02 SATURATIONS:  96%.     CHEST/RESPIRATORY:  Clear air entry bilaterally.    CARDIOVASCULAR:   CARDIAC:  Heart sounds are normal.   GASTROINTESTINAL:   ABDOMEN:  Soft, nontender.   LIVER/SPLEEN/KIDNEYS:  There is no stigmata of chronic liver disease.     GENITOURINARY:   BLADDER:  Not distended.   There is no CVA tenderness.    NEUROLOGICAL:   Her motor strength is normal.  She does not have rigidity or bradykinesia.   PSYCHIATRIC:   MENTAL STATUS:  She is  orientated.  Her speech is still nasally.  I am not sure I clearly understand that.  Her delusional thought is not present in spite of my attempts to bring it out.  She seems a lot better.  Any element of depression here seems also better.    ASSESSMENT/PLAN:            Acute psychosis.   I thought this was probably delirium.  An MRI of her head was ordered, although I do not see this result.  If she has a background psychotic illness, it would be nice to know this.  For now, I think she should continue the Risperdal for three months before we consider any form of very gradual taper.  Her delusions were very distressing, including thinking her food was poisoned.

## 2015-03-01 ENCOUNTER — Other Ambulatory Visit: Payer: Self-pay

## 2015-03-01 MED ORDER — ALPRAZOLAM 0.25 MG PO TABS: 0.2500 mg | ORAL_TABLET | Freq: Two times a day (BID) | ORAL | Status: DC | PRN

## 2015-03-01 MED ORDER — ALPRAZOLAM 0.25 MG PO TABS: 0.2500 mg | ORAL_TABLET | Freq: Two times a day (BID) | ORAL | Status: AC | PRN

## 2015-05-28 ENCOUNTER — Emergency Department (HOSPITAL_COMMUNITY): Payer: Medicare Other

## 2015-05-28 ENCOUNTER — Observation Stay (HOSPITAL_COMMUNITY): Payer: Medicare Other

## 2015-05-28 ENCOUNTER — Encounter (HOSPITAL_COMMUNITY): Payer: Self-pay | Admitting: *Deleted

## 2015-05-28 ENCOUNTER — Observation Stay (HOSPITAL_COMMUNITY)
Admission: EM | Admit: 2015-05-28 | Discharge: 2015-05-31 | Disposition: A | Discharge status: Skilled Nursing Facility | Payer: Medicare Other | Attending: Internal Medicine | Admitting: Internal Medicine

## 2015-05-28 DIAGNOSIS — H9190 Unspecified hearing loss, unspecified ear: Secondary | ICD-10-CM | POA: Diagnosis not present

## 2015-05-28 DIAGNOSIS — R339 Retention of urine, unspecified: Secondary | ICD-10-CM | POA: Diagnosis not present

## 2015-05-28 DIAGNOSIS — R627 Adult failure to thrive: Secondary | ICD-10-CM | POA: Diagnosis not present

## 2015-05-28 DIAGNOSIS — G8194 Hemiplegia, unspecified affecting left nondominant side: Secondary | ICD-10-CM

## 2015-05-28 DIAGNOSIS — Z79899 Other long term (current) drug therapy: Secondary | ICD-10-CM | POA: Diagnosis not present

## 2015-05-28 DIAGNOSIS — I10 Essential (primary) hypertension: Secondary | ICD-10-CM | POA: Diagnosis present

## 2015-05-28 DIAGNOSIS — Z9119 Patient's noncompliance with other medical treatment and regimen: Secondary | ICD-10-CM | POA: Insufficient documentation

## 2015-05-28 DIAGNOSIS — G459 Transient cerebral ischemic attack, unspecified: Secondary | ICD-10-CM

## 2015-05-28 DIAGNOSIS — K7581 Nonalcoholic steatohepatitis (NASH): Secondary | ICD-10-CM | POA: Diagnosis present

## 2015-05-28 DIAGNOSIS — R200 Anesthesia of skin: Secondary | ICD-10-CM | POA: Insufficient documentation

## 2015-05-28 DIAGNOSIS — G4733 Obstructive sleep apnea (adult) (pediatric): Secondary | ICD-10-CM | POA: Diagnosis not present

## 2015-05-28 DIAGNOSIS — Z86718 Personal history of other venous thrombosis and embolism: Secondary | ICD-10-CM | POA: Diagnosis not present

## 2015-05-28 DIAGNOSIS — K7460 Unspecified cirrhosis of liver: Secondary | ICD-10-CM | POA: Insufficient documentation

## 2015-05-28 DIAGNOSIS — F419 Anxiety disorder, unspecified: Secondary | ICD-10-CM | POA: Insufficient documentation

## 2015-05-28 DIAGNOSIS — Z66 Do not resuscitate: Secondary | ICD-10-CM | POA: Diagnosis not present

## 2015-05-28 DIAGNOSIS — Q359 Cleft palate, unspecified: Secondary | ICD-10-CM | POA: Diagnosis not present

## 2015-05-28 DIAGNOSIS — I729 Aneurysm of unspecified site: Secondary | ICD-10-CM

## 2015-05-28 DIAGNOSIS — J439 Emphysema, unspecified: Secondary | ICD-10-CM | POA: Insufficient documentation

## 2015-05-28 DIAGNOSIS — Z7902 Long term (current) use of antithrombotics/antiplatelets: Secondary | ICD-10-CM | POA: Insufficient documentation

## 2015-05-28 DIAGNOSIS — R531 Weakness: Principal | ICD-10-CM | POA: Insufficient documentation

## 2015-05-28 DIAGNOSIS — Z7982 Long term (current) use of aspirin: Secondary | ICD-10-CM | POA: Diagnosis not present

## 2015-05-28 DIAGNOSIS — R338 Other retention of urine: Secondary | ICD-10-CM | POA: Diagnosis not present

## 2015-05-28 DIAGNOSIS — I671 Cerebral aneurysm, nonruptured: Secondary | ICD-10-CM | POA: Diagnosis not present

## 2015-05-28 DIAGNOSIS — R4182 Altered mental status, unspecified: Secondary | ICD-10-CM | POA: Diagnosis not present

## 2015-05-28 DIAGNOSIS — K219 Gastro-esophageal reflux disease without esophagitis: Secondary | ICD-10-CM | POA: Diagnosis not present

## 2015-05-28 DIAGNOSIS — R208 Other disturbances of skin sensation: Secondary | ICD-10-CM | POA: Diagnosis present

## 2015-05-28 DIAGNOSIS — L89301 Pressure ulcer of unspecified buttock, stage 1: Secondary | ICD-10-CM | POA: Insufficient documentation

## 2015-05-28 DIAGNOSIS — R29898 Other symptoms and signs involving the musculoskeletal system: Secondary | ICD-10-CM

## 2015-05-28 DIAGNOSIS — R4781 Slurred speech: Secondary | ICD-10-CM

## 2015-05-28 DIAGNOSIS — L8991 Pressure ulcer of unspecified site, stage 1: Secondary | ICD-10-CM | POA: Diagnosis present

## 2015-05-28 DIAGNOSIS — J449 Chronic obstructive pulmonary disease, unspecified: Secondary | ICD-10-CM | POA: Diagnosis present

## 2015-05-28 LAB — CBC WITH DIFFERENTIAL/PLATELET
BASOS ABS: 0 10*3/uL (ref 0.0–0.1)
Basophils Relative: 0 %
Eosinophils Absolute: 0.1 10*3/uL (ref 0.0–0.7)
Eosinophils Relative: 1 %
HEMATOCRIT: 39.4 % (ref 36.0–46.0)
Hemoglobin: 13 g/dL (ref 12.0–15.0)
LYMPHS PCT: 21 %
Lymphs Abs: 1.7 10*3/uL (ref 0.7–4.0)
MCH: 30.2 pg (ref 26.0–34.0)
MCHC: 33 g/dL (ref 30.0–36.0)
MCV: 91.6 fL (ref 78.0–100.0)
MONOS PCT: 8 %
Monocytes Absolute: 0.6 10*3/uL (ref 0.1–1.0)
NEUTROS ABS: 5.7 10*3/uL (ref 1.7–7.7)
Neutrophils Relative %: 70 %
PLATELETS: 190 10*3/uL (ref 150–400)
RBC: 4.3 MIL/uL (ref 3.87–5.11)
RDW: 13.7 % (ref 11.5–15.5)
WBC: 8.1 10*3/uL (ref 4.0–10.5)

## 2015-05-28 LAB — RAPID URINE DRUG SCREEN, HOSP PERFORMED
Amphetamines: NOT DETECTED
BENZODIAZEPINES: POSITIVE — AB
Barbiturates: NOT DETECTED
Cocaine: NOT DETECTED
Opiates: NOT DETECTED
Tetrahydrocannabinol: NOT DETECTED

## 2015-05-28 LAB — URINALYSIS, ROUTINE W REFLEX MICROSCOPIC
Bilirubin Urine: NEGATIVE
GLUCOSE, UA: NEGATIVE mg/dL
HGB URINE DIPSTICK: NEGATIVE
Ketones, ur: NEGATIVE mg/dL
Leukocytes, UA: NEGATIVE
NITRITE: NEGATIVE
PH: 7 (ref 5.0–8.0)
Protein, ur: NEGATIVE mg/dL
Specific Gravity, Urine: 1.009 (ref 1.005–1.030)

## 2015-05-28 LAB — COMPREHENSIVE METABOLIC PANEL
ALK PHOS: 61 U/L (ref 38–126)
ALT: 12 U/L — ABNORMAL LOW (ref 14–54)
ANION GAP: 13 (ref 5–15)
AST: 19 U/L (ref 15–41)
Albumin: 3.5 g/dL (ref 3.5–5.0)
BILIRUBIN TOTAL: 0.9 mg/dL (ref 0.3–1.2)
BUN: 15 mg/dL (ref 6–20)
CALCIUM: 8.6 mg/dL — AB (ref 8.9–10.3)
CO2: 25 mmol/L (ref 22–32)
Chloride: 96 mmol/L — ABNORMAL LOW (ref 101–111)
Creatinine, Ser: 0.59 mg/dL (ref 0.44–1.00)
GFR calc Af Amer: >60 mL/min (ref >60)
GFR calc non Af Amer: >60 mL/min (ref >60)
Glucose, Bld: 114 mg/dL — ABNORMAL HIGH (ref 65–99)
POTASSIUM: 3.7 mmol/L (ref 3.5–5.1)
SODIUM: 134 mmol/L — AB (ref 135–145)
TOTAL PROTEIN: 6 g/dL — AB (ref 6.5–8.1)

## 2015-05-28 LAB — AMMONIA: Ammonia: 24 umol/L (ref 9–35)

## 2015-05-28 LAB — PROTIME-INR
INR: 0.97 (ref 0.00–1.49)
Prothrombin Time: 13.1 seconds (ref 11.6–15.2)

## 2015-05-28 LAB — TSH: TSH: 0.708 u[IU]/mL (ref 0.350–4.500)

## 2015-05-28 LAB — APTT: aPTT: 29 seconds (ref 24–37)

## 2015-05-28 MED ORDER — LORATADINE 10 MG PO TABS: 10.0000 mg | ORAL_TABLET | Freq: Every day | ORAL | Status: DC | PRN

## 2015-05-28 MED ORDER — IPRATROPIUM-ALBUTEROL 0.5-2.5 (3) MG/3ML IN SOLN: 3.0000 mL | Freq: Four times a day (QID) | RESPIRATORY_TRACT | Status: DC | PRN

## 2015-05-28 MED ORDER — PROPRANOLOL HCL ER 160 MG PO CP24
160.0000 mg | ORAL_CAPSULE | Freq: Every day | ORAL | Status: DC
  Administered 2015-05-29 – 2015-05-30 (×2): 160 mg via ORAL
  Filled 2015-05-28 (×3): qty 1

## 2015-05-28 MED ORDER — ENOXAPARIN SODIUM 40 MG/0.4ML ~~LOC~~ SOLN
40.0000 mg | SUBCUTANEOUS | Status: DC
  Administered 2015-05-28 – 2015-05-30 (×3): 40 mg via SUBCUTANEOUS
  Filled 2015-05-28 (×3): qty 0.4

## 2015-05-28 MED ORDER — DOCUSATE SODIUM 100 MG PO CAPS
100.0000 mg | ORAL_CAPSULE | Freq: Two times a day (BID) | ORAL | Status: DC
  Administered 2015-05-29 – 2015-05-31 (×5): 100 mg via ORAL
  Filled 2015-05-28 (×5): qty 1

## 2015-05-28 MED ORDER — LISINOPRIL 20 MG PO TABS
30.0000 mg | ORAL_TABLET | Freq: Every day | ORAL | Status: DC
  Administered 2015-05-29 – 2015-05-30 (×2): 30 mg via ORAL
  Filled 2015-05-28 (×2): qty 1

## 2015-05-28 MED ORDER — PANTOPRAZOLE SODIUM 20 MG PO TBEC
20.0000 mg | DELAYED_RELEASE_TABLET | Freq: Every day | ORAL | Status: DC
  Administered 2015-05-29 – 2015-05-31 (×3): 20 mg via ORAL
  Filled 2015-05-28 (×3): qty 1

## 2015-05-28 MED ORDER — GABAPENTIN 300 MG PO CAPS
300.0000 mg | ORAL_CAPSULE | Freq: Every day | ORAL | Status: DC
  Administered 2015-05-29 – 2015-05-30 (×2): 300 mg via ORAL
  Filled 2015-05-28 (×2): qty 1

## 2015-05-28 MED ORDER — RISPERIDONE 0.5 MG PO TABS
0.5000 mg | ORAL_TABLET | Freq: Every day | ORAL | Status: DC
  Administered 2015-05-29 – 2015-05-31 (×3): 0.5 mg via ORAL
  Filled 2015-05-28 (×3): qty 1

## 2015-05-28 MED ORDER — ADULT MULTIVITAMIN W/MINERALS CH
1.0000 | ORAL_TABLET | Freq: Every day | ORAL | Status: DC
  Administered 2015-05-29 – 2015-05-31 (×3): 1 via ORAL
  Filled 2015-05-28 (×3): qty 1

## 2015-05-28 MED ORDER — ASPIRIN 81 MG PO CHEW
81.0000 mg | CHEWABLE_TABLET | Freq: Every day | ORAL | Status: DC
  Administered 2015-05-29 – 2015-05-31 (×3): 81 mg via ORAL
  Filled 2015-05-28 (×3): qty 1

## 2015-05-28 MED ORDER — CALCIUM CARBONATE-VITAMIN D 500-200 MG-UNIT PO TABS
1.0000 | ORAL_TABLET | Freq: Two times a day (BID) | ORAL | Status: DC
  Administered 2015-05-29 – 2015-05-31 (×5): 1 via ORAL
  Filled 2015-05-28 (×5): qty 1

## 2015-05-28 MED ORDER — SODIUM CHLORIDE 0.9 % IV BOLUS (SEPSIS)
1000.0000 mL | Freq: Once | INTRAVENOUS | Status: AC
  Administered 2015-05-28: 1000 mL via INTRAVENOUS

## 2015-05-28 MED ORDER — STROKE: EARLY STAGES OF RECOVERY BOOK
Freq: Once | Status: AC
  Administered 2015-05-29: 09:00:00
  Filled 2015-05-28: qty 1

## 2015-05-28 MED ORDER — CLOPIDOGREL BISULFATE 75 MG PO TABS
75.0000 mg | ORAL_TABLET | Freq: Every day | ORAL | Status: DC
  Administered 2015-05-29 – 2015-05-31 (×3): 75 mg via ORAL
  Filled 2015-05-28 (×3): qty 1

## 2015-05-28 MED ORDER — AMLODIPINE BESYLATE 10 MG PO TABS
10.0000 mg | ORAL_TABLET | Freq: Every day | ORAL | Status: DC
  Administered 2015-05-29 – 2015-05-30 (×2): 10 mg via ORAL
  Filled 2015-05-28 (×2): qty 1

## 2015-05-28 MED ORDER — SODIUM CHLORIDE 0.9 % IV SOLN
INTRAVENOUS | Status: DC
  Administered 2015-05-28: 20:00:00 via INTRAVENOUS

## 2015-05-28 MED ORDER — CALCIUM CARBONATE ANTACID 500 MG PO CHEW: 1.0000 | CHEWABLE_TABLET | ORAL | Status: DC | PRN

## 2015-05-28 NOTE — ED Notes (Signed)
Patient transported to X-ray 

## 2015-05-28 NOTE — Progress Notes (Signed)
Family aware that patient is currently NPO until she receives and passes swallow evaluation.  Nightshift RN also aware.

## 2015-05-28 NOTE — ED Provider Notes (Signed)
CSN: [id number]     Arrival date & time 05/28/15  1407 History   First MD Initiated Contact with Patient 05/28/15 1420     Chief Complaint  Patient presents with  . Code Stroke     (Consider location/radiation/quality/duration/timing/severity/associated sxs/prior Treatment) HPI Comments: 82 year old female with extensive past medical history including cirrhosis, cerebral aneurysm, deafness, hypertension, OSA who presents with altered mental status. History obtained primarily from EMS. They report that patient was last seen normal around 12:00 today. At 1330, staff at her nursing facility noted altered speech and patient was complaining of left-sided weakness. Her left-sided weakness has resolved but she continues to endorse left arm numbness. Patient is deaf but they state that the speech is off from her baseline.  LEVEL 5 CAVEAT 2/2 AMS  The history is provided by the EMS personnel.    Past Medical History  Diagnosis Date  . Chronic back pain   . Obstructive chronic bronchitis   . Muscle weakness   . Cirrhosis (HCC)   . Cerebral aneurysm 07/2011    "non ruptured; left side; behind optical nerve"  . H/O hiatal hernia   . Complication of anesthesia     "don't like medicine or to be put to sleep"  . HOH (hard of hearing), right   . Deaf, left   . Hypertension   . Hypertensive encephalopathy   . Pacemaker     'as a child"  . DVT (deep venous thrombosis), right 1970's    "not long after knee OR"  . Emphysema   . Sleep apnea     "doesn't wear mask"  . Exertional dyspnea   . Shortness of breath 08/15/11    "& when lying down"  . GERD (gastroesophageal reflux disease)   . Hepatitis   . Steatohepatitis, non-alcoholic   . Headache(784.0)   . Migraines   . Scoliosis   . Osteoarthritis of lumbar spine   . History of shingles 05/2010   Past Surgical History  Procedure Laterality Date  . Vaginal hysterectomy  1980's  . Knee arthroscopy  ?1970's    right  . Tympanoplasty  1950's     removed; left  . Myringotomy  08/2011    right  . Dilation and curettage of uterus  1970's   History reviewed. No pertinent family history. Social History  Substance Use Topics  . Smoking status: Never Smoker   . Smokeless tobacco: Never Used  . Alcohol Use: No   OB History    No data available     Review of Systems  Unable to perform ROS: Mental status change      Allergies  Acetaminophen; Penicillins; and Vicodin  Home Medications   Prior to Admission medications   Medication Sig Start Date End Date Taking? Authorizing Provider  amLODipine (NORVASC) 5 MG tablet Take 10 mg by mouth daily.    Yes Historical Provider, MD  clopidogrel (PLAVIX) 75 MG tablet Take 75 mg by mouth daily.   Yes Historical Provider, MD  lisinopril (PRINIVIL,ZESTRIL) 30 MG tablet Take 30 mg by mouth daily.   Yes Historical Provider, MD  pantoprazole (PROTONIX) 40 MG tablet Take 20 mg by mouth daily.    Yes Historical Provider, MD  propranolol (INDERAL LA) 160 MG SR capsule Take 160 mg by mouth daily.   Yes Historical Provider, MD  risperiDONE (RISPERDAL) 0.5 MG tablet Take 0.5 mg by mouth daily.   Yes Historical Provider, MD  ALPRAZolam (XANAX) 0.25 MG tablet Take 1 tablet (0.25 mg  total) by mouth 2 (two) times daily as needed for anxiety. 03/01/15   Kirt Boys, DO  aspirin 81 MG chewable tablet Chew 81 mg by mouth daily.    Historical Provider, MD  calcium carbonate (TUMS - DOSED IN MG ELEMENTAL CALCIUM) 500 MG chewable tablet Chew 1 tablet by mouth every 4 (four) hours as needed for indigestion or heartburn.    Historical Provider, MD  calcium-vitamin D (OSCAL WITH D) 500-200 MG-UNIT per tablet Take 1 tablet by mouth 2 (two) times daily.    Historical Provider, MD  cloNIDine (CATAPRES) 0.1 MG tablet Take 0.1 mg by mouth every 6 (six) hours as needed. For systolic BP greater than or equal to 170    Historical Provider, MD  docusate sodium (COLACE) 100 MG capsule Take 100 mg by mouth 2 (two) times  daily.    Historical Provider, MD  gabapentin (NEURONTIN) 300 MG capsule Take 300 mg by mouth at bedtime. Takes along with  to =700mg     Historical Provider, MD  gabapentin (NEURONTIN) 400 MG capsule Take 400 mg by mouth at bedtime. Take with 300 mg cap to equal 700 mg    Historical Provider, MD  ipratropium-albuterol (DUONEB) 0.5-2.5 (3) MG/3ML SOLN Take 3 mLs by nebulization every 6 (six) hours as needed (for wheezing).    Historical Provider, MD  loratadine (CLARITIN) 10 MG tablet Take 10 mg by mouth daily as needed for allergies.    Historical Provider, MD  methylPREDNISolone (MEDROL DOSEPAK) 4 MG tablet Take 4 mg TID for one day then take 4 mg QID for 2 days then take 8 mg daily for two days then stop. 08/06/13   Belkys A Regalado, MD  Multiple Vitamin (MULITIVITAMIN WITH MINERALS) TABS Take 1 tablet by mouth daily.    Historical Provider, MD  prazosin (MINIPRESS) 1 MG capsule Take 1 mg by mouth 2 (two) times daily. Take 2 mg capsule along with 1 mg capsule for total of 3 mg twice daily    Historical Provider, MD  prazosin (MINIPRESS) 2 MG capsule Take 2 mg by mouth 2 (two) times daily. Take 2 mg capsule along with 1 mg capsule for total of 3 mg twice daily    Historical Provider, MD   BP 179/89 mmHg  Pulse 116  Temp(Src) 98.2 F (36.8 C) (Oral)  Resp 18  Wt 125 lb 14.1 oz (57.1 kg)  SpO2 98% Physical Exam  Constitutional: She appears well-developed and well-nourished. No distress.  HENT:  Head: Normocephalic and atraumatic.  dry mucous membranes; partial cleft palate, dentures in place; R ear canal with blue medical device implanted; L TM abnormal in appearance, no erythema  Eyes: Conjunctivae are normal. Pupils are equal, round, and reactive to light.  Neck: Neck supple.  Cardiovascular: Normal rate, regular rhythm and normal heart sounds.   No murmur heard. Pulmonary/Chest: Effort normal and breath sounds normal.  Abdominal: Soft. Bowel sounds are normal. She exhibits no  distension. There is no tenderness.  Musculoskeletal: She exhibits no edema.  5/5 strength x all 4 ext  Neurological: She is alert.  Slurred speech but normal comprehension, able to follow commands; deaf  Skin: Skin is warm and dry.  Psychiatric:  calm  Nursing note and vitals reviewed.   ED Course  Procedures (including critical care time) Labs Review Labs Reviewed  COMPREHENSIVE METABOLIC PANEL - Abnormal; Notable for the following:    Sodium 134 (*)    Chloride 96 (*)    Glucose, Bld 114 (*)  Calcium 8.6 (*)    Total Protein 6.0 (*)    ALT 12 (*)    All other components within normal limits  AMMONIA  CBC WITH DIFFERENTIAL/PLATELET  PROTIME-INR  APTT  URINE RAPID DRUG SCREEN, HOSP PERFORMED    Imaging Review Ct Head Code Stroke W/o Cm  05/28/2015  CLINICAL DATA:  Left-sided weakness.  Code stroke. EXAM: CT HEAD WITHOUT CONTRAST TECHNIQUE: Contiguous axial images were obtained from the base of the skull through the vertex without intravenous contrast. COMPARISON:  01/27/2015 FINDINGS: Global atrophy. Chronic ischemic changes. There is no mass effect, midline shift, or acute intracranial hemorrhage. Dense atherosclerotic calcifications of the carotid siphons. There is dense mucous material in the left sphenoid sinus. Left mastoidectomy has been performed. Right mastoid air cells are clear. IMPRESSION: No acute intracranial pathology. Electronically Signed   By: Jolaine Click M.D.   On: 05/28/2015 14:30   I have personally reviewed and evaluated these lab results as part of my medical decision-making.   EKG Interpretation   Date/Time:  Sunday May 28 2015 14:35:15 EST Ventricular Rate:  117 PR Interval:  161 QRS Duration: 83 QT Interval:  351 QTC Calculation: 490 R Axis:   40 Text Interpretation:  Sinus tachycardia ST elevation, consider inferior  injury Borderline prolonged QT interval Artifact in lead(s) I Since last  tracing rate faster Otherwise no significant  change Confirmed by Ellarie Picking  MD, Meredith Kilbride ([id number]) on 05/28/2015 2:42:50 PM      MDM   Final diagnoses:  Altered mental status, unspecified altered mental status type  Left arm numbness  Slurred speech   Patient with multiple medical problems including cirrhosis presents with altered speech and left-sided weakness that has resolved but persistent left-sided numbness noted at 1:30 PM today. A stroke alert was called by EMS in route and on arrival the patient was awake, alert, and comfortable. Initial vital signs stable. She was mildly tachycardic, no ischemic changes on EKG. On exam, her speech was altered but I did note a partial cleft palate and medical device in her right ear and it is possible that her speech is altered because of her deafness. Patient following commands with normal strength in all 4 extremities. Head CT negative for acute process. Neurology, Dr. Lavon Paganini, evaluated pt in ED and canceled code stroke. Patient does have a history of hepatic encephalopathy thus differential diagnosis is broad and includes hyperammonemia, infection, or drug interaction given pt had received xanax earlier today. Dr. Lavon Paganini recommended medicine admission for w/u of AMS.   Discussed admission with [facility] hospitalist NP, Jill Side, and pt admitted for observation.   Laurence Spates, MD 05/28/15 [phone]

## 2015-05-28 NOTE — Progress Notes (Signed)
Patient is currently NPO upon admission to [facility] [place]. All scheduled night medications are oral medications. Hospitalists made aware. Nursing will continue to monitor.

## 2015-05-28 NOTE — ED Notes (Signed)
CANCELED CODE STROKE  

## 2015-05-28 NOTE — Consult Note (Signed)
Requesting Physician: Dr.  Clarene Duke    Reason for consultation: stroke code  HPI:                                                                                                                                         [name] is an 82 y.o. female patient who presented with possible mental status changes and concern for left sided weakness and numbness symptoms. Patient has severe deafness, and a poor historian.   Date last known well:  05/28/15 Time last known well:  12 pm tPA Given: No: non focal exam, unlikely to be acute stroke based on clinical evaluation.   Stroke Risk Factors - hypertension  Past Medical History: Past Medical History  Diagnosis Date  . Chronic back pain   . Obstructive chronic bronchitis   . Muscle weakness   . Cirrhosis   . Cerebral aneurysm 07/2011    "non ruptured; left side; behind optical nerve"  . H/O hiatal hernia   . Complication of anesthesia     "don't like medicine or to be put to sleep"  . HOH (hard of hearing), right   . Deaf, left   . Hypertension   . Hypertensive encephalopathy   . Pacemaker     'as a child"  . DVT (deep venous thrombosis), right 1970's    "not long after knee OR"  . Emphysema   . Sleep apnea     "doesn't wear mask"  . Exertional dyspnea   . Shortness of breath 08/15/11    "& when lying down"  . GERD (gastroesophageal reflux disease)   . Hepatitis   . Steatohepatitis, non-alcoholic   . Headache(784.0)   . Migraines   . Scoliosis   . Osteoarthritis of lumbar spine   . History of shingles 05/2010    Past Surgical History  Procedure Laterality Date  . Vaginal hysterectomy  1980's  . Knee arthroscopy  ?1970's    right  . Tympanoplasty  1950's    removed; left  . Myringotomy  08/2011    right  . Dilation and curettage of uterus  1970's    Family History: No family history on file.  Social History:   reports that she has never smoked. She has never used smokeless tobacco. She reports that she does not  drink alcohol or use illicit drugs.  Allergies:  Allergies  Allergen Reactions  . Acetaminophen Nausea And Vomiting  . Penicillins Anaphylaxis  . Vicodin [Hydrocodone-Acetaminophen] Nausea And Vomiting     Medications:  Current facility-administered medications:  .  sodium chloride 0.9 % bolus 1,000 mL, 1,000 mL, Intravenous, Once, Laurence Spates, MD  Current outpatient prescriptions:  .  ALPRAZolam (XANAX) 0.25 MG tablet, Take 1 tablet (0.25 mg total) by mouth 2 (two) times daily as needed for anxiety., Disp: 90 tablet, Rfl: 0 .  amLODipine (NORVASC) 5 MG tablet, Take 5 mg by mouth daily., Disp: , Rfl:  .  aspirin 81 MG chewable tablet, Chew 81 mg by mouth daily., Disp: , Rfl:  .  calcium carbonate (TUMS - DOSED IN MG ELEMENTAL CALCIUM) 500 MG chewable tablet, Chew 1 tablet by mouth every 4 (four) hours as needed for indigestion or heartburn., Disp: , Rfl:  .  calcium-vitamin D (OSCAL WITH D) 500-200 MG-UNIT per tablet, Take 1 tablet by mouth 2 (two) times daily., Disp: , Rfl:  .  cloNIDine (CATAPRES) 0.1 MG tablet, Take 0.1 mg by mouth every 6 (six) hours as needed. For systolic BP greater than or equal to 170, Disp: , Rfl:  .  docusate sodium (COLACE) 100 MG capsule, Take 100 mg by mouth 2 (two) times daily., Disp: , Rfl:  .  gabapentin (NEURONTIN) 300 MG capsule, Take 300 mg by mouth at bedtime. Takes along with 400mg  to =700mg , Disp: , Rfl:  .  gabapentin (NEURONTIN) 400 MG capsule, Take 400 mg by mouth at bedtime. Take with 300 mg cap to equal 700 mg, Disp: , Rfl:  .  ipratropium-albuterol (DUONEB) 0.5-2.5 (3) MG/3ML SOLN, Take 3 mLs by nebulization every 6 (six) hours as needed (for wheezing)., Disp: , Rfl:  .  lisinopril (PRINIVIL,ZESTRIL) 30 MG tablet, Take 30 mg by mouth daily., Disp: , Rfl:  .  loratadine (CLARITIN) 10 MG tablet, Take 10 mg by mouth daily  as needed for allergies., Disp: , Rfl:  .  methylPREDNISolone (MEDROL DOSEPAK) 4 MG tablet, Take 4 mg TID for one day then take 4 mg QID for 2 days then take 8 mg daily for two days then stop., Disp: 16 tablet, Rfl: 0 .  Multiple Vitamin (MULITIVITAMIN WITH MINERALS) TABS, Take 1 tablet by mouth daily., Disp: , Rfl:  .  pantoprazole (PROTONIX) 40 MG tablet, Take 40 mg by mouth daily., Disp: , Rfl:  .  prazosin (MINIPRESS) 1 MG capsule, Take 1 mg by mouth 2 (two) times daily. Take 2 mg capsule along with 1 mg capsule for total of 3 mg twice daily, Disp: , Rfl:  .  prazosin (MINIPRESS) 2 MG capsule, Take 2 mg by mouth 2 (two) times daily. Take 2 mg capsule along with 1 mg capsule for total of 3 mg twice daily, Disp: , Rfl:  .  propranolol (INDERAL LA) 160 MG SR capsule, Take 160 mg by mouth daily., Disp: , Rfl:    ROS:  History    unobtainable from patient due to lack of cooperation    Neurologic Examination:                                                                                                      Weight 57.1 kg (125 lb 14.1 oz).  Evaluation of higher integrative functions including: Level of alertness: Alert,  Oriented to time, place and person Speech: slightly altered speech at baseline due to cleft palate with subtle palatal dysarthria, which is baseline for her. Otherwise, no definite evidence of new dysarthria or any aphasia noted.  Test the following cranial nerves: 2-12 grossly intact Motor examination: Normal tone, bulk, full 5/5 motor strength in all 4 extremities, no motor weakness on left.  Examination of sensation :  symmetric sensation to pinprick in all 4 extremities and on face, incl on left side Examination of deep tendon reflexes: 1+,  symmetric in all extremities, normal plantars bilaterally Test coordination: Normal finger nose  testing, with no evidence of limb appendicular ataxia or abnormal involuntary movements or tremors noted.  Gait: Deferred   Lab Results: Basic Metabolic Panel: No results for input(s): NA, K, CL, CO2, GLUCOSE, BUN, CREATININE, CALCIUM, MG, PHOS in the last 168 hours.  Liver Function Tests: No results for input(s): AST, ALT, ALKPHOS, BILITOT, PROT, ALBUMIN in the last 168 hours. No results for input(s): LIPASE, AMYLASE in the last 168 hours. No results for input(s): AMMONIA in the last 168 hours.  CBC:  Recent Labs Lab 05/28/15 1411  WBC 8.1  NEUTROABS 5.7  HGB 13.0  HCT 39.4  MCV 91.6  PLT 190    Cardiac Enzymes: No results for input(s): CKTOTAL, CKMB, CKMBINDEX, TROPONINI in the last 168 hours.  Lipid Panel: No results for input(s): CHOL, TRIG, HDL, CHOLHDL, VLDL, LDLCALC in the last 168 hours.  CBG: No results for input(s): GLUCAP in the last 168 hours.  Microbiology: Results for orders placed or performed during the hospital encounter of 08/04/13  MRSA PCR Screening     Status: Abnormal   Collection Time: 08/05/13 12:03 AM  Result Value Ref Range Status   MRSA by PCR POSITIVE (A) NEGATIVE Final    Comment:        The GeneXpert MRSA Assay (FDA approved for NASAL specimens only), is one component of a comprehensive MRSA colonization surveillance program. It is not intended to diagnose MRSA infection nor to guide or monitor treatment for MRSA infections. RESULT CALLED TO, READ BACK BY AND VERIFIED WITH: A. [facility] RN [phone] [id number] GREEN R     Imaging: Ct Head Code Stroke W/o Cm  05/28/2015  CLINICAL DATA:  Left-sided weakness.  Code stroke. EXAM: CT HEAD WITHOUT CONTRAST TECHNIQUE: Contiguous axial images were obtained from the base of the skull through the vertex without intravenous contrast. COMPARISON:  01/27/2015 FINDINGS: Global atrophy. Chronic ischemic changes. There is no mass effect, midline shift, or acute intracranial hemorrhage. Dense atherosclerotic  calcifications of the carotid siphons. There is dense mucous material in the left sphenoid sinus. Left mastoidectomy has been performed. Right mastoid air  cells are clear. IMPRESSION: No acute intracranial pathology. Electronically Signed   By: Jolaine Click M.D.   On: 05/28/2015 14:30    Assessment and plan:   AINHOA RALLO is an 82 y.o. female patient who presented with episode of mental status changes, being drowsy, with speech problems, and possible left sided weakness per history. No definite focal neurological finding son exam. Has cleft palate, and baseline mild palatal dysarthria, and severe deafness. Apparently in significant stress with her husband passing away last month.   Unlikely to be an acute stroke. Has known left MCA aneurysm from prior scans.  She is on several neuro-psych-pain meds, incl gabapentin, Risperidol, xanax, tramadol. Her AMS sx could be in part related to medication effect. Check metabolic labs, LFT's, ammonia, r/o infections.  CT brain  showed No acute intracranial pathology. MRI brain showed no acute stroke. MRA head showed 10 x 9 mm aneurysm at the left MCA bifurcation, and 7 x 5 mm right ICA terminus aneurysm. Recommend follow up with interventional radiologist, Dr. Lucendia Herrlich for evaluation for possible coiling of these aneurysms.

## 2015-05-28 NOTE — ED Notes (Signed)
Pt arrives from [facility] via [facility]. Pt was LSN at 1200 and sx were discovered at 1330. Staff reports pt had c/o left arm numbness and was leaning towards the left side. EMS reports pt is having expressive aphasia. Pt has a hx of CVA 3 months ago and a brain aneurism.

## 2015-05-28 NOTE — ED Notes (Signed)
Admitting at bedside 

## 2015-05-28 NOTE — H&P (Signed)
[facility] History and Physical                                                                                    [name], is a 82 y.o. female  MRN: [id number]   DOB - 01-18-1926  Admit Date - 05/28/2015  Outpatient Primary MD for the patient is Terald Sleeper, MD  Referring MD: Clarene Duke / ER  Consulting M.D: Lavon Paganini / Neurology  PMH: Past Medical History  Diagnosis Date  . Chronic back pain   . Obstructive chronic bronchitis   . Muscle weakness   . Cirrhosis (HCC)   . Cerebral aneurysm 07/2011    "non ruptured; left side; behind optical nerve"  . H/O hiatal hernia   . Complication of anesthesia     "don't like medicine or to be put to sleep"  . HOH (hard of hearing), right   . Deaf, left   . Hypertension   . Hypertensive encephalopathy   . Pacemaker     'as a child"  . DVT (deep venous thrombosis), right 1970's    "not long after knee OR"  . Emphysema   . Sleep apnea     "doesn't wear mask"  . Exertional dyspnea   . Shortness of breath 08/15/11    "& when lying down"  . GERD (gastroesophageal reflux disease)   . Hepatitis   . Steatohepatitis, non-alcoholic   . Headache(784.0)   . Migraines   . Scoliosis   . Osteoarthritis of lumbar spine   . History of shingles 05/2010      PSH: Past Surgical History  Procedure Laterality Date  . Vaginal hysterectomy  1980's  . Knee arthroscopy  ?1970's    right  . Tympanoplasty  1950's    removed; left  . Myringotomy  08/2011    right  . Dilation and curettage of uterus  1970's     CC:  Chief Complaint  Patient presents with  . Code Stroke     HPI: 82 year old female patient with history of deafness and cleft palate, [name] without alcohol, optic aneurysm, hypertension, sleep apnea noncompliant with CPAP who was sent to the ER from her nursing facility for altered mentation and focal weakness. Patient unable to contribute to history but family at bedside states they were notified by nursing staff  that this morning the patient had left hand numbness and was unable to lift the left arm and was also complaining of lateral leg numbness and unable to walk. At baseline she mobilizes with a walker. She was also expressing low-grade temperature 99.0. Patient's husband died 08-Feb-2024and she is also lost to other family members in the past few months. Since that time patient has had failure to thrive symptoms with poor oral intake and weight loss. She's also been having some altered mentation with paranoid behaviors. The daughter reports that this morning prior to transport to the nursing home she was told the patient had diarrhea. The best of their knowledge the patient has not had any coughs cold fevers chills or abdominal pain or nausea or vomiting. In my interaction with the patient  ER Evaluation and treatment: Temperature 98.2,  BP 147/66, pulse 109, respirations 20, room air saturations 99% EKG: Sinus tachycardia with ventricular rate 117 bpm, QTC 490 ms, no acute ischemic changes, slightly peaked T waves in V3 and V4 2 View CXR: No acute abnormality CT head without contrast: No acute intracranial pathology  Review of Systems   In addition to the HPI above,  No chills, myalgias or other constitutional symptoms No Headache, changes with Vision or hearing No problems swallowing food or Liquids, indigestion/reflux No Chest pain, Cough or Shortness of Breath, palpitations, orthopnea or DOE No Abdominal pain, N/V; no melena or hematochezia, no dark tarry stools No hematuria or flank pain No new skin rashes, lesions, masses or bruises, No new joints pains-aches No polyuria, polydypsia or polyphagia,  *A full 10 point Review of Systems was done, except as stated above, all other Review of Systems were negative.  Social History Social History  Substance Use Topics  . Smoking status: Never Smoker   . Smokeless tobacco: Never Used  . Alcohol Use: No    Resides at: [facility] skilled  nursing facility  Lives with: N/A  Ambulatory status: Rolling walker   Family History History reviewed-patient unable to contribute to history due to altered mentation, family at bedside and able to recall any specific history regarding hypertension, diabetes or thyroid disease and patient's family members   Prior to Admission medications   Medication Sig Start Date End Date Taking? Authorizing Provider  amLODipine (NORVASC) 5 MG tablet Take 10 mg by mouth daily.    Yes Historical Provider, MD  clopidogrel (PLAVIX) 75 MG tablet Take 75 mg by mouth daily.   Yes Historical Provider, MD  lisinopril (PRINIVIL,ZESTRIL) 30 MG tablet Take 30 mg by mouth daily.   Yes Historical Provider, MD  pantoprazole (PROTONIX) 40 MG tablet Take 20 mg by mouth daily.    Yes Historical Provider, MD  propranolol (INDERAL LA) 160 MG SR capsule Take 160 mg by mouth daily.   Yes Historical Provider, MD  risperiDONE (RISPERDAL) 0.5 MG tablet Take 0.5 mg by mouth daily.   Yes Historical Provider, MD  ALPRAZolam (XANAX) 0.25 MG tablet Take 1 tablet (0.25 mg total) by mouth 2 (two) times daily as needed for anxiety. 03/01/15   Kirt Boys, DO  aspirin 81 MG chewable tablet Chew 81 mg by mouth daily.    Historical Provider, MD  calcium carbonate (TUMS - DOSED IN MG ELEMENTAL CALCIUM) 500 MG chewable tablet Chew 1 tablet by mouth every 4 (four) hours as needed for indigestion or heartburn.    Historical Provider, MD  calcium-vitamin D (OSCAL WITH D) 500-200 MG-UNIT per tablet Take 1 tablet by mouth 2 (two) times daily.    Historical Provider, MD  cloNIDine (CATAPRES) 0.1 MG tablet Take 0.1 mg by mouth every 6 (six) hours as needed. For systolic BP greater than or equal to 170    Historical Provider, MD  docusate sodium (COLACE) 100 MG capsule Take 100 mg by mouth 2 (two) times daily.    Historical Provider, MD  gabapentin (NEURONTIN) 300 MG capsule Take 300 mg by mouth at bedtime. Takes along with 400mg  to =700mg      Historical Provider, MD  gabapentin (NEURONTIN) 400 MG capsule Take 400 mg by mouth at bedtime. Take with 300 mg cap to equal 700 mg    Historical Provider, MD  ipratropium-albuterol (DUONEB) 0.5-2.5 (3) MG/3ML SOLN Take 3 mLs by nebulization every 6 (six) hours as needed (for wheezing).    Historical Provider, MD  loratadine (CLARITIN) 10 MG tablet Take 10 mg by mouth daily as needed for allergies.    Historical Provider, MD  methylPREDNISolone (MEDROL DOSEPAK) 4 MG tablet Take 4 mg TID for one day then take 4 mg QID for 2 days then take 8 mg daily for two days then stop. 08/06/13   Belkys A Regalado, MD  Multiple Vitamin (MULITIVITAMIN WITH MINERALS) TABS Take 1 tablet by mouth daily.    Historical Provider, MD  prazosin (MINIPRESS) 1 MG capsule Take 1 mg by mouth 2 (two) times daily. Take 2 mg capsule along with 1 mg capsule for total of 3 mg twice daily    Historical Provider, MD  prazosin (MINIPRESS) 2 MG capsule Take 2 mg by mouth 2 (two) times daily. Take 2 mg capsule along with 1 mg capsule for total of 3 mg twice daily    Historical Provider, MD    Allergies  Allergen Reactions  . Acetaminophen Nausea And Vomiting  . Penicillins Anaphylaxis  . Vicodin [Hydrocodone-Acetaminophen] Nausea And Vomiting    Physical Exam  Vitals  Blood pressure 179/89, pulse 116, temperature 98.2 F (36.8 C), temperature source Oral, resp. rate 18, weight 125 lb 14.1 oz (57.1 kg), SpO2 98 %.   General:  In no acute distress, appears chronically ill  Psych: Anxious affect, restless complaining of need to void. Alert and oriented to name uncertain if to place. Otherwise disoriented  Neuro:   No focal neurological deficits, CN II through XII intact, Strength 5/5 all 4 extremities, Sensation intact all 4 extremities.  ENT:  Ears and Eyes appear Normal, Conjunctivae clear, PER. Very dry oral mucosa without erythema or exudates.  Neck:  Supple, No lymphadenopathy appreciated  Respiratory:  Symmetrical  chest wall movement, Good air movement bilaterally, CTAB. Room Air  Cardiac:  RRR, No Murmurs, no LE edema noted, no JVD, No carotid bruits, peripheral pulses palpable at 2+  Abdomen:  Positive bowel sounds, Soft, focally tender suprapubic region associated distention distended,  No masses appreciated, no obvious hepatosplenomegaly  Genitourinary: Greater than 700 mL clear yellow urine removed with I/O catheterization by RN  Skin:  No Cyanosis, poor Skin Turgor, No Skin Rash or Bruise. Patient with erythematous changes in between both buttocks around rectum extending to gluteal folds with associated stage I decubitus  Extremities: Symmetrical without obvious trauma or injury,  no effusions.  Data Review  CBC  Recent Labs Lab 05/28/15 1411  WBC 8.1  HGB 13.0  HCT 39.4  PLT 190  MCV 91.6  MCH 30.2  MCHC 33.0  RDW 13.7  LYMPHSABS 1.7  MONOABS 0.6  EOSABS 0.1  BASOSABS 0.0    Chemistries   Recent Labs Lab 05/28/15 1411  NA 134*  K 3.7  CL 96*  CO2 25  GLUCOSE 114*  BUN 15  CREATININE 0.59  CALCIUM 8.6*  AST 19  ALT 12*  ALKPHOS 61  BILITOT 0.9    estimated creatinine clearance is 44.6 mL/min (by C-G formula based on Cr of 0.59).  No results for input(s): TSH, T4TOTAL, T3FREE, THYROIDAB in the last 72 hours.  Invalid input(s): FREET3  Coagulation profile  Recent Labs Lab 05/28/15 1411  INR 0.97    No results for input(s): DDIMER in the last 72 hours.  Cardiac Enzymes No results for input(s): CKMB, TROPONINI, MYOGLOBIN in the last 168 hours.  Invalid input(s): CK  Invalid input(s): POCBNP  Urinalysis    Component Value Date/Time   COLORURINE YELLOW 08/15/2011 1225   APPEARANCEUR  CLEAR 08/15/2011 1225   LABSPEC 1.009 08/15/2011 1225   PHURINE 8.0 08/15/2011 1225   GLUCOSEU NEGATIVE 08/15/2011 1225   HGBUR NEGATIVE 08/15/2011 1225   BILIRUBINUR NEGATIVE 08/15/2011 1225   KETONESUR NEGATIVE 08/15/2011 1225   PROTEINUR NEGATIVE 08/15/2011  1225   UROBILINOGEN 0.2 08/15/2011 1225   NITRITE NEGATIVE 08/15/2011 1225   LEUKOCYTESUR NEGATIVE 08/15/2011 1225    Imaging results:   Dg Chest 2 View  05/28/2015  CLINICAL DATA:  Altered mental status. EXAM: CHEST  2 VIEW COMPARISON:  08/04/2013. FINDINGS: Normal sized heart. Clear lungs. Mildly tortuous and calcified thoracic aorta. Diffuse osteopenia. IMPRESSION: No acute abnormality. Electronically Signed   By: Beckie Salts M.D.   On: 05/28/2015 15:38   Ct Head Code Stroke W/o Cm  05/28/2015  CLINICAL DATA:  Left-sided weakness.  Code stroke. EXAM: CT HEAD WITHOUT CONTRAST TECHNIQUE: Contiguous axial images were obtained from the base of the skull through the vertex without intravenous contrast. COMPARISON:  01/27/2015 FINDINGS: Global atrophy. Chronic ischemic changes. There is no mass effect, midline shift, or acute intracranial hemorrhage. Dense atherosclerotic calcifications of the carotid siphons. There is dense mucous material in the left sphenoid sinus. Left mastoidectomy has been performed. Right mastoid air cells are clear. IMPRESSION: No acute intracranial pathology. Electronically Signed   By: Jolaine Click M.D.   On: 05/28/2015 14:30     EKG: (Independently reviewed) Sinus tachycardia with ventricular rate 117 bpm, QTC 490 ms, no acute ischemic changes, slightly peaked T waves in V3 and V4   Assessment & Plan  Principal Problem:   TIA (transient ischemic attack) -Symptoms prior to arrival concerning for TIA with focal weakness and numbness now resolved -Admit to telemetry/Obs -Appreciate neurological evaluation -There may be a degree of overmedication/undesired side effects from medications contributing to current symptomatology-as precaution I have decreased bedtime Neurontin from 700-300 mg and have placed her Xanax on hold but will continue her Risperdal -CT head negative -MRI/MRA brain-if positive will pursue carotid duplex and echocardiogram -PT/OT/SLP  evaluation -Aspirin-was on Plavix prior to admission -Family reports that 4-5 months ago patient had some mental status changes and imaging was obtained (outpatient CT?). 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  Active Problems:   HTN  -Currently controlled -Patient on multiple medications for blood pressure -Continue Norvasc, lisinopril and Inderal -Has 2 separate doses for Minipress so have not ordered this medication    Acute urinary retention -Upon examination of patient she kept complaining of need to void and on exam was found to have distended bladder and suprapubic tenderness -I/O catheterization was performed by RN with large-volume clear yellow urine returned -Check urinalysis and culture -If develops recurrent urinary retention will need to insert Foley catheter -Suspect high-dose Neurontin contributing (neurogenic bladder) as well as apparent frequent use of Xanax prn   Adult failure to thrive -Patient recently lost her husband and 2 other family members as well in the past few months -Patient has lost weight and appears clinically dehydrated according to family has not been eating -Continue IV fluids -Nutrition consultation -Patient has a history of recurrent delirium and episodic paranoia may have underlying psychiatric disorder-continue Risperdal -Apparently had episode of diarrhea prior to admission. 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 ANP on 05/28/2015 at 4:21 PM  You may contact me by going to [email or web address] - password [email or web address]  I am available from 7a-7p but please confirm I am on the schedule by going to Amion as above.   After 7p please contact night coverage person covering me after hours  [facility]

## 2015-05-29 ENCOUNTER — Observation Stay (HOSPITAL_COMMUNITY): Payer: Medicare Other

## 2015-05-29 DIAGNOSIS — I10 Essential (primary) hypertension: Secondary | ICD-10-CM | POA: Diagnosis not present

## 2015-05-29 DIAGNOSIS — R338 Other retention of urine: Secondary | ICD-10-CM | POA: Diagnosis not present

## 2015-05-29 DIAGNOSIS — I671 Cerebral aneurysm, nonruptured: Secondary | ICD-10-CM | POA: Diagnosis not present

## 2015-05-29 DIAGNOSIS — G4733 Obstructive sleep apnea (adult) (pediatric): Secondary | ICD-10-CM | POA: Diagnosis not present

## 2015-05-29 DIAGNOSIS — H9190 Unspecified hearing loss, unspecified ear: Secondary | ICD-10-CM

## 2015-05-29 DIAGNOSIS — R531 Weakness: Secondary | ICD-10-CM | POA: Diagnosis not present

## 2015-05-29 DIAGNOSIS — L8991 Pressure ulcer of unspecified site, stage 1: Secondary | ICD-10-CM | POA: Diagnosis not present

## 2015-05-29 DIAGNOSIS — R4182 Altered mental status, unspecified: Secondary | ICD-10-CM

## 2015-05-29 DIAGNOSIS — R29898 Other symptoms and signs involving the musculoskeletal system: Secondary | ICD-10-CM

## 2015-05-29 LAB — MRSA PCR SCREENING: MRSA by PCR: NEGATIVE

## 2015-05-29 LAB — LIPID PANEL
Cholesterol: 146 mg/dL (ref 0–200)
HDL: 42 mg/dL (ref >40)
LDL Cholesterol: 81 mg/dL (ref 0–99)
TRIGLYCERIDES: 117 mg/dL (ref <150)
Total CHOL/HDL Ratio: 3.5 RATIO
VLDL: 23 mg/dL (ref 0–40)

## 2015-05-29 MED ORDER — ENSURE ENLIVE PO LIQD
237.0000 mL | Freq: Two times a day (BID) | ORAL | Status: DC
  Administered 2015-05-29 – 2015-05-31 (×4): 237 mL via ORAL

## 2015-05-29 MED ORDER — ZOLPIDEM TARTRATE 5 MG PO TABS: 5.0000 mg | ORAL_TABLET | Freq: Once | ORAL | Status: DC

## 2015-05-29 MED ORDER — LORAZEPAM 2 MG/ML IJ SOLN
1.0000 mg | Freq: Once | INTRAMUSCULAR | Status: AC
  Administered 2015-05-29: 1 mg via INTRAVENOUS
  Filled 2015-05-29: qty 1

## 2015-05-29 MED ORDER — LABETALOL HCL 5 MG/ML IV SOLN
5.0000 mg | INTRAVENOUS | Status: DC | PRN
  Filled 2015-05-29: qty 4

## 2015-05-29 MED ORDER — METOPROLOL TARTRATE 1 MG/ML IV SOLN
2.5000 mg | Freq: Once | INTRAVENOUS | Status: AC
  Administered 2015-05-29: 2.5 mg via INTRAVENOUS
  Filled 2015-05-29: qty 5

## 2015-05-29 MED ORDER — LORAZEPAM 2 MG/ML IJ SOLN
1.0000 mg | Freq: Once | INTRAMUSCULAR | Status: DC
Start: 2015-05-29 — End: 2015-05-29

## 2015-05-29 MED ORDER — TRAMADOL HCL 50 MG PO TABS: 50.0000 mg | ORAL_TABLET | Freq: Four times a day (QID) | ORAL | Status: DC | PRN

## 2015-05-29 NOTE — Progress Notes (Signed)
Initial Nutrition Assessment  DOCUMENTATION CODES:   Not applicable  INTERVENTION:  Provide Ensure Enlive po BID, each supplement provides 350 kcal and 20 grams of protein.  Encourage adequate PO intake.   NUTRITION DIAGNOSIS:   Increased nutrient needs related to chronic illness as evidenced by estimated needs.  GOAL:   Patient will meet greater than or equal to 90% of their needs  MONITOR:   PO intake, Supplement acceptance, Weight trends, Labs, I & O's, Skin  REASON FOR ASSESSMENT:   Consult Assessment of nutrition requirement/status  ASSESSMENT:   82 year old female patient with history of deafness and cleft palate, [name] without alcohol, optic aneurysm, hypertension, sleep apnea noncompliant with CPAP who was sent to the ER from her nursing facility for altered mentation and focal weakness.  Diet has been advanced to a soft diet. Pt has not received meal tray during time of visit, however was able to consumption half an Ensure. Pt reports not eating well at her nursing facility and reports only eating a couple of bites of food at meals. RD unable to determine time frame of poor po. Pt was difficult to understand at times. Pt reports usual body weight of ~133 lbs. Pt with a 11.2% weight loss from usual weight. RD to order Ensure to aid in caloric and protein needs. Pt was encouraged to eat her food at meals.   Nutrition-Focused physical exam completed. Findings are no fat depletion, moderate muscle depletion, and no edema. Depletion likely associated with the natural aging process.   Labs and medications reviewed.   Diet Order:  DIET SOFT Room service appropriate?: Yes; Fluid consistency:: Thin  Skin:  Wound (see comment) (Stage I pressure ulcer on R buttocks)  Last BM:  2/19  Height:   Ht Readings from Last 1 Encounters:  01/27/15  (1.651 m)    Weight:   Wt Readings from Last 1 Encounters:  05/29/15 118 lb 2.7 oz (53.6 kg)    Ideal Body Weight:  56.8  kg  BMI:  Body mass index is 19.66 kg/(m^2).  Estimated Nutritional Needs:   Kcal:  1500-1700  Protein:  70-85 grams  Fluid:  >/= 1.5 L/day  EDUCATION NEEDS:   No education needs identified at this time  Roslyn Smiling, MS, RD, LDN Pager # [phone] After hours/ weekend pager # [phone]

## 2015-05-29 NOTE — Evaluation (Signed)
Clinical/Bedside Swallow Evaluation Patient Details  Name: [name] MRN: [id number] Date of Birth: [birth year]  Today's Date: 05/29/2015 Time: SLP Start Time (ACUTE ONLY): 5621 SLP Stop Time (ACUTE ONLY): 1012 SLP Time Calculation (min) (ACUTE ONLY): 17 min  Past Medical History:  Past Medical History  Diagnosis Date  . Chronic back pain   . Obstructive chronic bronchitis   . Muscle weakness   . Cirrhosis (HCC)   . Cerebral aneurysm 07/2011    "non ruptured; left side; behind optical nerve"  . H/O hiatal hernia   . Complication of anesthesia     "don't like medicine or to be put to sleep"  . HOH (hard of hearing), right   . Deaf, left   . Hypertension   . Hypertensive encephalopathy   . Pacemaker     'as a child"  . DVT (deep venous thrombosis), right 1970's    "not long after knee OR"  . Emphysema   . Sleep apnea     "doesn't wear mask"  . Exertional dyspnea   . Shortness of breath 08/15/11    "& when lying down"  . GERD (gastroesophageal reflux disease)   . Hepatitis   . Steatohepatitis, non-alcoholic   . Headache(784.0)   . Migraines   . Scoliosis   . Osteoarthritis of lumbar spine   . History of shingles 05/2010   Past Surgical History:  Past Surgical History  Procedure Laterality Date  . Vaginal hysterectomy  1980's  . Knee arthroscopy  ?1970's    right  . Tympanoplasty  1950's    removed; left  . Myringotomy  08/2011    right  . Dilation and curettage of uterus  1970's   HPI:  82 year old female with hypertension, sleep apnea, was brought from the nursing home for weakness, confusion, and left arm numbness and decreased coordination on the left arm. Family denies that patient has any history of dementia, but do endorse that she has been very paranoid recently about someone trying to kill her. She has been having a lot of stressors recently, her husband as well as few other family members have died in the last couple months, she has been more withdrawn as  well as decreased by mouth intake in this interval. Patient herself is very hard of hearing, she has a cleft palate only, unrepaired, with hypernasal speech with nasal emission. Pt reports this has never impacted her swallow function.    Assessment / Plan / Recommendation Clinical Impression  Pt presents with functional oropharyngeal swallow that appears to be at baseline per pt and dtr report.  Mastication is adequate.  Pt is not a "big eater" per her dtr; and consumes tiny bites of food.  Swallow response appears to be brisk.  There are no overt s/s of aspiration.  No focal deficits. Pt prefers a softer diet; she rarely eats meats.  Recommend initiating soft diet, all liquids.  No SLP f/u required.  No speech/language evaluation needed - pt's cognition is at baseline per dtr.  Will sign off.     Aspiration Risk       Diet Recommendation   soft diet, thin liquids  Medication Administration: Whole meds with liquid    Other  Recommendations Oral Care Recommendations: Oral care BID   Follow up Recommendations  None    Frequency and Duration            Prognosis        Swallow Study   General Date of  Onset: 05/28/15 HPI: 82 year old female with hypertension, sleep apnea, was brought from the nursing home for weakness, confusion, and left arm numbness and decreased coordination on the left arm. Family denies that patient has any history of dementia, but do endorse that she has been very paranoid recently about someone trying to kill her. She has been having a lot of stressors recently, her husband as well as few other family members have died in the last couple months, she has been more withdrawn as well as decreased by mouth intake in this interval. Patient herself is very hard of hearing, she has a cleft palate only, unrepaired, with hypernasal speech with nasal emission. Pt reports this has never impacted her swallow function.  Type of Study: Bedside Swallow Evaluation Previous Swallow  Assessment: 2013 after CVA with normal swallow Diet Prior to this Study: NPO Temperature Spikes Noted: No Respiratory Status: Room air History of Recent Intubation: No Behavior/Cognition: Alert;Cooperative;Pleasant mood Oral Cavity Assessment:  (cleft palate) Oral Care Completed by SLP: No Oral Cavity - Dentition: Dentures, top;Dentures, bottom Vision: Functional for self-feeding Self-Feeding Abilities: Able to feed self Patient Positioning: Upright in chair Baseline Vocal Quality:  (hypernasal resonance due to cleft palate) Volitional Cough: Strong Volitional Swallow: Able to elicit    Oral/Motor/Sensory Function Overall Oral Motor/Sensory Function: Within functional limits   Ice Chips Ice chips: Within functional limits   Thin Liquid Thin Liquid: Within functional limits Presentation: Cup;Self Fed    Nectar Thick Nectar Thick Liquid: Not tested   Honey Thick Honey Thick Liquid: Not tested   Puree Puree: Within functional limits Presentation: Self Fed   Solid   GO   Solid: Within functional limits Presentation: Self Fed    Functional Assessment Tool Used: clinical judgment Functional Limitations: Swallowing Swallow Current Status (Z3086): 0 percent impaired, limited or restricted Swallow Goal Status (V7846): 0 percent impaired, limited or restricted Swallow Discharge Status [phone]): 0 percent impaired, limited or restricted   Blenda Mounts Laurice 05/29/2015,10:16 AM   Marchelle Folks L. Samson Frederic, [place] CCC/SLP Pager [phone]

## 2015-05-29 NOTE — Care Management Obs Status (Signed)
MEDICARE OBSERVATION STATUS NOTIFICATION   Patient Details  Name: [name] MRN: [id number] Date of Birth: 26-Dec-1925   Medicare Observation Status Notification Given:  Yes    Monica Becton, RN 05/29/2015, 1:33 PM

## 2015-05-29 NOTE — Procedures (Signed)
HPI:  82 year old female with hypertension, sleep apnea, was brought from the nursing home for weakness, confusion, and left arm numbness and decreased coordination on the left arm. Family denies that patient has any history of dementia, but do endorse that she has been very paranoid recently about someone trying to kill her. She has been having a lot of stressors recently, her husband as well as few other family members have died in the last couple months, she has been more withdrawn as well as decreased by mouth intake in this interval. Patient herself is very hard of hearing, she has a cleft palate and is very difficult to understand her, however denies any chest pain, denies any shortness of breath, no abdominal pain, nausea, vomiting. She does endorse diarrhea today, had 3 episodes of loose stools. In the ED her vital signs are stable other than mild sinus tachycardia, her blood work is fairly unremarkable, she has no leukocytosis.   TECHNICAL SUMMARY:  A multichannel referential and bipolar montage EEG using the standard international 10-20 system was performed on the patient described as awake and drowsy.  The dominant background activity consists of 9 hertz activity seen most prominantly over the posterior head region.  The backgound activity is reactive to eye opening and closing procedures.  Low voltage fast (beta) activity is distributed symmetrically and maximally over the anterior head regions.  ACTIVATION:  Stepwise photic stimulation at 4-21 flashes per second was performed and did not elicit any abnormal waveforms.  Hyperventilation was not performed  EPILEPTIFORM ACTIVITY:  There were no spikes, sharp waves or paroxysmal activity.  SLEEP:  none  CARDIAC:  The EKG lead revealed a regular sinus rhythm.  IMPRESSION:  This is a normal EEG for the patients stated age.  There were no focal, hemispheric or lateralizing features.  No epileptiform activity was recorded.  A normal EEG does not  exclude the diagnosis of a seizure disorder and if seizure remains high on the list of differential diagnosis, an ambulatory EEG may be of value.  Clinical correlation is required.

## 2015-05-29 NOTE — Clinical Social Work Note (Addendum)
CSW met with patient and her daughter, they would like to return to [facility].  CSW updated [name] that patient is not ready today, [name] said they can accept patient once she is medically ready for discharge.  Formal assessment to follow, DNR has been signed by physician and on medical chart.  Jones Broom. [place], MSW, [place] 05/29/2015 6:21 PM

## 2015-05-29 NOTE — Evaluation (Addendum)
Physical Therapy Evaluation Patient Details Name: CHAIRTY TOMAN MRN: [id number] DOB: [birth year] Today's Date: 05/29/2015   History of Present Illness  Pt admitted from SNF with LUE weakness. PMHx: sleep apnea, HTN, COPD, HOH, cleft palate  Clinical Impression  Pt pleasant, HOH and stating she did not sleep last night with concerns about NPO status currently. Pt with generalized weakness who reports she had "kidney pain" leading to admission. Pt does not currently endorse weakness greater in any extremity and demonstrates generalized weakness all extremities and kyphosis. Pt pleasant and able to ambulate short distance this AM. Pt with decreased strength and activity tolerance who will benefit from acute therapy to maximize mobility and function prior to return to SNF to decrease burden of care.     Follow Up Recommendations Supervision/Assistance - 24 hour;SNF    Equipment Recommendations  None recommended by PT    Recommendations for Other Services       Precautions / Restrictions Precautions Precautions: Fall      Mobility  Bed Mobility Overal bed mobility: Needs Assistance Bed Mobility: Rolling;Sidelying to Sit Rolling: Min assist Sidelying to sit: Min assist       General bed mobility comments: cues for sequence with use of rail, assist to rotate pelvis and elevate trunk with increased time. Pt able to scoot to EOB with cues  Transfers Overall transfer level: Needs assistance   Transfers: Sit to/from Stand Sit to Stand: Min assist         General transfer comment: cues for hand placement and safety with assist for anterior translation and rise from surface  Ambulation/Gait Ambulation/Gait assistance: Min assist Ambulation Distance (Feet): 20 Feet Assistive device: Rolling walker (2 wheeled) Gait Pattern/deviations: Step-through pattern;Shuffle;Narrow base of support;Trunk flexed   Gait velocity interpretation: Below normal speed for age/gender General Gait  Details: cues for posture, position in RW and assist to direct and steer RW  Stairs            Wheelchair Mobility    Modified Rankin (Stroke Patients Only) Modified Rankin (Stroke Patients Only) Pre-Morbid Rankin Score: Moderately severe disability Modified Rankin: Moderately severe disability     Balance Overall balance assessment: Needs assistance   Sitting balance-Leahy Scale: Fair       Standing balance-Leahy Scale: Poor                               Pertinent Vitals/Pain Pain Assessment: No/denies pain    Home Living Family/patient expects to be discharged to:: Skilled nursing facility [facility])                      Prior Function Level of Independence: Needs assistance   Gait / Transfers Assistance Needed: pt reports min assist for OOB, transfers and able to walk with rollator with minguard to supervision grossly 50'  ADL's / Homemaking Assistance Needed: assist for bathing and dressing        Hand Dominance        Extremity/Trunk Assessment   Upper Extremity Assessment: Generalized weakness           Lower Extremity Assessment: Generalized weakness      Cervical / Trunk Assessment: Kyphotic  Communication   Communication: HOH;Expressive difficulties  Cognition Arousal/Alertness: Awake/alert Behavior During Therapy: WFL for tasks assessed/performed Overall Cognitive Status: No family/caregiver present to determine baseline cognitive functioning  General Comments      Exercises        Assessment/Plan    PT Assessment Patient needs continued PT services  PT Diagnosis Difficulty walking;Generalized weakness   PT Problem List Decreased strength;Decreased activity tolerance  PT Treatment Interventions Gait training;Functional mobility training;Therapeutic activities;Therapeutic exercise;Patient/family education   PT Goals (Current goals can be found in the Care Plan section)  Acute Rehab PT Goals Patient Stated Goal: return home PT Goal Formulation: With patient Time For Goal Achievement: 06/12/15 Potential to Achieve Goals: Fair    Frequency Min 3X/week   Barriers to discharge        Co-evaluation               End of Session Equipment Utilized During Treatment: Gait belt Activity Tolerance: Patient tolerated treatment well Patient left: in chair;with call bell/phone within reach;with chair alarm set Nurse Communication: Mobility status    Functional Assessment Tool Used: clinical judgement Functional Limitation: Mobility: Walking and moving around Mobility: Walking and Moving Around Current Status [phone]): At least 20 percent but less than 40 percent impaired, limited or restricted Mobility: Walking and Moving Around Goal Status [phone]): At least 20 percent but less than 40 percent impaired, limited or restricted    Time: 0743-0804 PT Time Calculation (min) (ACUTE ONLY): 21 min   Charges:   PT Evaluation $PT Eval Moderate Complexity: 1 Procedure     PT G Codes:   PT G-Codes **NOT FOR INPATIENT CLASS** Functional Assessment Tool Used: clinical judgement Functional Limitation: Mobility: Walking and moving around Mobility: Walking and Moving Around Current Status (U9811): At least 20 percent but less than 40 percent impaired, limited or restricted Mobility: Walking and Moving Around Goal Status [phone]): At least 20 percent but less than 40 percent impaired, limited or restricted    Delorse Lek 05/29/2015, 8:23 AM Delaney Meigs, PT [phone]

## 2015-05-29 NOTE — Consult Note (Signed)
WOC wound consult note Reason for Consult: pressure injury Patient is deaf and very difficult to understand.  Family at bedside reports she has been less ambulatory lately and has not been eating well.  She can stand for me with the walker, she uses a walker for ambulation routinely.  Wound type: Stage 1 Pressure Injury Pressure Ulcer POA: Yes Measurement: 7cm x 5.5cm x 0 Wound bed: intact red skin, does not blanch Drainage (amount, consistency, odor) none Periwound: intact  Dressing procedure/placement/frequency: Add silicone foam to protect area, add chair pressure redistribution pad which she can take with her back to the SNF for use.   Discussed POC with patient and bedside nurse.  Re consult if needed, will not follow at this time. Thanks  Kim Oki [employer], CWOCN [phone])

## 2015-05-29 NOTE — Progress Notes (Signed)
EEG completed; results pending.    

## 2015-05-29 NOTE — Progress Notes (Signed)
[facility] PROGRESS NOTE  [name] ZOX:[id number] DOB: 1925/05/04 DOA: 05/28/2015 PCP: Terald Sleeper, MD  HPI/Brief narrative 82 year old female with hypertension, sleep apnea, was brought from the nursing home for weakness, confusion, and left arm numbness and decreased coordination on the left arm. Patient was admitted for neuro work up.  Assessment/Plan: L arm weakness -Symptoms prior to arrival were initially concerning for TIA with focal weakness and numbness now resolved -Neurology consulted, still awaiting input -EEG normal -There may be a degree of overmedication/undesired side effects from medications contributing to current symptomatology-as precaution I have decreased bedtime Neurontin from 700-300 mg and have placed her Xanax on hold but will continue her Risperdal -CT head negative -MRI/MRA brain neg for CVA - notable for known old/prior CVA -PT/OT/SLP evaluation -Aspirin-was on Plavix prior to admission -Family reports that 4-5 months ago patient had some mental status changes and imaging was obtained (outpatient CT?). 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   HTN  -Currently controlled -Patient on multiple medications for blood pressure -Continue Norvasc, lisinopril and Inderal -Has 2 separate doses for Minipress so have not ordered this medication   Acute urinary retention -I/O catheterization was performed by RN with large-volume clear yellow urine returned -Check urinalysis neg for UTI -Suspect high-dose Neurontin contributing (neurogenic bladder) as well as apparent frequent use of Xanax prn  Adult failure to thrive -Patient recently lost her husband and 2 other family members as well in the past few months -Patient has lost weight and appears clinically dehydrated according to family has not been eating -Continue IV fluids -Nutrition consultation -Patient has a history of recurrent delirium and episodic  paranoia may have underlying psychiatric disorder-continue Risperdal   COPD  -Currently compensated without wheezing   NASH  -Ammonia level 34 and LFTs not elevated   Stage 1 decubitus ulcer/buttock -Wound care consultation -Mattress replacement   Aneurysm optic -No acute visual changes   OSA (obstructive sleep apnea) -Noncompliant with CPAP   Deaf (PARTIAL) -Able to verbally communicate although speech thick and sometimes difficult to understand  Code Status: DNR Family Communication: Pt in room, daughter at bedside Disposition Plan: Possible d/c in 24hrs, return to SNF   Consultants:  Neurology  Procedures:    Antibiotics: Anti-infectives    None      HPI/Subjective: No complaints. Feels better  Objective: Filed Vitals:   05/28/15 1715 05/28/15 2211 05/29/15 0428 05/29/15 1353  BP: 154/77 141/86 119/83 155/78  Pulse: 115 122 153 98  Temp:  97.8 F (36.6 C) 98 F (36.7 C) 97.8 F (36.6 C)  TempSrc:  Oral Oral   Resp:  Weight:   53.6 kg (118 lb 2.7 oz)   SpO2: 97% 98% 97% 97%    Intake/Output Summary (Last 24 hours) at 05/29/15 1843 Last data filed at 05/29/15 1342  Gross per 24 hour  Intake 1135.83 ml  Output    480 ml  Net 655.83 ml   Filed Weights   05/28/15 1400 05/29/15 0428  Weight: 57.1 kg (125 lb 14.1 oz) 53.6 kg (118 lb 2.7 oz)    Exam:   General:  Awake, in nad  Cardiovascular: regular, s1, s2  Respiratory: normal resp effort, no wheezing  Abdomen: soft, nondistended  Musculoskeletal: perfused, no clubbing   Data Reviewed: Basic Metabolic Panel:  Recent Labs Lab 05/28/15 1411  NA 134*  K 3.7  CL 96*  CO2 25  GLUCOSE 114*  BUN 15  CREATININE 0.59  CALCIUM 8.6*   Liver Function Tests:  Recent Labs Lab 05/28/15 1411  AST 19  ALT 12*  ALKPHOS 61  BILITOT 0.9  PROT 6.0*  ALBUMIN 3.5   No results for input(s): LIPASE, AMYLASE in the last 168 hours.  Recent Labs Lab 05/28/15 1411   AMMONIA 24   CBC:  Recent Labs Lab 05/28/15 1411  WBC 8.1  NEUTROABS 5.7  HGB 13.0  HCT 39.4  MCV 91.6  PLT 190   Cardiac Enzymes: No results for input(s): CKTOTAL, CKMB, CKMBINDEX, TROPONINI in the last 168 hours. BNP (last 3 results) No results for input(s): BNP in the last 8760 hours.  ProBNP (last 3 results) No results for input(s): PROBNP in the last 8760 hours.  CBG: No results for input(s): GLUCAP in the last 168 hours.  Recent Results (from the past 240 hour(s))  Urine culture     Status: None (Preliminary result)   Collection Time: 05/28/15  4:06 PM  Result Value Ref Range Status   Specimen Description URINE, CLEAN CATCH  Final   Special Requests NONE  Final   Culture NO GROWTH < 24 HOURS  Final   Report Status PENDING  Incomplete  MRSA PCR Screening     Status: None   Collection Time: 05/29/15 11:33 AM  Result Value Ref Range Status   MRSA by PCR NEGATIVE NEGATIVE Final    Comment:        The GeneXpert MRSA Assay (FDA approved for NASAL specimens only), is one component of a comprehensive MRSA colonization surveillance program. It is not intended to diagnose MRSA infection nor to guide or monitor treatment for MRSA infections.      Studies: Dg Chest 2 View  05/28/2015  CLINICAL DATA:  Altered mental status. EXAM: CHEST  2 VIEW COMPARISON:  08/04/2013. FINDINGS: Normal sized heart. Clear lungs. Mildly tortuous and calcified thoracic aorta. Diffuse osteopenia. IMPRESSION: No acute abnormality. Electronically Signed   By: Beckie Salts M.D.   On: 05/28/2015 15:38   Mr Maxine Glenn Head Wo Contrast  05/28/2015  CLINICAL DATA:  Initial evaluation for weakness, confusion, left arm numbness. EXAM: MRI HEAD WITHOUT CONTRAST MRA HEAD WITHOUT CONTRAST TECHNIQUE: Multiplanar, multiecho pulse sequences of the brain and surrounding structures were obtained without intravenous contrast. Angiographic images of the head were obtained using MRA technique without contrast.  COMPARISON:  Prior CT from earlier the same day. FINDINGS: MRI HEAD FINDINGS No abnormal foci of restricted diffusion to suggest acute intracranial infarct. Gray-white matter differentiation maintained. Major intracranial vascular flow voids are preserved. No acute or chronic intracranial hemorrhage. Small chronic lacunar infarction within the right thalamus. No other areas of chronic infarction identified. Diffuse prominence of the CSF containing spaces is compatible with generalized age-related cerebral atrophy, similar to prior. Patchy T2/FLAIR hyperintensity within the periventricular and deep white matter both cerebral hemispheres most consistent with chronic small vessel ischemic disease, also similar. Small vessel type changes present within the pons. No mass lesion, midline shift, or mass effect. No hydrocephalus. No extra-axial fluid collection. Craniocervical junction within normal limits. Visualized upper cervical spine unremarkable. Pituitary gland normal.  No acute abnormality about the orbits. Chronic opacification of the left sphenoid sinus present. Scattered mucosal thickening within the ethmoidal air cells. Paranasal sinuses are otherwise clear. Minimal trace opacity within the bilateral mastoid air cells. Inner ear structures grossly normal. Bone marrow signal intensity within normal limits. Lipoma at the right frontal scalp. No acute scalp soft tissue abnormality. MRA HEAD FINDINGS  ANTERIOR CIRCULATION: Study degraded by motion artifact. Visualized distal cervical segments of the internal carotid arteries are patent with antegrade flow. Petrous segments within normal limits. Marked did ectasia of the cavernous segments bilaterally. Supraclinoid right ICA 8 mildly ectatic as well. Focal globular somewhat irregular aneurysm measuring 7 x 5 mm at the right ICA terminus near the takeoff of the right posterior communicating artery. A1 segments tortuous but patent. Anterior communicating artery normal.  Azygos ACA noted, patent at its proximal mid aspect. M1 segments patent without occlusion or high-grade stenosis. Right MCA bifurcation normal. Aneurysm at the left MCA bifurcation measures approximately 10 x 9 mm. Distal MCA branches not well evaluated on this exam, but grossly patent. A1 segments patent. POSTERIOR CIRCULATION: Vertebral arteries patent to the vertebrobasilar junction. Left vertebral artery slightly dominant. Posterior inferior cerebral arteries patent. Basilar artery tortuous but patent to its distal aspect. Superior cerebellar arteries patent bilaterally. Prominent right posterior communicating artery supplies the right PCA. Left PCA arises from the basilar artery. There is a focal severe stenosis within the proximal left P2 segment. Left PCA is opacified to its distal aspect with multi focal atheromatous irregularity. Right PCA not well evaluated distally on this exam, but likely demonstrates multifocal atheromatous irregularity is well. IMPRESSION: MRI HEAD IMPRESSION: 1. No acute intracranial infarct or other abnormality identified. 2. Stable atrophy with chronic small vessel ischemic disease. 3. Chronic lacunar infarction within the right thalamus. 4. Chronic left sphenoid sinus disease. MRA HEAD IMPRESSION: 1. No large or proximal arterial branch occlusion within intracranial circulation. 2. Focal severe stenosis within the proximal left P2 segment. No other high-grade or correctable stenosis identified. 3. 10 x 9 mm aneurysm at the left MCA bifurcation. 4. 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 Marked ectasia of the cavernous ICAs bilaterally. Electronically Signed   By: Rise Mu M.D.   On: 05/28/2015 22:36   Mr Brain Wo Contrast  05/28/2015  CLINICAL DATA:  Initial evaluation for weakness, confusion, left arm numbness. EXAM: MRI HEAD WITHOUT CONTRAST MRA HEAD WITHOUT CONTRAST TECHNIQUE: Multiplanar, multiecho pulse sequences of the brain and surrounding structures  were obtained without intravenous contrast. Angiographic images of the head were obtained using MRA technique without contrast. COMPARISON:  Prior CT from earlier the same day. FINDINGS: MRI HEAD FINDINGS No abnormal foci of restricted diffusion to suggest acute intracranial infarct. Gray-white matter differentiation maintained. Major intracranial vascular flow voids are preserved. No acute or chronic intracranial hemorrhage. Small chronic lacunar infarction within the right thalamus. No other areas of chronic infarction identified. Diffuse prominence of the CSF containing spaces is compatible with generalized age-related cerebral atrophy, similar to prior. Patchy T2/FLAIR hyperintensity within the periventricular and deep white matter both cerebral hemispheres most consistent with chronic small vessel ischemic disease, also similar. Small vessel type changes present within the pons. No mass lesion, midline shift, or mass effect. No hydrocephalus. No extra-axial fluid collection. Craniocervical junction within normal limits. Visualized upper cervical spine unremarkable. Pituitary gland normal.  No acute abnormality about the orbits. Chronic opacification of the left sphenoid sinus present. Scattered mucosal thickening within the ethmoidal air cells. Paranasal sinuses are otherwise clear. Minimal trace opacity within the bilateral mastoid air cells. Inner ear structures grossly normal. Bone marrow signal intensity within normal limits. Lipoma at the right frontal scalp. No acute scalp soft tissue abnormality. MRA HEAD FINDINGS ANTERIOR CIRCULATION: Study degraded by motion artifact. Visualized distal cervical segments of the internal carotid arteries are patent with antegrade flow. Petrous  segments within normal limits. Marked did ectasia of the cavernous segments bilaterally. Supraclinoid right ICA 8 mildly ectatic as well. Focal globular somewhat irregular aneurysm measuring 7 x 5 mm at the right ICA terminus near  the takeoff of the right posterior communicating artery. A1 segments tortuous but patent. Anterior communicating artery normal. Azygos ACA noted, patent at its proximal mid aspect. M1 segments patent without occlusion or high-grade stenosis. Right MCA bifurcation normal. Aneurysm at the left MCA bifurcation measures approximately 10 x 9 mm. Distal MCA branches not well evaluated on this exam, but grossly patent. A1 segments patent. POSTERIOR CIRCULATION: Vertebral arteries patent to the vertebrobasilar junction. Left vertebral artery slightly dominant. Posterior inferior cerebral arteries patent. Basilar artery tortuous but patent to its distal aspect. Superior cerebellar arteries patent bilaterally. Prominent right posterior communicating artery supplies the right PCA. Left PCA arises from the basilar artery. There is a focal severe stenosis within the proximal left P2 segment. Left PCA is opacified to its distal aspect with multi focal atheromatous irregularity. Right PCA not well evaluated distally on this exam, but likely demonstrates multifocal atheromatous irregularity is well. IMPRESSION: MRI HEAD IMPRESSION: 1. No acute intracranial infarct or other abnormality identified. 2. Stable atrophy with chronic small vessel ischemic disease. 3. Chronic lacunar infarction within the right thalamus. 4. Chronic left sphenoid sinus disease. MRA HEAD IMPRESSION: 1. No large or proximal arterial branch occlusion within intracranial circulation. 2. Focal severe stenosis within the proximal left P2 segment. No other high-grade or correctable stenosis identified. 3. 10 x 9 mm aneurysm at the left MCA bifurcation. 4. 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 Marked ectasia of the cavernous ICAs bilaterally. Electronically Signed   By: Rise Mu M.D.   On: 05/28/2015 22:36   Ct Head Code Stroke W/o Cm  05/28/2015  CLINICAL DATA:  Left-sided weakness.  Code stroke. EXAM: CT HEAD WITHOUT CONTRAST TECHNIQUE:  Contiguous axial images were obtained from the base of the skull through the vertex without intravenous contrast. COMPARISON:  01/27/2015 FINDINGS: Global atrophy. Chronic ischemic changes. There is no mass effect, midline shift, or acute intracranial hemorrhage. Dense atherosclerotic calcifications of the carotid siphons. There is dense mucous material in the left sphenoid sinus. Left mastoidectomy has been performed. Right mastoid air cells are clear. IMPRESSION: No acute intracranial pathology. Electronically Signed   By: Jolaine Click M.D.   On: 05/28/2015 14:30    Scheduled Meds: . amLODipine  10 mg Oral Daily  . aspirin  81 mg Oral Daily  . calcium-vitamin D  1 tablet Oral BID  . clopidogrel  75 mg Oral Daily  . docusate sodium  100 mg Oral BID  . enoxaparin (LOVENOX) injection  40 mg Subcutaneous Q24H  . feeding supplement (ENSURE ENLIVE)  237 mL Oral BID BM  . gabapentin  300 mg Oral QHS  . lisinopril  30 mg Oral Daily  . multivitamin with minerals  1 tablet Oral Daily  . pantoprazole  20 mg Oral Daily  . propranolol ER  160 mg Oral Daily  . risperiDONE  0.5 mg Oral Daily  . zolpidem  5 mg Oral Once   Continuous Infusions: . sodium chloride Stopped (05/29/15 1342)    Principal Problem:   TIA (transient ischemic attack) Active Problems:   COPD (chronic obstructive pulmonary disease) (HCC)   HTN (hypertension)   Aneurysm, cerebral, nonruptured   NASH (nonalcoholic steatohepatitis)   OSA (obstructive sleep apnea)   Deaf (PARTIAL)   Acute urinary retention  Stage 1 decubitus ulcer/buttock   FTT (failure to thrive) in adult  CHIU, STEPHEN K  [facility] Pager [phone]. If 7PM-7AM, please contact night-coverage at [email or web address], password [facility] 05/29/2015, 6:43 PM

## 2015-05-30 DIAGNOSIS — R531 Weakness: Secondary | ICD-10-CM | POA: Diagnosis not present

## 2015-05-30 DIAGNOSIS — I671 Cerebral aneurysm, nonruptured: Secondary | ICD-10-CM | POA: Diagnosis not present

## 2015-05-30 DIAGNOSIS — R29898 Other symptoms and signs involving the musculoskeletal system: Secondary | ICD-10-CM

## 2015-05-30 DIAGNOSIS — G4733 Obstructive sleep apnea (adult) (pediatric): Secondary | ICD-10-CM | POA: Diagnosis not present

## 2015-05-30 DIAGNOSIS — I10 Essential (primary) hypertension: Secondary | ICD-10-CM | POA: Diagnosis not present

## 2015-05-30 LAB — URINE CULTURE: CULTURE: NO GROWTH

## 2015-05-30 LAB — HEMOGLOBIN A1C
HEMOGLOBIN A1C: 5.6 % (ref 4.8–5.6)
Mean Plasma Glucose: 114 mg/dL

## 2015-05-30 LAB — GLUCOSE, CAPILLARY: GLUCOSE-CAPILLARY: 94 mg/dL (ref 65–99)

## 2015-05-30 MED ORDER — GABAPENTIN 300 MG PO CAPS
300.0000 mg | ORAL_CAPSULE | Freq: Every day | ORAL | Status: AC
Start: 2015-05-30 — End: ?

## 2015-05-30 NOTE — Progress Notes (Signed)
[facility] came to transport patient to [facility]. Vital signs were taken at that time BP was 86/41. Pt was alert and not c/o dizziness. Called MD, stated to give her an hour or two and recheck BP and that it was medication induced. Pt received her daily dose of BP medications this am. MD stated that if BP comes up, pt still to be d/c to SNF today. PT[facility] will be notified when pt medically ready.

## 2015-05-30 NOTE — Discharge Summary (Addendum)
Physician Discharge Summary  [name] ZOX:[id number] DOB: Nov 02, 1925 DOA: 05/28/2015  PCP: Terald Sleeper, MD  Admit date: 05/28/2015 Discharge date: 05/31/2015  Time spent: 20 minutes  Recommendations for Outpatient Follow-up:  1. Follow up with PCP in 1-2 weeks 2. Follow up with Dr. Corliss Skains in one week, patient will be notified for appointment 3. Please ensure frequent turning to prevent pressure ulcers. May consider inflatable "doughnut" when sitting 4. Please limit/avoid sedating meds if at all possible. Neurontin dose decreased to  qhs. 5. Please ensure normal/regular urine output  6. Please monitor BP and titrate BP meds as needed  Discharge Diagnoses:  Principal Problem:   Left arm weakness Active Problems:   COPD (chronic obstructive pulmonary disease) (HCC)   HTN (hypertension)   Aneurysm, cerebral, nonruptured   NASH (nonalcoholic steatohepatitis)   OSA (obstructive sleep apnea)   Deaf (PARTIAL)   Acute urinary retention   Stage 1 decubitus ulcer/buttock   FTT (failure to thrive) in adult   Discharge Condition: Stable  Diet recommendation: soft with thin liquids  Filed Weights   05/28/15 1400 05/29/15 0428  Weight: 57.1 kg (125 lb 14.1 oz) 53.6 kg (118 lb 2.7 oz)    History of present illness:  Please review dictated H and P from 2/19 for details. Briefly, 82 year old female with hypertension, sleep apnea, was brought from the nursing home for weakness, confusion, and left arm numbness and decreased coordination on the left arm. Patient was admitted for neuro work up.  Hospital Course:  L arm weakness -Symptoms prior to arrival were initially concerning for TIA with focal weakness and numbness now resolved -Neurology consulted with recommendations that presenting symptoms likely secondary to polypharmacy with no further neuro work up recommended -EEG normal -Suspect overmedication/undesired side effects from medications contributing to  current symptomatology-as precaution, decreased bedtime Neurontin from  to 300 mg and have placed pt's Xanax on hold while admitted. -CT head negative -MRI/MRA brain neg for CVA - notable for known old/prior CVA -PT/OT/SLP evaluation -Aspirin-was on Plavix prior to admission -Family reports that 4-5 months ago patient had some mental status changes and imaging was obtained (outpatient CT?). 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   HTN  -Currently controlled -Patient on multiple medications for blood pressure -Continued lisinopril and Inderal -amlodipine stopped secondary to borderline low BP and lisinopril decreased from  to  -Please titrate bp meds accordingly as outpatient   Acute urinary retention -I/O catheterization was performed by RN with large-volume clear yellow urine returned -Check urinalysis neg for UTI -Suspect high-dose Neurontin contributing (neurogenic bladder) as well as apparent frequent use of Xanax prn  Adult failure to thrive -Patient recently lost her husband and 2 other family members as well in the past few months -Patient has lost weight and appears clinically dehydrated according to family has not been eating -Continue IV fluids -Nutrition consultation -Patient has a history of recurrent delirium and episodic paranoia may have underlying psychiatric disorder-continue Risperdal   COPD  -Currently compensated without wheezing   NASH  -Ammonia level 34 and LFTs not elevated   Stage 1 decubitus ulcer/buttock -Wound care consultation -Mattress replacement   Aneurysm optic -No acute visual changes -Family already aware of hx of chronic aneurysms involving MCA and ICA regions, although MCA aneurysm may appear slightly larger compared to 2013 study. No rupture noted - discussed case with Neurology on day of discharge who recommended IR consultation - I discussed the case with Interventional Radiology who  states patient may be discharged from their standpoint and be seen as outpatient. Follow up placed in AVS for one week with Dr. Corliss Skains   OSA (obstructive sleep apnea) -Noncompliant with CPAP   Deaf (PARTIAL) -Able to verbally communicate although speech thick and sometimes difficult to understand  Possible afib - Noted during this admission. Telemetry strip reviewed. Questionable brief episode of afib, however mostly sinus tach on the early morning of 2/20. - Per PT, patient's standing balance is rated as "Poor," thus there are concerns of fall risk and resulting traumatic/devastating head bleed - consequently, recommend continued Aspirin for stroke prevention for now - Family is in agreement with above  Consultations:  Neurology  Discharge Exam: Filed Vitals:   05/31/15 0612 05/31/15 0952 05/31/15 1100 05/31/15 1200  BP: 106/64 147/65 140/59 128/78  Pulse: 70 83  83  Temp: 98.3 F (36.8 C) 97.8 F (36.6 C)  98.1 F (36.7 C)  TempSrc:  Oral    Resp: 17   18  Weight:      SpO2: 97% 100%  100%    General: awake, in nad Cardiovascular: regular, s1, s2 Respiratory: normal resp effort, no wheezing  Discharge Instructions     Medication List    STOP taking these medications        amLODipine 5 MG tablet  Commonly known as:  NORVASC      TAKE these medications        ALPRAZolam 0.25 MG tablet  Commonly known as:  XANAX  Take 1 tablet (0.25 mg total) by mouth 2 (two) times daily as needed for anxiety.     calcium-vitamin D 500-200 MG-UNIT tablet  Commonly known as:  OSCAL WITH D  Take 1 tablet by mouth 2 (two) times daily.     cloNIDine 0.1 MG tablet  Commonly known as:  CATAPRES  Take 0.1 mg by mouth every 6 (six) hours as needed. For systolic BP greater than or equal to 170     clopidogrel 75 MG tablet  Commonly known as:  PLAVIX  Take 75 mg by mouth daily.     docusate sodium 100 MG capsule  Commonly known as:  COLACE  Take 100 mg by mouth 2 (two)  times daily.     gabapentin 300 MG capsule  Commonly known as:  NEURONTIN  Take 1 capsule (300 mg total) by mouth at bedtime.     hydrALAZINE 10 MG tablet  Commonly known as:  APRESOLINE  Take 10 mg by mouth 3 (three) times daily.     ibuprofen 200 MG tablet  Commonly known as:  ADVIL,MOTRIN  Take 400 mg by mouth 2 (two) times daily as needed for mild pain.     lisinopril 20 MG tablet  Commonly known as:  PRINIVIL,ZESTRIL  Take 1 tablet (20 mg total) by mouth daily.     loratadine 10 MG tablet  Commonly known as:  CLARITIN  Take 10 mg by mouth daily as needed for allergies.     nitroGLYCERIN 0.4 MG SL tablet  Commonly known as:  NITROSTAT  Place 0.4 mg under the tongue every 5 (five) minutes as needed for chest pain.     pantoprazole 40 MG tablet  Commonly known as:  PROTONIX  Take 20 mg by mouth daily.     propranolol ER 160 MG SR capsule  Commonly known as:  INDERAL LA  Take 160 mg by mouth daily.     risperiDONE 0.5 MG tablet  Commonly known as:  RISPERDAL  Take 0.5-1 mg by mouth daily. Take 0.5 mg every morning  Take 1 mg at bedtime     traMADol 50 MG tablet  Commonly known as:  ULTRAM  Take 50 mg by mouth every 6 (six) hours as needed for moderate pain.       Allergies  Allergen Reactions  . Acetaminophen Nausea And Vomiting  . Penicillins Anaphylaxis  . Vicodin [Hydrocodone-Acetaminophen] Nausea And Vomiting   Follow-up Information    Follow up with Terald Sleeper, MD. Schedule an appointment as soon as possible for a visit in 1 week.   Specialty:  Internal Medicine   Why:  Hospital follow up   Contact information:   [address] [place] [address] [phone]       Follow up with [facility] .   Specialty:  Skilled Nursing Facility   Contact information:   [address] [place] [address] [phone]      Follow up with Oneal Grout, MD In 1 week.   Specialty:  Interventional Radiology   Why:   pt to hear from IR scheduler for OP consultation with Dr Corliss Skains regarding unruptered cerebral aneurysm   Contact information:   [address] [place] [address] [phone]        The results of significant diagnostics from this hospitalization (including imaging, microbiology, ancillary and laboratory) are listed below for reference.    Significant Diagnostic Studies: Dg Chest 2 View  05/28/2015  CLINICAL DATA:  Altered mental status. EXAM: CHEST  2 VIEW COMPARISON:  08/04/2013. FINDINGS: Normal sized heart. Clear lungs. Mildly tortuous and calcified thoracic aorta. Diffuse osteopenia. IMPRESSION: No acute abnormality. Electronically Signed   By: Beckie Salts M.D.   On: 05/28/2015 15:38   Mr Maxine Glenn Head Wo Contrast  05/28/2015  CLINICAL DATA:  Initial evaluation for weakness, confusion, left arm numbness. EXAM: MRI HEAD WITHOUT CONTRAST MRA HEAD WITHOUT CONTRAST TECHNIQUE: Multiplanar, multiecho pulse sequences of the brain and surrounding structures were obtained without intravenous contrast. Angiographic images of the head were obtained using MRA technique without contrast. COMPARISON:  Prior CT from earlier the same day. FINDINGS: MRI HEAD FINDINGS No abnormal foci of restricted diffusion to suggest acute intracranial infarct. Gray-white matter differentiation maintained. Major intracranial vascular flow voids are preserved. No acute or chronic intracranial hemorrhage. Small chronic lacunar infarction within the right thalamus. No other areas of chronic infarction identified. Diffuse prominence of the CSF containing spaces is compatible with generalized age-related cerebral atrophy, similar to prior. Patchy T2/FLAIR hyperintensity within the periventricular and deep white matter both cerebral hemispheres most consistent with chronic small vessel ischemic disease, also similar. Small vessel type changes present within the pons. No mass lesion, midline shift, or mass effect. No  hydrocephalus. No extra-axial fluid collection. Craniocervical junction within normal limits. Visualized upper cervical spine unremarkable. Pituitary gland normal.  No acute abnormality about the orbits. Chronic opacification of the left sphenoid sinus present. Scattered mucosal thickening within the ethmoidal air cells. Paranasal sinuses are otherwise clear. Minimal trace opacity within the bilateral mastoid air cells. Inner ear structures grossly normal. Bone marrow signal intensity within normal limits. Lipoma at the right frontal scalp. No acute scalp soft tissue abnormality. MRA HEAD FINDINGS ANTERIOR CIRCULATION: Study degraded by motion artifact. Visualized distal cervical segments of the internal carotid arteries are patent with antegrade flow. Petrous segments within normal limits. Marked did ectasia of the cavernous segments bilaterally. Supraclinoid right ICA 8 mildly ectatic  as well. Focal globular somewhat irregular aneurysm measuring 7 x 5 mm at the right ICA terminus near the takeoff of the right posterior communicating artery. A1 segments tortuous but patent. Anterior communicating artery normal. Azygos ACA noted, patent at its proximal mid aspect. M1 segments patent without occlusion or high-grade stenosis. Right MCA bifurcation normal. Aneurysm at the left MCA bifurcation measures approximately 10 x 9 mm. Distal MCA branches not well evaluated on this exam, but grossly patent. A1 segments patent. POSTERIOR CIRCULATION: Vertebral arteries patent to the vertebrobasilar junction. Left vertebral artery slightly dominant. Posterior inferior cerebral arteries patent. Basilar artery tortuous but patent to its distal aspect. Superior cerebellar arteries patent bilaterally. Prominent right posterior communicating artery supplies the right PCA. Left PCA arises from the basilar artery. There is a focal severe stenosis within the proximal left P2 segment. Left PCA is opacified to its distal aspect with multi  focal atheromatous irregularity. Right PCA not well evaluated distally on this exam, but likely demonstrates multifocal atheromatous irregularity is well. IMPRESSION: MRI HEAD IMPRESSION: 1. No acute intracranial infarct or other abnormality identified. 2. Stable atrophy with chronic small vessel ischemic disease. 3. Chronic lacunar infarction within the right thalamus. 4. Chronic left sphenoid sinus disease. MRA HEAD IMPRESSION: 1. No large or proximal arterial branch occlusion within intracranial circulation. 2. Focal severe stenosis within the proximal left P2 segment. No other high-grade or correctable stenosis identified. 3. 10 x 9 mm aneurysm at the left MCA bifurcation. 4. 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 Marked ectasia of the cavernous ICAs bilaterally. Electronically Signed   By: Rise Mu M.D.   On: 05/28/2015 22:36   Mr Brain Wo Contrast  05/28/2015  CLINICAL DATA:  Initial evaluation for weakness, confusion, left arm numbness. EXAM: MRI HEAD WITHOUT CONTRAST MRA HEAD WITHOUT CONTRAST TECHNIQUE: Multiplanar, multiecho pulse sequences of the brain and surrounding structures were obtained without intravenous contrast. Angiographic images of the head were obtained using MRA technique without contrast. COMPARISON:  Prior CT from earlier the same day. FINDINGS: MRI HEAD FINDINGS No abnormal foci of restricted diffusion to suggest acute intracranial infarct. Gray-white matter differentiation maintained. Major intracranial vascular flow voids are preserved. No acute or chronic intracranial hemorrhage. Small chronic lacunar infarction within the right thalamus. No other areas of chronic infarction identified. Diffuse prominence of the CSF containing spaces is compatible with generalized age-related cerebral atrophy, similar to prior. Patchy T2/FLAIR hyperintensity within the periventricular and deep white matter both cerebral hemispheres most consistent with chronic small vessel  ischemic disease, also similar. Small vessel type changes present within the pons. No mass lesion, midline shift, or mass effect. No hydrocephalus. No extra-axial fluid collection. Craniocervical junction within normal limits. Visualized upper cervical spine unremarkable. Pituitary gland normal.  No acute abnormality about the orbits. Chronic opacification of the left sphenoid sinus present. Scattered mucosal thickening within the ethmoidal air cells. Paranasal sinuses are otherwise clear. Minimal trace opacity within the bilateral mastoid air cells. Inner ear structures grossly normal. Bone marrow signal intensity within normal limits. Lipoma at the right frontal scalp. No acute scalp soft tissue abnormality. MRA HEAD FINDINGS ANTERIOR CIRCULATION: Study degraded by motion artifact. Visualized distal cervical segments of the internal carotid arteries are patent with antegrade flow. Petrous segments within normal limits. Marked did ectasia of the cavernous segments bilaterally. Supraclinoid right ICA 8 mildly ectatic as well. Focal globular somewhat irregular aneurysm measuring 7 x 5 mm at the right ICA terminus near the takeoff of the  right posterior communicating artery. A1 segments tortuous but patent. Anterior communicating artery normal. Azygos ACA noted, patent at its proximal mid aspect. M1 segments patent without occlusion or high-grade stenosis. Right MCA bifurcation normal. Aneurysm at the left MCA bifurcation measures approximately 10 x 9 mm. Distal MCA branches not well evaluated on this exam, but grossly patent. A1 segments patent. POSTERIOR CIRCULATION: Vertebral arteries patent to the vertebrobasilar junction. Left vertebral artery slightly dominant. Posterior inferior cerebral arteries patent. Basilar artery tortuous but patent to its distal aspect. Superior cerebellar arteries patent bilaterally. Prominent right posterior communicating artery supplies the right PCA. Left PCA arises from the basilar  artery. There is a focal severe stenosis within the proximal left P2 segment. Left PCA is opacified to its distal aspect with multi focal atheromatous irregularity. Right PCA not well evaluated distally on this exam, but likely demonstrates multifocal atheromatous irregularity is well. IMPRESSION: MRI HEAD IMPRESSION: 1. No acute intracranial infarct or other abnormality identified. 2. Stable atrophy with chronic small vessel ischemic disease. 3. Chronic lacunar infarction within the right thalamus. 4. Chronic left sphenoid sinus disease. MRA HEAD IMPRESSION: 1. No large or proximal arterial branch occlusion within intracranial circulation. 2. Focal severe stenosis within the proximal left P2 segment. No other high-grade or correctable stenosis identified. 3. 10 x 9 mm aneurysm at the left MCA bifurcation. 4. 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 Marked ectasia of the cavernous ICAs bilaterally. Electronically Signed   By: Rise Mu M.D.   On: 05/28/2015 22:36   Ct Head Code Stroke W/o Cm  05/28/2015  CLINICAL DATA:  Left-sided weakness.  Code stroke. EXAM: CT HEAD WITHOUT CONTRAST TECHNIQUE: Contiguous axial images were obtained from the base of the skull through the vertex without intravenous contrast. COMPARISON:  01/27/2015 FINDINGS: Global atrophy. Chronic ischemic changes. There is no mass effect, midline shift, or acute intracranial hemorrhage. Dense atherosclerotic calcifications of the carotid siphons. There is dense mucous material in the left sphenoid sinus. Left mastoidectomy has been performed. Right mastoid air cells are clear. IMPRESSION: No acute intracranial pathology. Electronically Signed   By: Jolaine Click M.D.   On: 05/28/2015 14:30    Microbiology: Recent Results (from the past 240 hour(s))  Urine culture     Status: None   Collection Time: 05/28/15  4:06 PM  Result Value Ref Range Status   Specimen Description URINE, CLEAN CATCH  Final   Special Requests NONE   Final   Culture NO GROWTH 2 DAYS  Final   Report Status 05/30/2015 FINAL  Final  MRSA PCR Screening     Status: None   Collection Time: 05/29/15 11:33 AM  Result Value Ref Range Status   MRSA by PCR NEGATIVE NEGATIVE Final    Comment:        The GeneXpert MRSA Assay (FDA approved for NASAL specimens only), is one component of a comprehensive MRSA colonization surveillance program. It is not intended to diagnose MRSA infection nor to guide or monitor treatment for MRSA infections.      Labs: Basic Metabolic Panel:  Recent Labs Lab 05/28/15 1411 05/31/15 0817  NA 134* 137  K 3.7 3.2*  CL 96* 98*  CO2 25 27  GLUCOSE 114* 160*  BUN 15 15  CREATININE 0.59 0.68  CALCIUM 8.6* 8.8*  MG  --  2.0   Liver Function Tests:  Recent Labs Lab 05/28/15 1411 05/31/15 0817  AST 19 22  ALT 12* 11*  ALKPHOS 61 50  BILITOT 0.9 0.8  PROT 6.0* 5.8*  ALBUMIN 3.5 2.9*   No results for input(s): LIPASE, AMYLASE in the last 168 hours.  Recent Labs Lab 05/28/15 1411  AMMONIA 24   CBC:  Recent Labs Lab 05/28/15 1411 05/31/15 0817  WBC 8.1 7.5  NEUTROABS 5.7  --   HGB 13.0 12.0  HCT 39.4 36.6  MCV 91.6 90.8  PLT 190 206   Cardiac Enzymes: No results for input(s): CKTOTAL, CKMB, CKMBINDEX, TROPONINI in the last 168 hours. BNP: BNP (last 3 results) No results for input(s): BNP in the last 8760 hours.  ProBNP (last 3 results) No results for input(s): PROBNP in the last 8760 hours.  CBG:  Recent Labs Lab 05/30/15 0634  GLUCAP 94    Signed:  Cathrine Krizan K  [facility] 05/31/2015, 1:47 PM

## 2015-05-30 NOTE — NC FL2 (Addendum)
[place] MEDICAID FL2 LEVEL OF CARE SCREENING TOOL     IDENTIFICATION  Patient Name: [name] Birthdate: 03-17-1926 Sex: female Admission Date (Current Location): 05/28/2015  [place] and [place] Number:  Aaron Edelman [id number] R Facility and Address:   [facility]      Provider Number: [id number]  Attending Physician Name and Address:  Jerald Kief, MD  Relative Name and Phone Number:  Maxie Barb [phone] or Biggs,Patsy [phone]    Current Level of Care: Hospital Recommended Level of Care: Skilled Nursing Facility Prior Approval Number:    Date Approved/Denied:   PASRR Number: [id number]  Discharge Plan: SNF    Current Diagnoses: Patient Active Problem List   Diagnosis Date Noted  . Left arm weakness 05/29/2015  . TIA (transient ischemic attack) 05/28/2015  . NASH (nonalcoholic steatohepatitis) 05/28/2015  . OSA (obstructive sleep apnea) 05/28/2015  . Deaf (PARTIAL) 05/28/2015  . Acute urinary retention 05/28/2015  . Stage 1 decubitus ulcer/buttock 05/28/2015  . FTT (failure to thrive) in adult 05/28/2015  . Altered mental status   . Allergic rhinitis 10/11/2013  . Chest pain 08/04/2013  . Aneurysm, cerebral, nonruptured 07/31/2013  . Hypertensive encephalopathy 04/24/2011  . COPD (chronic obstructive pulmonary disease) (HCC) 04/21/2011  . HTN (hypertension) 04/21/2011    Orientation RESPIRATION BLADDER Height & Weight     Self, Situation, Place    Incontinent Weight: 118 lb 2.7 oz (53.6 kg) Height:     BEHAVIORAL SYMPTOMS/MOOD NEUROLOGICAL BOWEL NUTRITION STATUS      Continent Diet (Regular)  AMBULATORY STATUS COMMUNICATION OF NEEDS Skin   Limited Assist Verbally PU Stage and Appropriate Care PU Stage 1 Dressing: Daily                     Personal Care Assistance Level of Assistance  Bathing, Dressing Bathing Assistance: Limited assistance   Dressing Assistance: Limited assistance     Functional  Limitations Info  Hearing (Hard of Hearing)   Hearing Info: Impaired      SPECIAL CARE FACTORS FREQUENCY  PT (By licensed PT)     PT Frequency: 5 x a week              Contractures      Additional Factors Info  Code Status, Psychotropic   Allergies: ACETAMINOPHEN, PENICILLINS, VICODIN Code Status Info: DNR   Psychotropic Info: risperiDONE (RISPERDAL) tablet 0.5 mg          Current Medications (05/30/2015):  This is the current hospital active medication list Current Facility-Administered Medications  Medication Dose Route Frequency Provider Last Rate Last Dose  . 0.9 %  sodium chloride infusion   Intravenous Continuous Russella Dar, NP 50 mL/hr at 05/29/15 2151    . amLODipine (NORVASC) tablet 10 mg  10 mg Oral Daily Russella Dar, NP   10 mg at 05/30/15 1034  . aspirin chewable tablet 81 mg  81 mg Oral Daily Russella Dar, NP   81 mg at 05/30/15 1033  . calcium carbonate (TUMS - dosed in mg elemental calcium) chewable tablet 200 mg of elemental calcium  1 tablet Oral Q4H PRN Russella Dar, NP      . calcium-vitamin D (OSCAL WITH D) 500-200 MG-UNIT per tablet 1 tablet  1 tablet Oral BID Russella Dar, NP   1 tablet at 05/30/15 1033  . clopidogrel (PLAVIX) tablet 75 mg  75 mg Oral Daily Leatha Gilding, MD   75 mg at 05/30/15 1033  .  docusate sodium (COLACE) capsule 100 mg  100 mg Oral BID Russella Dar, NP   100 mg at 05/30/15 1034  . enoxaparin (LOVENOX) injection 40 mg  40 mg Subcutaneous Q24H Russella Dar, NP   40 mg at 05/29/15 2144  . feeding supplement (ENSURE ENLIVE) (ENSURE ENLIVE) liquid 237 mL  237 mL Oral BID BM Jerald Kief, MD   237 mL at 05/30/15 1000  . gabapentin (NEURONTIN) capsule 300 mg  300 mg Oral QHS Russella Dar, NP   300 mg at 05/29/15 2145  . ipratropium-albuterol (DUONEB) 0.5-2.5 (3) MG/3ML nebulizer solution 3 mL  3 mL Nebulization Q6H PRN Russella Dar, NP      . labetalol (NORMODYNE,TRANDATE) injection 5 mg  5 mg  Intravenous Q10 min PRN Jerald Kief, MD      . lisinopril (PRINIVIL,ZESTRIL) tablet 30 mg  30 mg Oral Daily Russella Dar, NP   30 mg at 05/30/15 1034  . loratadine (CLARITIN) tablet 10 mg  10 mg Oral Daily PRN Russella Dar, NP      . multivitamin with minerals tablet 1 tablet  1 tablet Oral Daily Russella Dar, NP   1 tablet at 05/30/15 1034  . pantoprazole (PROTONIX) EC tablet 20 mg  20 mg Oral Daily Russella Dar, NP   20 mg at 05/30/15 1034  . propranolol ER (INDERAL LA) SR capsule 160 mg  160 mg Oral Daily Russella Dar, NP   160 mg at 05/30/15 1034  . risperiDONE (RISPERDAL) tablet 0.5 mg  0.5 mg Oral Daily Russella Dar, NP   0.5 mg at 05/30/15 1034  . traMADol (ULTRAM) tablet 50 mg  50 mg Oral Q6H PRN Roma Kayser Schorr, NP      . zolpidem (AMBIEN) tablet 5 mg  5 mg Oral Once Leanne Chang, NP   5 mg at 05/29/15 0015     Discharge Medications: Please see discharge summary for a list of discharge medications.  Relevant Imaging Results:  Relevant Lab Results:   Additional Information SSN is [id number]  [place]

## 2015-05-30 NOTE — Clinical Social Work Note (Addendum)
Patient to be d/c'ed today to [facility].  Patient and family agreeable to plans will transport via ems RN to call report to [facility] at [phone].  2:30pm Patient not medically ready for discharge today per physician, DC cancelled.  Windell Moulding, MSW, Theresia Majors [phone]

## 2015-05-30 NOTE — Progress Notes (Signed)
Occupational Therapy Evaluation/Discharge Patient Details Name: [name] MRN: [id number] DOB: 1925-08-16 Today's Date: 05/30/2015    History of Present Illness Pt admitted from SNF with LUE weakness. MRI (-) for acute event. PMHx: sleep apnea, HTN, COPD, HOH, cleft palate   Clinical Impression   Patient evaluated by Occupational Therapy with no further acute OT needs identified. Pt completed transfers and ADLs with min guard assist and family reports she is functioning near her baseline, but is moving slower and is more shaking than normal. Pt plans to d/c back to her home at [facility] where she receives assistance for ADLs and mobility. All education has been completed and pt has no further questions. Pt appears to be functioning near her baseline and has no further acute OT needs. OT signing off. Thank you for this referral.    Follow Up Recommendations  SNF;Supervision/Assistance - 24 hour    Equipment Recommendations  None recommended by OT    Recommendations for Other Services       Precautions / Restrictions Precautions Precautions: Fall Restrictions Weight Bearing Restrictions: No      Mobility Bed Mobility               General bed mobility comments: Pt up in chair on OT arrival  Transfers Overall transfer level: Needs assistance Equipment used: Rolling walker (2 wheeled) Transfers: Sit to/from Stand Sit to Stand: Min guard         General transfer comment: Min guard assist for safety. Good demonstration for safe hand placement for all transfers.    Balance Overall balance assessment: Needs assistance Sitting-balance support: No upper extremity supported;Feet supported Sitting balance-Leahy Scale: Good     Standing balance support: Bilateral upper extremity supported;During functional activity Standing balance-Leahy Scale: Fair Standing balance comment: Able to maintain balance without UE to complete grooming tasks.                            ADL Overall ADL's : Needs assistance/impaired     Grooming: Min Production designer, theatre/television/film: Min guard;Ambulation;BSC;RW Toilet Transfer Details (indicate cue type and reason): BSC over toilet Toileting- Clothing Manipulation and Hygiene: Min guard;Sit to/from stand       Functional mobility during ADLs: Min guard;Rolling walker General ADL Comments: Pt's family reports she is close to baseline, but is moving slower and is more shaky than normal.     Vision Vision Assessment?: No apparent visual deficits   Perception     Praxis      Pertinent Vitals/Pain Pain Assessment: No/denies pain     Hand Dominance Right   Extremity/Trunk Assessment Upper Extremity Assessment Upper Extremity Assessment: Generalized weakness   Lower Extremity Assessment Lower Extremity Assessment: Generalized weakness   Cervical / Trunk Assessment Cervical / Trunk Assessment: Kyphotic   Communication Communication Communication: HOH;Expressive difficulties   Cognition Arousal/Alertness: Awake/alert Behavior During Therapy: WFL for tasks assessed/performed Overall Cognitive Status: Within Functional Limits for tasks assessed (Family reports she is functioning at cognitive baseline)                     General Comments       Exercises       Shoulder Instructions      Home Living Family/patient expects to be discharged to:: Skilled nursing facility [facility])  Prior Functioning/Environment Level of Independence: Needs assistance  Gait / Transfers Assistance Needed: Ambulates with rollator with supervision ADL's / Homemaking Assistance Needed: assist for bathing and dressing        OT Diagnosis: Generalized weakness   OT Problem List: Decreased strength;Decreased range of motion;Decreased activity tolerance;Impaired balance (sitting and/or standing);Decreased  coordination;Decreased safety awareness   OT Treatment/Interventions:      OT Goals(Current goals can be found in the care plan section) Acute Rehab OT Goals Patient Stated Goal: return home OT Goal Formulation: With patient Time For Goal Achievement: 06/13/15 Potential to Achieve Goals: Good  OT Frequency:     Barriers to D/C:            Co-evaluation              End of Session Equipment Utilized During Treatment: Gait belt;Rolling walker Nurse Communication: Mobility status  Activity Tolerance: Patient tolerated treatment well Patient left: in chair;with call bell/phone within reach;with chair alarm set;with family/visitor present   Time: 1202-1217 OT Time Calculation (min): 15 min Charges:  OT General Charges $OT Visit: 1 Procedure OT Evaluation $OT Eval Moderate Complexity: 1 Procedure G-Codes: OT G-codes **NOT FOR INPATIENT CLASS** Functional Assessment Tool Used: clinical judgement Functional Limitation: Self care Self Care Current Status (Z6109): At least 1 percent but less than 20 percent impaired, limited or restricted Self Care Goal Status (U0454): At least 1 percent but less than 20 percent impaired, limited or restricted Self Care Discharge Status [phone]): At least 1 percent but less than 20 percent impaired, limited or restricted  Nils Pyle, OTR/L Pager: [phone] 05/30/2015, 1:10 PM

## 2015-05-30 NOTE — Care Management Note (Signed)
Case Management Note  Patient Details  Name: ALIZAH SILLS MRN: [id number] Date of Birth: 06/09/1925  Subjective/Objective:          Admitted with left arm weakness          Action/Plan: Patient from [facility]. PT recommended SNF. Per CSW, patient to discharge back to [facility] today.     Expected Discharge Date:                  Expected Discharge Plan:  Skilled Nursing Facility  In-House Referral:  Clinical Social Work  Discharge planning Services  CM Consult  Post Acute Care Choice:  NA Choice offered to:  NA  DME Arranged:  N/A DME Agency:     HH Arranged:  NA HH Agency:     Status of Service:  Completed, signed off  Medicare Important Message Given:    Date Medicare IM Given:    Medicare IM give by:    Date Additional Medicare IM Given:    Additional Medicare Important Message give by:     If discussed at Long Length of Stay Meetings, dates discussed:    Additional Comments:  Monica Becton, RN 05/30/2015, 12:35 PM

## 2015-05-30 NOTE — Treatment Plan (Signed)
BP noted to be borderline low this afternoon, seemed to coincide with multiple bp meds given this AM. BP improved towards end of day, however remains soft. During this time, patient has remained asymptomatic. Given change in BP, recommended holding d/c and continue close observation overnight. Amlodipine held for now

## 2015-05-30 NOTE — Clinical Social Work Note (Signed)
Clinical Social Work Assessment  Patient Details  Name: [name] MRN: [id number] Date of Birth: 04/22/1925  Date of referral:  05/29/15               Reason for consult:  Facility Placement                Permission sought to share information with:  Facility Medical sales representative, Family Supports Permission granted to share information::  Yes, Verbal Permission Granted  Name::     Maxie Barb [phone] or Biggs,Patsy [phone]  Agency::  SNF admissions  Relationship::     Contact Information:     Housing/Transportation Living arrangements for the past 2 months:  Skilled Nursing Facility Source of Information:  Patient, Adult Children Patient Interpreter Needed:  None Criminal Activity/Legal Involvement Pertinent to Current Situation/Hospitalization:  No - Comment as needed Significant Relationships:  Adult Children Lives with:  Facility Resident Do you feel safe going back to the place where you live?  Yes Need for family participation in patient care:  Yes (Comment)  Care giving concerns:  Patient and family do not have any concerns or issues with care being provided at [facility]  Social Worker assessment / plan:  Patient is a 82 year old female who lives at [facility].  Patient is alert and oriented x3, and family was at bedside.  Patient and her family did not express any issues or concerns with returning back to SNF.  Patient and family were explained that patient will be discharged back to facility and will get some therapy.  Patient's family expressed they are glad she is returning back today.  Patient and family did not have any questions.  Employment status:  Retired Database administrator PT Recommendations:  Skilled Nursing Facility Information / Referral to community resources:     Patient/Family's Response to care:  Patient and family agreeable to returning back to SNF.  Patient/Family's Understanding  of and Emotional Response to Diagnosis, Current Treatment, and Prognosis:  Patient and family aware of current diagnosis and treatment plan.  Emotional Assessment Appearance:  Appears stated age Attitude/Demeanor/Rapport:    Affect (typically observed):  Appropriate, Stable, Calm, Pleasant Orientation:  Oriented to Place, Oriented to Self, Oriented to Situation Alcohol / Substance use:  Not Applicable Psych involvement (Current and /or in the community):  No (Comment)  Discharge Needs  Concerns to be addressed:  No discharge needs identified Readmission within the last 30 days:  No Current discharge risk:  None Barriers to Discharge:  No Barriers Identified   Darleene Cleaver, LCSWA 05/30/2015, 10:55 AM

## 2015-05-31 DIAGNOSIS — I671 Cerebral aneurysm, nonruptured: Secondary | ICD-10-CM | POA: Diagnosis not present

## 2015-05-31 DIAGNOSIS — R531 Weakness: Secondary | ICD-10-CM | POA: Diagnosis not present

## 2015-05-31 DIAGNOSIS — I10 Essential (primary) hypertension: Secondary | ICD-10-CM | POA: Diagnosis not present

## 2015-05-31 DIAGNOSIS — L8991 Pressure ulcer of unspecified site, stage 1: Secondary | ICD-10-CM

## 2015-05-31 DIAGNOSIS — R29898 Other symptoms and signs involving the musculoskeletal system: Secondary | ICD-10-CM | POA: Diagnosis not present

## 2015-05-31 LAB — COMPREHENSIVE METABOLIC PANEL
ALBUMIN: 2.9 g/dL — AB (ref 3.5–5.0)
ALT: 11 U/L — ABNORMAL LOW (ref 14–54)
ANION GAP: 12 (ref 5–15)
AST: 22 U/L (ref 15–41)
Alkaline Phosphatase: 50 U/L (ref 38–126)
BUN: 15 mg/dL (ref 6–20)
CO2: 27 mmol/L (ref 22–32)
Calcium: 8.8 mg/dL — ABNORMAL LOW (ref 8.9–10.3)
Chloride: 98 mmol/L — ABNORMAL LOW (ref 101–111)
Creatinine, Ser: 0.68 mg/dL (ref 0.44–1.00)
GFR calc Af Amer: >60 mL/min (ref >60)
GFR calc non Af Amer: >60 mL/min (ref >60)
GLUCOSE: 160 mg/dL — AB (ref 65–99)
POTASSIUM: 3.2 mmol/L — AB (ref 3.5–5.1)
SODIUM: 137 mmol/L (ref 135–145)
Total Bilirubin: 0.8 mg/dL (ref 0.3–1.2)
Total Protein: 5.8 g/dL — ABNORMAL LOW (ref 6.5–8.1)

## 2015-05-31 LAB — CBC
HCT: 36.6 % (ref 36.0–46.0)
HEMOGLOBIN: 12 g/dL (ref 12.0–15.0)
MCH: 29.8 pg (ref 26.0–34.0)
MCHC: 32.8 g/dL (ref 30.0–36.0)
MCV: 90.8 fL (ref 78.0–100.0)
Platelets: 206 10*3/uL (ref 150–400)
RBC: 4.03 MIL/uL (ref 3.87–5.11)
RDW: 13.6 % (ref 11.5–15.5)
WBC: 7.5 10*3/uL (ref 4.0–10.5)

## 2015-05-31 LAB — MAGNESIUM: Magnesium: 2 mg/dL (ref 1.7–2.4)

## 2015-05-31 MED ORDER — PROPRANOLOL HCL ER 160 MG PO CP24
160.0000 mg | ORAL_CAPSULE | Freq: Every day | ORAL | Status: DC
  Administered 2015-05-31: 160 mg via ORAL
  Filled 2015-05-31: qty 1

## 2015-05-31 MED ORDER — PROPRANOLOL HCL ER 120 MG PO CP24
120.0000 mg | ORAL_CAPSULE | Freq: Every day | ORAL | Status: DC
  Filled 2015-05-31: qty 1

## 2015-05-31 MED ORDER — LISINOPRIL 20 MG PO TABS: 20.0000 mg | ORAL_TABLET | Freq: Every day | ORAL | Status: AC

## 2015-05-31 MED ORDER — PROPRANOLOL HCL ER 160 MG PO CP24: 160.0000 mg | ORAL_CAPSULE | Freq: Every day | ORAL | Status: DC

## 2015-05-31 MED ORDER — POTASSIUM CHLORIDE CRYS ER 20 MEQ PO TBCR
40.0000 meq | EXTENDED_RELEASE_TABLET | Freq: Two times a day (BID) | ORAL | Status: DC
  Administered 2015-05-31: 40 meq via ORAL
  Filled 2015-05-31: qty 2

## 2015-05-31 MED ORDER — POLYETHYLENE GLYCOL 3350 17 G PO PACK
17.0000 g | PACK | Freq: Every day | ORAL | Status: DC
  Administered 2015-05-31: 17 g via ORAL
  Filled 2015-05-31: qty 1

## 2015-05-31 MED ORDER — LISINOPRIL 20 MG PO TABS
20.0000 mg | ORAL_TABLET | Freq: Every day | ORAL | Status: DC
  Administered 2015-05-31: 20 mg via ORAL
  Filled 2015-05-31: qty 1

## 2015-05-31 NOTE — Progress Notes (Signed)
Patient ID: [name], female   DOB: [birth year], 82 y.o.   MRN: [id number]   Dr Rhona Leavens has placed a request for consultation for unruptured cerebral aneurysm  IMPRESSION: MRI HEAD IMPRESSION:  1. No acute intracranial infarct or other abnormality identified. 2. Stable atrophy with chronic small vessel ischemic disease. 3. Chronic lacunar infarction within the right thalamus. 4. Chronic left sphenoid sinus disease.  MRA HEAD IMPRESSION:  1. No large or proximal arterial branch occlusion within intracranial circulation. 2. Focal severe stenosis within the proximal left P2 segment. No other high-grade or correctable stenosis identified. 3. 10 x 9 mm aneurysm at the left MCA bifurcation. 4. 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 Marked ectasia of the cavernous ICAs bilaterally.  Asymptomatic  Discussed with Dr Rhona Leavens  Dr Corliss Skains out of office this week Will plan for OP consultation next week Pt will hear from scheduler with time and date  All agreeable

## 2015-05-31 NOTE — Progress Notes (Addendum)
[facility] PROGRESS NOTE  [name] WUJ:[id number] DOB: 1926-02-24 DOA: 05/28/2015 PCP: Terald Sleeper, MD  HPI/Brief narrative 82 year old female with hypertension, sleep apnea, was brought from the nursing home for weakness, confusion, and left arm numbness and decreased coordination on the left arm. Patient was admitted for neuro work up.  Assessment/Plan: L arm weakness -Symptoms prior to arrival were initially concerning for TIA with focal weakness and numbness now resolved -Neurology consulted with recommendations that presenting symptoms likely secondary to polypharmacy with no further neuro work up recommended -EEG normal -Suspect overmedication/undesired side effects from medications contributing to current symptomatology-as precaution, decreased bedtime Neurontin from  to 300 mg and have placed pt's Xanax on hold while admitted. -CT head negative -MRI/MRA brain neg for CVA - notable for known old/prior CVA -PT/OT/SLP evaluation -on Aspirin-was on Plavix prior to admission -Family reports that 4-5 months ago patient had some mental status changes and imaging was obtained (outpatient CT?). 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   HTN  -Currently controlled -Patient on multiple medications for blood pressure -Continued lisinopril and Inderal -amlodipine stopped secondary to borderline low BP and lisinopril decreased from  to  for same   Acute urinary retention -I/O catheterization was performed by RN with large-volume clear yellow urine returned -Checked urinalysis neg for UTI -Suspect high-dose Neurontin contributing (neurogenic bladder) as well as apparent frequent use of Xanax prn  Adult failure to thrive -Patient recently lost her husband and 2 other family members as well in the past few months -Patient has lost weight and appears clinically dehydrated according to family has not been eating -Continue IV  fluids -Nutrition consultation -Patient has a history of recurrent delirium and episodic paranoia may have underlying psychiatric disorder-continue Risperdal   COPD  -Currently compensated without wheezing   NASH  -Ammonia level 34 and LFTs not elevated   Stage 1 decubitus ulcer/buttock -Wound care consultation -Mattress replacement   Aneurysm optic -No acute visual changes -Family already aware of hx of chronic aneurysms involving MCA and ICA regions, although MCA aneurysm may appear slightly larger compared to 2013 study - discussed case with Neurology today who recommends IR consultation - I discussed the case with Interventional Radiology who states patient may be discharged from their standpoint and be seen as outpatient. Follow up placed in AVS for one week   OSA (obstructive sleep apnea) -Noncompliant with CPAP   Deaf (PARTIAL) -Able to verbally communicate although speech thick and sometimes difficult to understand  Possible afib - Noted during this admission. Telemetry strip reviewed. Questionable brief episode of afib, however mostly sinus tach on the early morning of 2/20. - Per PT, patient's standing balance is rated as "Poor," thus there are concerns of fall risk and resulting traumatic/devastating head bleed - consequently, recommend continued Aspirin for stroke prevention for now - Discussed the above with family in the room who is in agreement  Code Status: DNR Family Communication: Pt in room, daughter at bedside Disposition Plan: Anticipate return to SNF today if BP stable   Consultants:  Neurology  Procedures:    Antibiotics: Anti-infectives    None      HPI/Subjective: No complaints. Eager to return to SNF  Objective: Filed Vitals:   05/30/15 1958 05/31/15 0003 05/31/15 0612 05/31/15 0952  BP: 109/74 99/70 106/64 147/65  Pulse: 81 73 70 83  Temp: 97.7 F (36.5 C) 98.2 F (36.8 C) 98.3 F (36.8 C) 97.8 F (36.6 C)  TempSrc: Oral  Oral  Resp: Weight:      SpO2: 97% 97% 97% 100%    Intake/Output Summary (Last 24 hours) at 05/31/15 1027 Last data filed at 05/31/15 0900  Gross per 24 hour  Intake    720 ml  Output      0 ml  Net    720 ml   Filed Weights   05/28/15 1400 05/29/15 0428  Weight: 57.1 kg (125 lb 14.1 oz) 53.6 kg (118 lb 2.7 oz)    Exam:   General:  Awake, in nad  Cardiovascular: regular, s1, s2  Respiratory: normal resp effort, no wheezing  Abdomen: soft, nondistended, pos BS  Musculoskeletal: perfused, no clubbing   Data Reviewed: Basic Metabolic Panel:  Recent Labs Lab 05/28/15 1411 05/31/15 0817  NA 134* 137  K 3.7 3.2*  CL 96* 98*  CO2 25 27  GLUCOSE 114* 160*  BUN 15 15  CREATININE 0.59 0.68  CALCIUM 8.6* 8.8*  MG  --  2.0   Liver Function Tests:  Recent Labs Lab 05/28/15 1411 05/31/15 0817  AST 19 22  ALT 12* 11*  ALKPHOS 61 50  BILITOT 0.9 0.8  PROT 6.0* 5.8*  ALBUMIN 3.5 2.9*   No results for input(s): LIPASE, AMYLASE in the last 168 hours.  Recent Labs Lab 05/28/15 1411  AMMONIA 24   CBC:  Recent Labs Lab 05/28/15 1411 05/31/15 0817  WBC 8.1 7.5  NEUTROABS 5.7  --   HGB 13.0 12.0  HCT 39.4 36.6  MCV 91.6 90.8  PLT 190 206   Cardiac Enzymes: No results for input(s): CKTOTAL, CKMB, CKMBINDEX, TROPONINI in the last 168 hours. BNP (last 3 results) No results for input(s): BNP in the last 8760 hours.  ProBNP (last 3 results) No results for input(s): PROBNP in the last 8760 hours.  CBG:  Recent Labs Lab 05/30/15 0634  GLUCAP 94    Recent Results (from the past 240 hour(s))  Urine culture     Status: None   Collection Time: 05/28/15  4:06 PM  Result Value Ref Range Status   Specimen Description URINE, CLEAN CATCH  Final   Special Requests NONE  Final   Culture NO GROWTH 2 DAYS  Final   Report Status 05/30/2015 FINAL  Final  MRSA PCR Screening     Status: None   Collection Time: 05/29/15 11:33 AM  Result Value Ref  Range Status   MRSA by PCR NEGATIVE NEGATIVE Final    Comment:        The GeneXpert MRSA Assay (FDA approved for NASAL specimens only), is one component of a comprehensive MRSA colonization surveillance program. It is not intended to diagnose MRSA infection nor to guide or monitor treatment for MRSA infections.      Studies: No results found.  Scheduled Meds: . aspirin  81 mg Oral Daily  . calcium-vitamin D  1 tablet Oral BID  . clopidogrel  75 mg Oral Daily  . docusate sodium  100 mg Oral BID  . enoxaparin (LOVENOX) injection  40 mg Subcutaneous Q24H  . feeding supplement (ENSURE ENLIVE)  237 mL Oral BID BM  . gabapentin  300 mg Oral QHS  . lisinopril  20 mg Oral Daily  . multivitamin with minerals  1 tablet Oral Daily  . pantoprazole  20 mg Oral Daily  . potassium chloride  40 mEq Oral BID  . [START ON 06/01/2015] propranolol ER  160 mg Oral Daily  . risperiDONE  0.5 mg Oral Daily  . zolpidem  5 mg Oral Once   Continuous Infusions: . sodium chloride 50 mL/hr at 05/29/15 2151    Principal Problem:   Left arm weakness Active Problems:   COPD (chronic obstructive pulmonary disease) (HCC)   HTN (hypertension)   Aneurysm, cerebral, nonruptured   NASH (nonalcoholic steatohepatitis)   OSA (obstructive sleep apnea)   Deaf (PARTIAL)   Acute urinary retention   Stage 1 decubitus ulcer/buttock   FTT (failure to thrive) in adult  CHIU, STEPHEN K  [facility] Pager [phone]. If 7PM-7AM, please contact night-coverage at [email or web address], password [facility] 05/31/2015, 10:27 AM

## 2015-05-31 NOTE — Clinical Social Work Note (Signed)
Patient to be d/c'ed today to [facility]. Patient and family agreeable to plans will transport via ems RN to call report to [facility] at [phone].  Windell Moulding, MSW, Theresia Majors [phone]

## 2015-05-31 NOTE — Progress Notes (Signed)
Report called to [facility] spoke to [place]

## 2015-05-31 NOTE — Progress Notes (Signed)
Physical Therapy Treatment Patient Details Name: MILIANA GANGWER MRN: [id number] DOB: 1925-04-27 Today's Date: 05/31/2015    History of Present Illness Pt admitted from SNF with LUE weakness. MRI (-) for acute event. PMHx: sleep apnea, HTN, COPD, HOH, cleft palate    PT Comments    Patient demonstrated improved mobility this session. Current plan remains appropriate.   Follow Up Recommendations  Supervision/Assistance - 24 hour;SNF     Equipment Recommendations  None recommended by PT    Recommendations for Other Services       Precautions / Restrictions Precautions Precautions: Fall Restrictions Weight Bearing Restrictions: No    Mobility  Bed Mobility               General bed mobility comments: Pt up in chair upon arrival  Transfers Overall transfer level: Needs assistance Equipment used: Rolling walker (2 wheeled) Transfers: Sit to/from Stand Sit to Stand: Min guard         General transfer comment: min guard for safety; safe hand placement   Ambulation/Gait Ambulation/Gait assistance: Min guard Ambulation Distance (Feet): 85 Feet Assistive device: Rolling walker (2 wheeled) Gait Pattern/deviations: Step-through pattern;Decreased stride length;Narrow base of support;Trunk flexed   Gait velocity interpretation: Below normal speed for age/gender General Gait Details: cues for posture and position of RW   Stairs            Wheelchair Mobility    Modified Rankin (Stroke Patients Only)       Balance   Sitting-balance support: Feet supported;No upper extremity supported Sitting balance-Leahy Scale: Good     Standing balance support: Bilateral upper extremity supported Standing balance-Leahy Scale: Fair                      Cognition Arousal/Alertness: Awake/alert Behavior During Therapy: WFL for tasks assessed/performed Overall Cognitive Status: Within Functional Limits for tasks assessed (Family reports she is functioning  at cognitive baseline)                      Exercises General Exercises - Lower Extremity Long Arc Quad: AROM;Both;10 reps;Seated Hip ABduction/ADduction: AROM;Both;10 reps;Seated Hip Flexion/Marching: AROM;Both;10 reps;Seated Toe Raises: AROM;Both;15 reps;Seated Heel Raises: AROM;Both;15 reps;Seated    General Comments General comments (skin integrity, edema, etc.): per daughter pt hears better L side vs R       Pertinent Vitals/Pain Pain Assessment: No/denies pain    Home Living                      Prior Function            PT Goals (current goals can now be found in the care plan section) Acute Rehab PT Goals Patient Stated Goal: return home Progress towards PT goals: Progressing toward goals    Frequency  Min 3X/week    PT Plan Current plan remains appropriate    Co-evaluation             End of Session Equipment Utilized During Treatment: Gait belt Activity Tolerance: Patient tolerated treatment well Patient left: in chair;with call bell/phone within reach;with chair alarm set;with family/visitor present     Time: 4098-1191 PT Time Calculation (min) (ACUTE ONLY): 19 min  Charges:  $Gait Training: 8-22 mins                    G Codes:      Derek Mound, PTA Pager: [phone]  05/31/2015, 4:33 PM

## 2015-06-15 ENCOUNTER — Ambulatory Visit (HOSPITAL_COMMUNITY)
Admission: RE | Admit: 2015-06-15 | Discharge: 2015-06-15 | Disposition: A | Discharge status: Home / Self Care | Payer: Medicare Other | Source: Ambulatory Visit | Attending: Radiology | Admitting: Radiology

## 2015-06-15 DIAGNOSIS — I671 Cerebral aneurysm, nonruptured: Secondary | ICD-10-CM

## 2016-10-22 ENCOUNTER — Encounter: Payer: Self-pay | Admitting: Neurology

## 2016-10-29 ENCOUNTER — Ambulatory Visit: Payer: Medicare Other | Admitting: Neurology

## 2016-10-30 ENCOUNTER — Ambulatory Visit: Payer: Medicare Other | Admitting: Neurology

## 2016-11-12 ENCOUNTER — Encounter (HOSPITAL_COMMUNITY): Payer: Self-pay

## 2016-11-12 ENCOUNTER — Emergency Department (HOSPITAL_COMMUNITY)
Admission: EM | Admit: 2016-11-12 | Discharge: 2016-11-12 | Disposition: A | Discharge status: Home / Self Care | Payer: Medicare Other | Attending: Emergency Medicine | Admitting: Emergency Medicine

## 2016-11-12 ENCOUNTER — Emergency Department (HOSPITAL_COMMUNITY): Payer: Medicare Other

## 2016-11-12 DIAGNOSIS — W19XXXD Unspecified fall, subsequent encounter: Secondary | ICD-10-CM | POA: Insufficient documentation

## 2016-11-12 DIAGNOSIS — Z7902 Long term (current) use of antithrombotics/antiplatelets: Secondary | ICD-10-CM | POA: Insufficient documentation

## 2016-11-12 DIAGNOSIS — I25119 Atherosclerotic heart disease of native coronary artery with unspecified angina pectoris: Secondary | ICD-10-CM | POA: Diagnosis not present

## 2016-11-12 DIAGNOSIS — R4182 Altered mental status, unspecified: Secondary | ICD-10-CM | POA: Diagnosis present

## 2016-11-12 DIAGNOSIS — Y92129 Unspecified place in nursing home as the place of occurrence of the external cause: Secondary | ICD-10-CM | POA: Insufficient documentation

## 2016-11-12 DIAGNOSIS — Z79899 Other long term (current) drug therapy: Secondary | ICD-10-CM | POA: Insufficient documentation

## 2016-11-12 DIAGNOSIS — T50905A Adverse effect of unspecified drugs, medicaments and biological substances, initial encounter: Secondary | ICD-10-CM | POA: Diagnosis not present

## 2016-11-12 DIAGNOSIS — Y69 Unspecified misadventure during surgical and medical care: Secondary | ICD-10-CM | POA: Insufficient documentation

## 2016-11-12 DIAGNOSIS — T887XXA Unspecified adverse effect of drug or medicament, initial encounter: Secondary | ICD-10-CM | POA: Insufficient documentation

## 2016-11-12 DIAGNOSIS — N189 Chronic kidney disease, unspecified: Secondary | ICD-10-CM | POA: Insufficient documentation

## 2016-11-12 DIAGNOSIS — I129 Hypertensive chronic kidney disease with stage 1 through stage 4 chronic kidney disease, or unspecified chronic kidney disease: Secondary | ICD-10-CM | POA: Diagnosis not present

## 2016-11-12 DIAGNOSIS — G2571 Drug induced akathisia: Secondary | ICD-10-CM | POA: Diagnosis not present

## 2016-11-12 DIAGNOSIS — J449 Chronic obstructive pulmonary disease, unspecified: Secondary | ICD-10-CM | POA: Insufficient documentation

## 2016-11-12 HISTORY — DX: Unspecified mood (affective) disorder: F39

## 2016-11-12 HISTORY — DX: Cerebral infarction, unspecified: I63.9

## 2016-11-12 HISTORY — DX: Peripheral vascular disease, unspecified: I73.9

## 2016-11-12 HISTORY — DX: Auditory hallucinations: R44.0

## 2016-11-12 HISTORY — DX: Anxiety disorder, unspecified: F41.9

## 2016-11-12 HISTORY — DX: Other disorders of optic nerve, not elsewhere classified, unspecified eye: H47.099

## 2016-11-12 HISTORY — DX: Insomnia, unspecified: G47.00

## 2016-11-12 HISTORY — DX: Epistaxis: R04.0

## 2016-11-12 HISTORY — DX: Disorder of kidney and ureter, unspecified: N28.9

## 2016-11-12 HISTORY — DX: Paranoid personality disorder: F60.0

## 2016-11-12 HISTORY — DX: Dysphagia, unspecified: R13.10

## 2016-11-12 HISTORY — DX: Sedative, hypnotic or anxiolytic dependence, uncomplicated: F13.20

## 2016-11-12 HISTORY — DX: Nonalcoholic steatohepatitis (NASH): K75.81

## 2016-11-12 HISTORY — DX: Atherosclerotic heart disease of native coronary artery with unspecified angina pectoris: I25.119

## 2016-11-12 LAB — CBC WITH DIFFERENTIAL/PLATELET
BASOS PCT: 0 %
Basophils Absolute: 0 10*3/uL (ref 0.0–0.1)
Eosinophils Absolute: 0 10*3/uL (ref 0.0–0.7)
Eosinophils Relative: 0 %
HEMATOCRIT: 40.2 % (ref 36.0–46.0)
HEMOGLOBIN: 12.9 g/dL (ref 12.0–15.0)
Lymphocytes Relative: 16 %
Lymphs Abs: 1.3 10*3/uL (ref 0.7–4.0)
MCH: 28.2 pg (ref 26.0–34.0)
MCHC: 32.1 g/dL (ref 30.0–36.0)
MCV: 87.8 fL (ref 78.0–100.0)
Monocytes Absolute: 0.6 10*3/uL (ref 0.1–1.0)
Monocytes Relative: 7 %
NEUTROS ABS: 6.5 10*3/uL (ref 1.7–7.7)
NEUTROS PCT: 77 %
Platelets: 270 10*3/uL (ref 150–400)
RBC: 4.58 MIL/uL (ref 3.87–5.11)
RDW: 14.2 % (ref 11.5–15.5)
WBC: 8.5 10*3/uL (ref 4.0–10.5)

## 2016-11-12 LAB — COMPREHENSIVE METABOLIC PANEL
ALT: 19 U/L (ref 14–54)
AST: 26 U/L (ref 15–41)
Albumin: 4 g/dL (ref 3.5–5.0)
Alkaline Phosphatase: 91 U/L (ref 38–126)
Anion gap: 12 (ref 5–15)
BUN: 24 mg/dL — ABNORMAL HIGH (ref 6–20)
CO2: 31 mmol/L (ref 22–32)
Calcium: 9.4 mg/dL (ref 8.9–10.3)
Chloride: 94 mmol/L — ABNORMAL LOW (ref 101–111)
Creatinine, Ser: 0.88 mg/dL (ref 0.44–1.00)
GFR calc non Af Amer: 57 mL/min — ABNORMAL LOW (ref >60)
Glucose, Bld: 100 mg/dL — ABNORMAL HIGH (ref 65–99)
Potassium: 3.9 mmol/L (ref 3.5–5.1)
SODIUM: 137 mmol/L (ref 135–145)
TOTAL PROTEIN: 7.7 g/dL (ref 6.5–8.1)
Total Bilirubin: 1.3 mg/dL — ABNORMAL HIGH (ref 0.3–1.2)

## 2016-11-12 LAB — URINALYSIS, ROUTINE W REFLEX MICROSCOPIC
BILIRUBIN URINE: NEGATIVE
Glucose, UA: NEGATIVE mg/dL
HGB URINE DIPSTICK: NEGATIVE
KETONES UR: NEGATIVE mg/dL
Nitrite: POSITIVE — AB
Protein, ur: NEGATIVE mg/dL
Specific Gravity, Urine: 1.011 (ref 1.005–1.030)
pH: 7 (ref 5.0–8.0)

## 2016-11-12 LAB — AMMONIA

## 2016-11-12 LAB — TROPONIN I: Troponin I: <0.03 ng/mL (ref <0.03)

## 2016-11-12 LAB — PROTIME-INR
INR: 1.04
Prothrombin Time: 13.6 seconds (ref 11.4–15.2)

## 2016-11-12 LAB — BRAIN NATRIURETIC PEPTIDE: B Natriuretic Peptide: 123 pg/mL — ABNORMAL HIGH (ref 0.0–100.0)

## 2016-11-12 MED ORDER — SULFAMETHOXAZOLE-TRIMETHOPRIM 800-160 MG PO TABS: 1.0000 | ORAL_TABLET | Freq: Two times a day (BID) | ORAL | 0 refills | Status: AC

## 2016-11-12 MED ORDER — RISPERIDONE 0.25 MG PO TABS: 0.2500 mg | ORAL_TABLET | Freq: Two times a day (BID) | ORAL | 0 refills | Status: AC

## 2016-11-12 MED ORDER — SODIUM CHLORIDE 0.9 % IV BOLUS (SEPSIS)
500.0000 mL | Freq: Once | INTRAVENOUS | Status: AC
  Administered 2016-11-12: 500 mL via INTRAVENOUS

## 2016-11-12 MED ORDER — BENZTROPINE MESYLATE 1 MG PO TABS: 1.0000 mg | ORAL_TABLET | Freq: Two times a day (BID) | ORAL | 0 refills | Status: AC

## 2016-11-12 MED ORDER — BENZTROPINE MESYLATE 1 MG/ML IJ SOLN
1.0000 mg | Freq: Once | INTRAMUSCULAR | Status: AC
  Administered 2016-11-12: 1 mg via INTRAMUSCULAR
  Filled 2016-11-12: qty 1
  Filled 2016-11-12: qty 2

## 2016-11-12 NOTE — ED Notes (Signed)
[facility] could not get patient's nurse to take report, patient will return via ambulance.

## 2016-11-12 NOTE — Discharge Instructions (Signed)
The symptoms that you're having tonight are because of the medication that you take to prevent your hallucinations. It is causing your restlessness, you're dry mouth, you're twitching. Your testing has shown a possible urinary infection but the causes of her symptoms are from the medications. Please take the following medications starting tomorrow morning  Risperdal, 0.25 mg by mouth twice a day every 12 hours  Cogentin, 1 mg by mouth every 12 hours.  You should follow-up with your doctor tomorrow to let them know about these medication changes, return to the emergency department for severe or worsening symptoms.
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
## 2016-11-12 NOTE — ED Triage Notes (Signed)
[facility] 3 and hit head.  Family requested pt be evaluated because she has "not been acting like herself" since the fall.  Staff said pt has been more confused than usual.  Pt presently alert and oriented to place and self.   Denies any pain.

## 2016-11-15 LAB — URINE CULTURE: Culture: >=100000 — AB

## 2016-11-16 ENCOUNTER — Telehealth: Payer: Self-pay

## 2016-11-16 NOTE — Telephone Encounter (Signed)
Post ED Visit - Positive Culture Follow-up  Culture report reviewed by antimicrobial stewardship pharmacist:  []  Enzo BiNathan Batchelder, Pharm.D. []  Celedonio MiyamotoJeremy Frens, Pharm.D., BCPS AQ-ID []  Garvin FilaMike Maccia, Pharm.D., BCPS []  Georgina PillionElizabeth Martin, [address]Pharm[address]D., BCPS []  DolliverMinh Pham, Vermont[place]arm.D., BCPS, AAHIVP []  Estella HuskMichelle Turner, Pharm.D., BCPS, AAHIVP []  Lysle Pearlachel Rumbarger, PharmD, BCPS []  Casilda Carlsaylor Stone, PharmD, BCPS []  Pollyann SamplesAndy Johnston, PharmD, BCPS Hailey Francoise SchaumannBaird Pharm D Positive urine culture Treated with Sulfamethoxazole, organism sensitive to the same and no further patient follow-up is required at this time.  Jerry CarasCullom, Saadiya Wilfong Burnett 11/16/2016, 12:38 PM

## 2017-01-07 ENCOUNTER — Ambulatory Visit (INDEPENDENT_AMBULATORY_CARE_PROVIDER_SITE_OTHER): Payer: Medicare Other | Admitting: Neurology

## 2017-01-07 ENCOUNTER — Encounter: Payer: Self-pay | Admitting: Neurology

## 2017-01-07 VITALS — BP 120/80 | HR 80 | Wt 108.0 lb

## 2017-01-07 DIAGNOSIS — I671 Cerebral aneurysm, nonruptured: Secondary | ICD-10-CM | POA: Diagnosis not present

## 2017-01-07 DIAGNOSIS — F0151 Vascular dementia with behavioral disturbance: Secondary | ICD-10-CM

## 2017-01-07 DIAGNOSIS — F01518 Vascular dementia, unspecified severity, with other behavioral disturbance: Secondary | ICD-10-CM

## 2017-01-07 NOTE — Patient Instructions (Signed)
1.  We will get MRA of head to evaluate aneurysms 2.  We will refer back to Dr. Corliss Skains (scheduled after MRA)

## 2017-01-07 NOTE — Progress Notes (Signed)
Note sent to Nurse Practitioner.

## 2017-01-07 NOTE — Progress Notes (Signed)
NEUROLOGY CONSULTATION NOTE  BAKER KOGLER MRN: [id number] DOB: [birth year]  Referring provider: Wilkie Aye, RN Primary care provider: no PCP  Reason for consult:  Cerebral aneurysm  HISTORY OF PRESENT ILLNESS: [name] is an 83 year old female with vascular dementia, peripheral vascular disease, hypertension, COPD, anxiety disorder, CKD and history of TIA who presents for evaluation of aneurysm.  She is accompanied by her daughter and granddaughter who supplements history.  Ms. Roanhorse resides at [facility] and [facility].  She has history of vascular dementia.  She has associated auditory hallucinations with delusions (she hears men talking to her and thinks they are bothering her or wanting to take her away and rape her) and anxiety disorder.  She has been to the hospital for encephalopathy secondary to polypharmacy.  Most recent head imaging was a CT from   She has known history of unruptured cereberal aneurysm.  MRI and MRA of head from 04/21/11 was personally reviewed and demonstrated aneurysm at the left MCA bifurcation measuring 7 x 8 x 5 mm, as well as dolichoectasia/fusiform  aneurysmal enlargement of both ICA siphons up to 8 mm.  Repeat MRI and MRA of the brain performed on 05/28/15, which was personally reviewed and demonstrated 10 x 9 mm aneurysm at the left MCA bifurcation and 7 x 5 mm aneurysm at the right ICA terminus.  She was evaluated by Dr. Julieanne Cotton of interventional radiology on 06/15/15.  At that time, given patient's comorbidities, conservative management was recommended.    PAST MEDICAL HISTORY: Past Medical History:  Diagnosis Date  . Anxiety   . Atherosclerotic heart disease native coronary artery w/angina pectoris (HCC)   . Auditory hallucinations   . Cerebral aneurysm 07/2011   "non ruptured; left side; behind optical nerve"  . Chronic back pain   . Cirrhosis (HCC)   . Complication of anesthesia    "don't like medicine or to  be put to sleep"  . Deaf, left   . DVT (deep venous thrombosis), right 1970's   "not long after knee OR"  . Dysphagia   . Emphysema   . Epistaxis   . Exertional dyspnea   . GERD (gastroesophageal reflux disease)   . H/O hiatal hernia   . Headache(784.0)   . Hepatitis   . History of shingles 05/2010  . HOH (hard of hearing), right   . Hypertension   . Hypertensive encephalopathy   . Insomnia   . Migraines   . Mood disorder (HCC)   . Muscle weakness   . NASH (nonalcoholic steatohepatitis)   . Obstructive chronic bronchitis   . Optic nerve disorder   . Osteoarthritis of lumbar spine   . Pacemaker    'as a child"  . Paranoid personality (disorder) (HCC)   . Peripheral vascular disease (HCC)   . Renal disorder   . Scoliosis   . Sedative hypnotic or anxiolytic dependence (HCC)   . Shortness of breath 08/15/11   "& when lying down"  . Sleep apnea    "doesn't wear mask"  . Steatohepatitis, non-alcoholic   . Stroke [facility])    TIA    PAST SURGICAL HISTORY: Past Surgical History:  Procedure Laterality Date  . DILATION AND CURETTAGE OF UTERUS  1970's  . KNEE ARTHROSCOPY  ?1970's   right  . MYRINGOTOMY  08/2011   right  . TYMPANOPLASTY  1950's   removed; left  . VAGINAL HYSTERECTOMY  1980's    MEDICATIONS: Current Outpatient Prescriptions on  File Prior to Visit  Medication Sig Dispense Refill  . ALPRAZolam (XANAX) 0.25 MG tablet Take 1 tablet (0.25 mg total) by mouth 2 (two) times daily as needed for anxiety. (Patient taking differently: Take 0.25-0.5 mg by mouth 2 (two) times daily as needed for anxiety. Take 0.25 mg every morning Take 0.5mg  at bedtime) 90 tablet 0  . amLODipine (NORVASC) 2.5 MG tablet Take 2.5 mg by mouth daily.    . cloNIDine (CATAPRES) 0.1 MG tablet Take 0.1 mg by mouth every 6 (six) hours as needed (for BP levels >170).    . clopidogrel (PLAVIX) 75 MG tablet Take 75 mg by mouth daily.    Marland Kitchen gabapentin (NEURONTIN) 300 MG capsule Take 1 capsule (300 mg  total) by mouth at bedtime. 30 capsule 0  . hydrALAZINE (APRESOLINE) 10 MG tablet Take 10 mg by mouth 3 (three) times daily.    . hydrochlorothiazide (MICROZIDE) 12.5 MG capsule Take 12.5 mg by mouth daily.    Marland Kitchen ibuprofen (ADVIL,MOTRIN) 200 MG tablet Take 400 mg by mouth 2 (two) times daily as needed for mild pain.    Marland Kitchen lisinopril (PRINIVIL,ZESTRIL) 20 MG tablet Take 1 tablet (20 mg total) by mouth daily. (Patient taking differently: Take 30 mg by mouth daily. ) 30 tablet 0  . Multiple Vitamins-Minerals (CERTAGEN PO) Take 1 tablet by mouth daily.    . nitroGLYCERIN (NITROSTAT) 0.4 MG SL tablet Place 0.4 mg under the tongue every 5 (five) minutes as needed for chest pain.    . OXYGEN Inhale 2 L into the lungs daily.    . pantoprazole (PROTONIX) 40 MG tablet Take 20 mg by mouth daily.     . propranolol (INDERAL LA) 160 MG SR capsule Take 160 mg by mouth daily.    Marland Kitchen senna-docusate (SENNA-PLUS) 8.6-50 MG tablet Take 1 tablet by mouth every other day. Taken at bedtime    . sertraline (ZOLOFT) 50 MG tablet Take 50 mg by mouth daily.    . traMADol (ULTRAM) 50 MG tablet Take 50 mg by mouth every 6 (six) hours as needed for moderate pain.    . traZODone (DESYREL) 50 MG tablet Take 50 mg by mouth at bedtime.    . Vitamin D, Ergocalciferol, (DRISDOL) 50000 units CAPS capsule Take 50,000 Units by mouth every 30 (thirty) days. 13th of each month    . benztropine (COGENTIN) 1 MG tablet Take 1 tablet (1 mg total) by mouth 2 (two) times daily. 14 tablet 0  . risperiDONE (RISPERDAL) 0.25 MG tablet Take 1 tablet (0.25 mg total) by mouth 2 (two) times daily. 28 tablet 0   No current facility-administered medications on file prior to visit.     ALLERGIES: Allergies  Allergen Reactions  . Acetaminophen Nausea And Vomiting  . Penicillins Anaphylaxis  . Vicodin [Hydrocodone-Acetaminophen] Nausea And Vomiting  . Delsym [Dextromethorphan Polistirex Er]     FAMILY HISTORY: History reviewed. No pertinent family  history.  SOCIAL HISTORY: Social History   Social History  . Marital status: Married    Spouse name: N/A  . Number of children: N/A  . Years of education: N/A   Occupational History  . Not on file.   Social History Main Topics  . Smoking status: Never Smoker  . Smokeless tobacco: Never Used  . Alcohol use No  . Drug use: No  . Sexual activity: No   Other Topics Concern  . Not on file   Social History Narrative  . No narrative on file  REVIEW OF SYSTEMS: Constitutional: No fevers, chills, or sweats, no generalized fatigue, change in appetite Eyes: No visual changes, double vision, eye pain Ear, nose and throat: No hearing loss, ear pain, nasal congestion, sore throat Cardiovascular: No chest pain, palpitations Respiratory:  No shortness of breath at rest or with exertion, wheezes GastrointestinaI: No nausea, vomiting, diarrhea, abdominal pain, fecal incontinence Genitourinary:  No dysuria, urinary retention or frequency Musculoskeletal:  No neck pain, back pain Integumentary: No rash, pruritus, skin lesions Neurological: as above Psychiatric: No depression, insomnia, anxiety Endocrine: No palpitations, fatigue, diaphoresis, mood swings, change in appetite, change in weight, increased thirst Hematologic/Lymphatic:  No purpura, petechiae. Allergic/Immunologic: no itchy/runny eyes, nasal congestion, recent allergic reactions, rashes  PHYSICAL EXAM: Vitals:  BP 120/80, pulse 80 bpm, O2 sat 98% General: No acute distress.   Head:  Normocephalic/atraumatic Eyes:  fundi examined but not visualized Neck: supple, no paraspinal tenderness, full range of motion Back: No paraspinal tenderness Heart: regular rate and rhythm Lungs: Clear to auscultation bilaterally. Vascular: No carotid bruits. Neurological Exam: Mental status: alert and oriented to person.  Cannot assess orientation to place and time.  Decreased verbal output.  Answers some questions and follows some  commands Cranial nerves: CN I: not tested CN II: pupils equal, round and reactive to light, unable to assess visual fields CN III, IV, VI:  Unable to assess full range of motion, no nystagmus, no ptosis CN V: facial sensation intact CN VII: upper and lower face symmetric CN VIII: hearing reduced bilaterally CN IX, X: gag intact, uvula midline CN XI: sternocleidomastoid and trapezius muscles intact CN XII: tongue midline Bulk & Tone: normal, no fasciculations. Motor:  5/5 throughout.  She exhibits tardive dyskinesia and akathesia Sensation:  Light touch intact Deep Tendon Reflexes:  2+ throughout Finger to nose testing:  Without dysmetria.  Heel to shin:  Unable to assess Gait:  Non-ambulatory  IMPRESSION:1.  Two cerebral aneurysms.  Given her age and comorbidities, conservative management may likely be best option.  2.  Vascular dementia  PLAN: 1.  Repeat MRA of head 2.  Follow up with Dr. Corliss Skains after MRA 3.  Continue blood pressure control.  Thank you for allowing me to take part in the care of this patient.  Shon Millet, DO  CC:  Wilkie Aye, RN

## 2017-01-29 ENCOUNTER — Ambulatory Visit: Payer: Medicare Other | Admitting: Neurology

## 2017-02-06 DEATH — deceased

## 2017-03-11 ENCOUNTER — Ambulatory Visit: Payer: Medicare Other | Admitting: Neurology

## 2017-04-10 ENCOUNTER — Other Ambulatory Visit: Payer: Self-pay

## 2017-04-10 NOTE — Patient Outreach (Signed)
[facility] Care Management  04/10/2017  [name] 07/09/1925 [id number]   Medication Adherence call to [name] patient is showing past due under [employer].on Lisinopril  30 mg call the patient.[name] is at long term home facility at this time.  [name] CPhT Pharmacy Technician [facility] Care Management Direct Dial [phone]  Fax [phone] [email or web address]
